# Patient Record
Sex: Male | Born: 1945 | Race: White | Hispanic: No | Marital: Married | Smoking: Never smoker
Health system: Southern US, Community
[De-identification: ages and names within clinical notes are randomized; demographics above are authoritative.]

## PROBLEM LIST (undated history)

## (undated) DIAGNOSIS — I10 Essential (primary) hypertension: Secondary | ICD-10-CM

## (undated) DIAGNOSIS — I5042 Chronic combined systolic (congestive) and diastolic (congestive) heart failure: Secondary | ICD-10-CM

## (undated) DIAGNOSIS — I255 Ischemic cardiomyopathy: Secondary | ICD-10-CM

## (undated) DIAGNOSIS — N289 Disorder of kidney and ureter, unspecified: Secondary | ICD-10-CM

## (undated) DIAGNOSIS — I219 Acute myocardial infarction, unspecified: Secondary | ICD-10-CM

## (undated) HISTORY — PX: TONSILLECTOMY: SUR1361

## (undated) HISTORY — DX: Ischemic cardiomyopathy: I25.5

## (undated) HISTORY — DX: Chronic combined systolic (congestive) and diastolic (congestive) heart failure: I50.42

---

## 2004-01-14 ENCOUNTER — Ambulatory Visit (HOSPITAL_COMMUNITY): Admission: RE | Admit: 2004-01-14 | Discharge: 2004-01-14 | Payer: Self-pay | Admitting: Gastroenterology

## 2009-09-14 ENCOUNTER — Encounter: Admission: RE | Admit: 2009-09-14 | Discharge: 2009-09-14 | Payer: Self-pay | Admitting: Endocrinology

## 2009-10-23 ENCOUNTER — Emergency Department (HOSPITAL_COMMUNITY): Admission: EM | Admit: 2009-10-23 | Discharge: 2009-10-23 | Payer: Self-pay | Admitting: Emergency Medicine

## 2010-09-19 ENCOUNTER — Ambulatory Visit
Admission: RE | Admit: 2010-09-19 | Discharge: 2010-09-19 | Disposition: A | Discharge status: Home / Self Care | Payer: 59 | Source: Ambulatory Visit | Attending: Endocrinology | Admitting: Endocrinology

## 2010-09-19 ENCOUNTER — Other Ambulatory Visit: Payer: Self-pay | Admitting: Endocrinology

## 2010-09-19 DIAGNOSIS — I10 Essential (primary) hypertension: Secondary | ICD-10-CM

## 2010-10-12 LAB — DIFFERENTIAL
Eosinophils Absolute: 0 10*3/uL (ref 0.0–0.7)
Eosinophils Relative: 1 % (ref 0–5)
Lymphocytes Relative: 17 % (ref 12–46)
Monocytes Absolute: 0.5 10*3/uL (ref 0.1–1.0)
Neutro Abs: 5.7 10*3/uL (ref 1.7–7.7)

## 2010-10-12 LAB — URINE MICROSCOPIC-ADD ON

## 2010-10-12 LAB — POCT I-STAT, CHEM 8
Calcium, Ion: 1.12 mmol/L (ref 1.12–1.32)
Glucose, Bld: 117 mg/dL — ABNORMAL HIGH (ref 70–99)
HCT: 39 % (ref 39.0–52.0)
Hemoglobin: 13.3 g/dL (ref 13.0–17.0)
TCO2: 23 mmol/L (ref 0–100)

## 2010-10-12 LAB — URINALYSIS, ROUTINE W REFLEX MICROSCOPIC
Glucose, UA: NEGATIVE mg/dL
Ketones, ur: NEGATIVE mg/dL
Leukocytes, UA: NEGATIVE
Nitrite: NEGATIVE
Urobilinogen, UA: 0.2 mg/dL (ref 0.0–1.0)
pH: 5.5 (ref 5.0–8.0)

## 2010-10-12 LAB — CBC
Platelets: 211 10*3/uL (ref 150–400)
WBC: 7.6 10*3/uL (ref 4.0–10.5)

## 2010-12-09 NOTE — Op Note (Signed)
NAME:  Anthony Vasquez, WORDELL NO.:  [id number]   MEDICAL RECORD NO.:  [id number]                   PATIENT TYPE:  AMB   LOCATION:  ENDO                                 FACILITY:  [facility]   PHYSICIAN:  Graylin Shiver, M.D.                DATE OF BIRTH:  May 11, 1946   DATE OF PROCEDURE:  01/14/2004  DATE OF DISCHARGE:                                 OPERATIVE REPORT   PROCEDURE PERFORMED:  Colonoscopy.   INDICATIONS FOR PROCEDURE:  Screening.   Informed consent was obtained after explanation of the risks of bleeding,  infection, and perforation.   PREMEDICATIONS:  Fentanyl 70 mcg  IV, Versed 7 mg IV.   DESCRIPTION OF PROCEDURE:  With the patient in the left lateral decubitus  position, a rectal exam was performed and no masses were felt.  The Olympus  colonoscope was inserted into the rectum and advanced around the colon to  the cecum.  Cecal landmarks were identified.  The cecum and ascending colon  were normal.  The transverse colon normal.  The descending colon, sigmoid  and rectum were normal.  The patient tolerated the procedure well without  complications.   IMPRESSION:  Normal colonoscopy to the cecum.   RECOMMENDATIONS:  I would recommend a follow-up screening colonoscopy again  in 10 years.                                               Graylin Shiver, M.D.    Germain Osgood  D:  01/14/2004  T:  01/15/2004  Job:  [id number]   cc:   Veverly Fells. Altheimer, M.D.  [address]  [place]  [place] [address]  Fax: [phone]

## 2012-05-03 ENCOUNTER — Other Ambulatory Visit (HOSPITAL_COMMUNITY): Payer: Self-pay | Admitting: Urology

## 2012-05-03 DIAGNOSIS — N281 Cyst of kidney, acquired: Secondary | ICD-10-CM

## 2012-07-02 ENCOUNTER — Ambulatory Visit (HOSPITAL_COMMUNITY)
Admission: RE | Admit: 2012-07-02 | Discharge: 2012-07-02 | Disposition: A | Discharge status: Home / Self Care | Payer: Medicare Other | Source: Ambulatory Visit | Attending: Urology | Admitting: Urology

## 2012-07-02 DIAGNOSIS — Z87442 Personal history of urinary calculi: Secondary | ICD-10-CM | POA: Insufficient documentation

## 2012-07-02 DIAGNOSIS — N281 Cyst of kidney, acquired: Secondary | ICD-10-CM | POA: Insufficient documentation

## 2012-07-02 DIAGNOSIS — N289 Disorder of kidney and ureter, unspecified: Secondary | ICD-10-CM | POA: Insufficient documentation

## 2012-07-02 DIAGNOSIS — K7689 Other specified diseases of liver: Secondary | ICD-10-CM | POA: Insufficient documentation

## 2012-07-02 LAB — CREATININE, SERUM
Creatinine, Ser: 1.2 mg/dL (ref 0.50–1.35)
GFR calc non Af Amer: 61 mL/min — ABNORMAL LOW (ref >90)

## 2012-07-02 MED ORDER — GADOBENATE DIMEGLUMINE 529 MG/ML IV SOLN
14.0000 mL | Freq: Once | INTRAVENOUS | Status: AC | PRN
  Administered 2012-07-02: 14 mL via INTRAVENOUS

## 2013-07-24 HISTORY — PX: COLONOSCOPY: SHX174

## 2014-09-22 DIAGNOSIS — I219 Acute myocardial infarction, unspecified: Secondary | ICD-10-CM

## 2014-09-22 HISTORY — DX: Acute myocardial infarction, unspecified: I21.9

## 2014-10-07 ENCOUNTER — Inpatient Hospital Stay (HOSPITAL_COMMUNITY)
Admission: EM | Admit: 2014-10-07 | Discharge: 2014-10-16 | DRG: 216 | Disposition: A | Discharge status: Home / Self Care | Payer: Medicare Other | Attending: Cardiothoracic Surgery | Admitting: Cardiothoracic Surgery

## 2014-10-07 ENCOUNTER — Encounter (HOSPITAL_COMMUNITY): Payer: Self-pay | Admitting: Emergency Medicine

## 2014-10-07 ENCOUNTER — Emergency Department (HOSPITAL_COMMUNITY): Payer: Medicare Other

## 2014-10-07 DIAGNOSIS — Z8249 Family history of ischemic heart disease and other diseases of the circulatory system: Secondary | ICD-10-CM

## 2014-10-07 DIAGNOSIS — Z09 Encounter for follow-up examination after completed treatment for conditions other than malignant neoplasm: Secondary | ICD-10-CM

## 2014-10-07 DIAGNOSIS — Z951 Presence of aortocoronary bypass graft: Secondary | ICD-10-CM

## 2014-10-07 DIAGNOSIS — I5042 Chronic combined systolic (congestive) and diastolic (congestive) heart failure: Secondary | ICD-10-CM | POA: Insufficient documentation

## 2014-10-07 DIAGNOSIS — E119 Type 2 diabetes mellitus without complications: Secondary | ICD-10-CM | POA: Diagnosis present

## 2014-10-07 DIAGNOSIS — I25110 Atherosclerotic heart disease of native coronary artery with unstable angina pectoris: Secondary | ICD-10-CM | POA: Diagnosis not present

## 2014-10-07 DIAGNOSIS — N183 Chronic kidney disease, stage 3 (moderate): Secondary | ICD-10-CM | POA: Diagnosis present

## 2014-10-07 DIAGNOSIS — D6959 Other secondary thrombocytopenia: Secondary | ICD-10-CM | POA: Diagnosis not present

## 2014-10-07 DIAGNOSIS — I2510 Atherosclerotic heart disease of native coronary artery without angina pectoris: Secondary | ICD-10-CM | POA: Diagnosis present

## 2014-10-07 DIAGNOSIS — Z79899 Other long term (current) drug therapy: Secondary | ICD-10-CM | POA: Diagnosis not present

## 2014-10-07 DIAGNOSIS — D689 Coagulation defect, unspecified: Secondary | ICD-10-CM | POA: Diagnosis present

## 2014-10-07 DIAGNOSIS — E785 Hyperlipidemia, unspecified: Secondary | ICD-10-CM | POA: Diagnosis present

## 2014-10-07 DIAGNOSIS — I129 Hypertensive chronic kidney disease with stage 1 through stage 4 chronic kidney disease, or unspecified chronic kidney disease: Secondary | ICD-10-CM | POA: Diagnosis present

## 2014-10-07 DIAGNOSIS — D62 Acute posthemorrhagic anemia: Secondary | ICD-10-CM | POA: Diagnosis not present

## 2014-10-07 DIAGNOSIS — I10 Essential (primary) hypertension: Secondary | ICD-10-CM | POA: Diagnosis present

## 2014-10-07 DIAGNOSIS — I214 Non-ST elevation (NSTEMI) myocardial infarction: Secondary | ICD-10-CM | POA: Diagnosis present

## 2014-10-07 DIAGNOSIS — R570 Cardiogenic shock: Secondary | ICD-10-CM | POA: Diagnosis present

## 2014-10-07 DIAGNOSIS — I5021 Acute systolic (congestive) heart failure: Secondary | ICD-10-CM | POA: Diagnosis present

## 2014-10-07 DIAGNOSIS — R079 Chest pain, unspecified: Secondary | ICD-10-CM

## 2014-10-07 HISTORY — DX: Disorder of kidney and ureter, unspecified: N28.9

## 2014-10-07 HISTORY — DX: Acute myocardial infarction, unspecified: I21.9

## 2014-10-07 HISTORY — DX: Essential (primary) hypertension: I10

## 2014-10-07 LAB — BASIC METABOLIC PANEL
Anion gap: 5 (ref 5–15)
BUN: 17 mg/dL (ref 6–23)
CHLORIDE: 110 mmol/L (ref 96–112)
CO2: 27 mmol/L (ref 19–32)
CREATININE: 1.41 mg/dL — AB (ref 0.50–1.35)
Calcium: 9.8 mg/dL (ref 8.4–10.5)
GFR calc Af Amer: 58 mL/min — ABNORMAL LOW (ref >90)
GFR calc non Af Amer: 50 mL/min — ABNORMAL LOW (ref >90)
GLUCOSE: 82 mg/dL (ref 70–99)
Potassium: 3.7 mmol/L (ref 3.5–5.1)
SODIUM: 142 mmol/L (ref 135–145)

## 2014-10-07 LAB — I-STAT TROPONIN, ED: Troponin i, poc: 1.97 ng/mL (ref 0.00–0.08)

## 2014-10-07 LAB — CBC
HEMATOCRIT: 41.7 % (ref 39.0–52.0)
Hemoglobin: 13.6 g/dL (ref 13.0–17.0)
MCH: 28.3 pg (ref 26.0–34.0)
MCHC: 32.6 g/dL (ref 30.0–36.0)
MCV: 86.7 fL (ref 78.0–100.0)
PLATELETS: 265 10*3/uL (ref 150–400)
RBC: 4.81 MIL/uL (ref 4.22–5.81)
RDW: 13.1 % (ref 11.5–15.5)
WBC: 7.7 10*3/uL (ref 4.0–10.5)

## 2014-10-07 LAB — PROTIME-INR
INR: 1.07 (ref 0.00–1.49)
Prothrombin Time: 14.1 seconds (ref 11.6–15.2)

## 2014-10-07 LAB — APTT: aPTT: 29 seconds (ref 24–37)

## 2014-10-07 MED ORDER — ASPIRIN 81 MG PO CHEW: 324.0000 mg | CHEWABLE_TABLET | ORAL | Status: DC

## 2014-10-07 MED ORDER — NITROGLYCERIN 0.4 MG SL SUBL: 0.4000 mg | SUBLINGUAL_TABLET | SUBLINGUAL | Status: DC | PRN

## 2014-10-07 MED ORDER — ONDANSETRON HCL 4 MG/2ML IJ SOLN
4.0000 mg | Freq: Four times a day (QID) | INTRAMUSCULAR | Status: DC | PRN
  Administered 2014-10-08: 4 mg via INTRAVENOUS
  Filled 2014-10-07: qty 2

## 2014-10-07 MED ORDER — HEPARIN BOLUS VIA INFUSION
4000.0000 [IU] | Freq: Once | INTRAVENOUS | Status: AC
  Administered 2014-10-07: 4000 [IU] via INTRAVENOUS
  Filled 2014-10-07: qty 4000

## 2014-10-07 MED ORDER — ASPIRIN 81 MG PO CHEW
324.0000 mg | CHEWABLE_TABLET | Freq: Once | ORAL | Status: AC
  Administered 2014-10-07: 324 mg via ORAL
  Filled 2014-10-07: qty 4

## 2014-10-07 MED ORDER — HEPARIN (PORCINE) IN NACL 100-0.45 UNIT/ML-% IJ SOLN
1050.0000 [IU]/h | INTRAMUSCULAR | Status: DC
  Administered 2014-10-07: 850 [IU]/h via INTRAVENOUS
  Filled 2014-10-07 (×2): qty 250

## 2014-10-07 MED ORDER — ASPIRIN 300 MG RE SUPP: 300.0000 mg | RECTAL | Status: DC

## 2014-10-07 MED ORDER — ASPIRIN EC 81 MG PO TBEC
81.0000 mg | DELAYED_RELEASE_TABLET | Freq: Every day | ORAL | Status: DC
  Filled 2014-10-07: qty 1

## 2014-10-07 MED ORDER — METOPROLOL TARTRATE 12.5 MG HALF TABLET
12.5000 mg | ORAL_TABLET | Freq: Two times a day (BID) | ORAL | Status: DC
  Filled 2014-10-07 (×2): qty 1

## 2014-10-07 MED ORDER — ATORVASTATIN CALCIUM 80 MG PO TABS
80.0000 mg | ORAL_TABLET | Freq: Every day | ORAL | Status: DC
  Administered 2014-10-07 – 2014-10-12 (×4): 80 mg via ORAL
  Filled 2014-10-07 (×7): qty 1

## 2014-10-07 MED ORDER — SODIUM CHLORIDE 0.9 % IV SOLN
INTRAVENOUS | Status: AC
  Administered 2014-10-07 – 2014-10-08 (×2): via INTRAVENOUS

## 2014-10-07 MED ORDER — ACETAMINOPHEN 325 MG PO TABS: 650.0000 mg | ORAL_TABLET | ORAL | Status: DC | PRN

## 2014-10-07 NOTE — ED Notes (Signed)
Patient rates chest pain 1/10 currently and states, "It's like a weight laying there."

## 2014-10-07 NOTE — H&P (Signed)
Anthony Vasquez is an 69 y.o. male.    Chief Complaint: chest pain Primary Cardiologist: new HPI: Anthony Vasquez is a 69 yo man with PMH of hypertension and dyslipidemia who presents with ~ 1 week of increased exercise induced discomfort found to have elevated troponin in the ER consistent with NSTEMI. He actually dates his symptoms to mid February 2016. Anthony Vasquez has noted chest pressure with radiation to his left arm while working in the yard a few times this past week leading to presentation. He currently is chest pain free but presented with some mild light-headedness. In the ER he received a large aspirin and was initiated on a heparin gtt. He denies recent illness. No upcoming surgeries. No bleeding issues.   Past Medical History  Diagnosis Date  . Hypertension   . Renal disorder     History reviewed. No pertinent past surgical history. Hypertension, Dyslipidemia No family history on file. Social History:  reports that he has never smoked. He does not have any smokeless tobacco history on file. He reports that he does not drink alcohol. His drug history is not on file. Mother with CHF, died in her 72s, father with known CAD, alive in 63s Allergies: No Known Allergies   (Not in a hospital admission)  Results for orders placed or performed during the hospital encounter of 10/07/14 (from the past 48 hour(s))  CBC     Status: None   Collection Time: 10/07/14  5:18 PM  Result Value Ref Range   WBC 7.7 4.0 - 10.5 K/uL   RBC 4.81 4.22 - 5.81 MIL/uL   Hemoglobin 13.6 13.0 - 17.0 g/dL   HCT 41.7 39.0 - 52.0 %   MCV 86.7 78.0 - 100.0 fL   MCH 28.3 26.0 - 34.0 pg   MCHC 32.6 30.0 - 36.0 g/dL   RDW 13.1 11.5 - 15.5 %   Platelets 265 150 - 400 K/uL  Basic metabolic panel     Status: Abnormal   Collection Time: 10/07/14  5:18 PM  Result Value Ref Range   Sodium 142 135 - 145 mmol/L   Potassium 3.7 3.5 - 5.1 mmol/L   Chloride 110 96 - 112 mmol/L   CO2 27 19 - 32 mmol/L   Glucose,  Bld 82 70 - 99 mg/dL   BUN 17 6 - 23 mg/dL   Creatinine, Ser 1.41 (H) 0.50 - 1.35 mg/dL   Calcium 9.8 8.4 - 10.5 mg/dL   GFR calc non Af Amer 50 (L) >90 mL/min   GFR calc Af Amer 58 (L) >90 mL/min    Comment: (NOTE) The eGFR has been calculated using the CKD EPI equation. This calculation has not been validated in all clinical situations. eGFR's persistently <90 mL/min signify possible Chronic Kidney Disease.    Anion gap 5 5 - 15  I-stat troponin, ED (not at [facility])     Status: Abnormal   Collection Time: 10/07/14  5:41 PM  Result Value Ref Range   Troponin i, poc 1.97 (HH) 0.00 - 0.08 ng/mL   Comment NOTIFIED PHYSICIAN    Comment 3            Comment: Due to the release kinetics of cTnI, a negative result within the first hours of the onset of symptoms does not rule out myocardial infarction with certainty. If myocardial infarction is still suspected, repeat the test at appropriate intervals.    Dg Chest 2 View  10/07/2014   CLINICAL DATA:  Four day  history of dizziness and chest pain  EXAM: CHEST  2 VIEW  COMPARISON:  September 19, 2010  FINDINGS: There is minimal scarring left base. There is no edema or consolidation. Heart size and pulmonary vascularity are normal. No adenopathy. No pneumothorax. No bone lesions.  IMPRESSION: Minimal scarring left base.  No edema or consolidation.   Electronically Signed   By: Lowella Grip III M.D.   On: 10/07/2014 18:44    Review of Systems  Constitutional: Negative for fever, chills and weight loss.  HENT: Negative for ear discharge.   Eyes: Negative for double vision and pain.  Respiratory: Negative for cough and hemoptysis.   Cardiovascular: Positive for chest pain. Negative for palpitations and orthopnea.  Gastrointestinal: Negative for nausea and vomiting.  Genitourinary: Negative for dysuria and urgency.  Musculoskeletal: Negative for myalgias, back pain and neck pain.  Skin: Negative for rash.  Neurological: Positive for  dizziness. Negative for sensory change, speech change and headaches.  Endo/Heme/Allergies: Negative for polydipsia. Does not bruise/bleed easily.  Psychiatric/Behavioral: Negative for depression, suicidal ideas and substance abuse.    Blood pressure 109/76, pulse 65, temperature 98.6 F (37 C), temperature source Oral, resp. rate 16, height $RemoveBe'5\' 6"'yiUZaCtPL$  (1.676 m), weight 70.308 kg (155 lb), SpO2 93 %. Physical Exam  Nursing note and vitals reviewed. Constitutional: He is oriented to person, place, and time. He appears well-developed and well-nourished. No distress.  HENT:  Head: Normocephalic and atraumatic.  Nose: Nose normal.  Mouth/Throat: Oropharynx is clear and moist. No oropharyngeal exudate.  Eyes: Conjunctivae and EOM are normal. Pupils are equal, round, and reactive to light. No scleral icterus.  Neck: Normal range of motion. Neck supple. No JVD present. No tracheal deviation present.  Cardiovascular: Normal rate, regular rhythm, normal heart sounds and intact distal pulses.  Exam reveals no gallop.   No murmur heard. Respiratory: Effort normal and breath sounds normal. No respiratory distress. He has no wheezes. He has no rales.  GI: Soft. Bowel sounds are normal. He exhibits no distension. There is no tenderness. There is no rebound.  Musculoskeletal: Normal range of motion. He exhibits no edema or tenderness.  Neurological: He is alert and oriented to person, place, and time. No cranial nerve deficit. Coordination normal.  Skin: Skin is warm and dry. No rash noted. He is not diaphoretic. No erythema.  Psychiatric: He has a normal mood and affect. His behavior is normal. Thought content normal.    EKG reviewed; sinus rhythm, anterior infarct - age indeterminate, inferolateral ST depression consistent with ischemia Labs reviewed; Cr 1.4, Trop 1.97 Chest x-ray: no acute process Assessment/Plan Problem List Chest Pain/NSTEMI Hypertension Dyslipidemia Mildly elevated creatinine Mr.  Vasquez is a 69 yo man with HTN, HLD here with chest discomfort and found to have likely NSTEMI. Differential diagnosis is musculoskeletal pain, esophageal spasm, GERD, pericarditis, ACS/NSTEMI among other etiologies. I favor a diagnosis of type I NSTEMI and plan for LHC in AM with likely need for PCI.  - NPO after MN - trend cardiac markers - observation on telemetry - asa 81 mg, metoprolol 12.5 mg bid, atorvastatin first dose now 80 mg - heparin gtt - hba1c, tsh, lipid panel, BNP - 75 ml NS/hr x 1L   Santiana Glidden 10/07/2014, 9:04 PM

## 2014-10-07 NOTE — ED Notes (Signed)
Dr Ethelda Chick notified of patient lab result.

## 2014-10-07 NOTE — ED Provider Notes (Signed)
CSN: [id number]     Arrival date & time 10/07/14  1704 History   First MD Initiated Contact with Patient 10/07/14 1758     Chief Complaint  Patient presents with  . Chest Pain  . Dizziness     (Consider location/radiation/quality/duration/timing/severity/associated sxs/prior Treatment) HPI Complains of anterior chest pain rating to left arm onset while doing yard work 4 days ago and again 2 days ago. This morning he complains of mild lightheadedness. He is presently asymptomatic except for mild lightheadedness. No other associated symptoms. No treatment prior to coming here. Symptoms worse with exertion and improved with rest Past Medical History  Diagnosis Date  . Renal disorder   . Hypertension    hypercholesterolemia History reviewed. No pertinent past surgical history. No family history on file. History  Substance Use Topics  . Smoking status: Never Smoker   . Smokeless tobacco: Not on file  . Alcohol Use: No    Review of Systems  Constitutional: Negative.   HENT: Negative.   Respiratory: Negative.   Cardiovascular: Positive for chest pain.  Gastrointestinal: Negative.   Musculoskeletal: Negative.   Skin: Negative.   Neurological: Positive for light-headedness.  Psychiatric/Behavioral: Negative.   All other systems reviewed and are negative.     Allergies  Review of patient's allergies indicates not on file.  Home Medications   Prior to Admission medications   Not on File   BP 120/81 mmHg  Pulse 74  Temp(Src) 98.6 F (37 C) (Oral)  Resp 18  SpO2 98% Physical Exam  Constitutional: He appears well-developed and well-nourished.  HENT:  Head: Normocephalic and atraumatic.  Eyes: Conjunctivae are normal. Pupils are equal, round, and reactive to light.  Neck: Neck supple. No tracheal deviation present. No thyromegaly present.  Cardiovascular: Normal rate and regular rhythm.   No murmur heard. Pulmonary/Chest: Effort normal and breath sounds normal.   Abdominal: Soft. Bowel sounds are normal. He exhibits no distension. There is no tenderness.  Musculoskeletal: Normal range of motion. He exhibits no edema or tenderness.  Neurological: He is alert. Coordination normal.  Skin: Skin is warm and dry. No rash noted.  Psychiatric: He has a normal mood and affect.  Nursing note and vitals reviewed.   ED Course  Procedures (including critical care time) Labs Review Labs Reviewed  I-STAT TROPOININ, ED - Abnormal; Notable for the following:    Troponin i, poc 1.97 (*)    All other components within normal limits  CBC  BASIC METABOLIC PANEL    Imaging Review No results found.   EKG Interpretation   Date/Time:  Wednesday October 07 2014 17:13:29 EDT Ventricular Rate:  74 PR Interval:  173 QRS Duration: 94 QT Interval:  408 QTC Calculation: 453 R Axis:   -2 Text Interpretation:  Sinus rhythm LVH with secondary repolarization  abnormality Anterior infarct, old No old tracing to compare Confirmed by  Idalie Canto  MD, Baylon Santelli [phone]) on 10/07/2014 5:57:41 PM     8:50 PM patient remains asymptomatic, pain-free. Chest xray viewed by me Results for orders placed or performed during the hospital encounter of 10/07/14  CBC  Result Value Ref Range   WBC 7.7 4.0 - 10.5 K/uL   RBC 4.81 4.22 - 5.81 MIL/uL   Hemoglobin 13.6 13.0 - 17.0 g/dL   HCT 19.1 47.8 - 29.5 %   MCV 86.7 78.0 - 100.0 fL   MCH 28.3 26.0 - 34.0 pg   MCHC 32.6 30.0 - 36.0 g/dL   RDW 62.1 30.8 - 65.7 %  Platelets 265 150 - 400 K/uL  Basic metabolic panel  Result Value Ref Range   Sodium 142 135 - 145 mmol/L   Potassium 3.7 3.5 - 5.1 mmol/L   Chloride 110 96 - 112 mmol/L   CO2 27 19 - 32 mmol/L   Glucose, Bld 82 70 - 99 mg/dL   BUN 17 6 - 23 mg/dL   Creatinine, Ser 9.79 (H) 0.50 - 1.35 mg/dL   Calcium 9.8 8.4 - 48.0 mg/dL   GFR calc non Af Amer 50 (L) >90 mL/min   GFR calc Af Amer 58 (L) >90 mL/min   Anion gap 5 5 - 15  I-stat troponin, ED (not at [facility])  Result  Value Ref Range   Troponin i, poc 1.97 (HH) 0.00 - 0.08 ng/mL   Comment NOTIFIED PHYSICIAN    Comment 3           Dg Chest 2 View  10/07/2014   CLINICAL DATA:  Four day history of dizziness and chest pain  EXAM: CHEST  2 VIEW  COMPARISON:  September 19, 2010  FINDINGS: There is minimal scarring left base. There is no edema or consolidation. Heart size and pulmonary vascularity are normal. No adenopathy. No pneumothorax. No bone lesions.  IMPRESSION: Minimal scarring left base.  No edema or consolidation.   Electronically Signed   By: Bretta Bang III M.D.   On: 10/07/2014 18:44    MDM  Spoke with Dr.Kelly. Plan transfer [facility] telemetry unit. Intravenous heparin patient minister aspirin Final diagnoses:  Chest pain   diagnosis # NSTEMI #2 renal insufficiency      Doug Sou, MD 10/07/14 1655

## 2014-10-07 NOTE — ED Notes (Signed)
Bed: WA23 Expected date:  Expected time:  Means of arrival:  Comments: tr5 

## 2014-10-07 NOTE — ED Notes (Signed)
Patient presents to the ED from home with complaints of chest pain intermittent since February, worsening for 4 days.  Patient states exertion makes the pain worse and rest makes pain better.  Patient states he has also been intermittently dizzy since Monday.  Patient states any form of exertion increases substernal chest pain.  Patient denies SOB.  Patient endorses some mild nausea today, but denies vomiting.  Patient rates pain 1.5/10 currently.  Patient states he "just doesn't feel right."  On exam, patients lung sounds are clear in all lobes, no wheezing, rhonchi or rales.  Heart sounds WNL, S1/S2, RR, no gallop, murmur or rub.  +3 radial and pedal pulses.  No pre-tibial and pedal edema.  Patients abdomen is soft and non-tender to palpation.  Bowel sounds present.  CNIII and EOM intact.  Skin warm and dry.

## 2014-10-07 NOTE — ED Notes (Signed)
Dr Shela Commons at bedside, pt nonsymptomatic, pt denies chest pain today. Reports last time he had chest pain was Monday.

## 2014-10-07 NOTE — Progress Notes (Signed)
ANTICOAGULATION CONSULT NOTE - Initial Consult  Pharmacy Consult for Heparin Indication: chest pain/ACS  No Known Allergies  Patient Measurements: Height: 5\' 6"  (167.6 cm) Weight: 155 lb (70.308 kg) IBW/kg (Calculated) : 63.8 Heparin Dosing Weight: actual weight  Vital Signs: Temp: 98.6 F (37 C) (03/16 1710) Temp Source: Oral (03/16 1710) BP: 127/76 mmHg (03/16 2000) Pulse Rate: 73 (03/16 2000)  Labs:  Recent Labs  10/07/14 1718  HGB 13.6  HCT 41.7  PLT 265  CREATININE 1.41*    CrCl cannot be calculated (Unknown ideal weight.).   Medical History: Past Medical History  Diagnosis Date  . Hypertension   . Renal disorder     Medications:  Scheduled:  Infusions:   Assessment: 31 yoM [facility] ED for dizziness and exertional chest pain with concern for ACS.  PMH is significant for renal d/o, HTN, HLD.  Pharmacy is consulted to dose IV heparin for NSTEMI.   Baseline coags: pending collection  Troponin: 1.97  CBC: Hgb 13.6, Plt 265   Goal of Therapy:  Heparin level 0.3-0.7 units/ml Monitor platelets by anticoagulation protocol: Yes   Plan:   Baseline PTT, PT/INR  Give heparin 4000 units bolus IV x 1  Start heparin IV infusion at 850 units/hr  Heparin level 6 hours after starting  Daily heparin level and CBC  Lynann Beaver PharmD, BCPS Pager [phone] 10/07/2014 8:50 PM

## 2014-10-07 NOTE — ED Notes (Signed)
Pt c/o intermittent chest pain and dizziness since Saturday. Pt sts the pain is worse when doing any physical activity. When he has noticed it the most, he has been outside working in the yard. Pt sts this chest pain has been going on for "a few months."  Pt denies pain radiation at this time but sts that over the past few months it has radiated to the jaw and left arm. 1.5/10 pain at this time.

## 2014-10-08 ENCOUNTER — Inpatient Hospital Stay (HOSPITAL_COMMUNITY): Payer: Medicare Other | Admitting: Certified Registered Nurse Anesthetist

## 2014-10-08 ENCOUNTER — Encounter (HOSPITAL_COMMUNITY)
Admission: EM | Disposition: A | Discharge status: Home / Self Care | Payer: Self-pay | Source: Home / Self Care | Attending: Cardiothoracic Surgery

## 2014-10-08 ENCOUNTER — Encounter (HOSPITAL_COMMUNITY): Payer: Self-pay | Admitting: General Practice

## 2014-10-08 ENCOUNTER — Encounter (HOSPITAL_COMMUNITY)
Admission: EM | Disposition: A | Discharge status: Home / Self Care | Source: Home / Self Care | Attending: Cardiothoracic Surgery

## 2014-10-08 DIAGNOSIS — R570 Cardiogenic shock: Secondary | ICD-10-CM | POA: Insufficient documentation

## 2014-10-08 DIAGNOSIS — I2510 Atherosclerotic heart disease of native coronary artery without angina pectoris: Secondary | ICD-10-CM

## 2014-10-08 DIAGNOSIS — I214 Non-ST elevation (NSTEMI) myocardial infarction: Principal | ICD-10-CM

## 2014-10-08 DIAGNOSIS — I5042 Chronic combined systolic (congestive) and diastolic (congestive) heart failure: Secondary | ICD-10-CM | POA: Insufficient documentation

## 2014-10-08 DIAGNOSIS — I25110 Atherosclerotic heart disease of native coronary artery with unstable angina pectoris: Secondary | ICD-10-CM

## 2014-10-08 DIAGNOSIS — I5021 Acute systolic (congestive) heart failure: Secondary | ICD-10-CM | POA: Insufficient documentation

## 2014-10-08 HISTORY — PX: LEFT HEART CATHETERIZATION WITH CORONARY ANGIOGRAM: SHX5451

## 2014-10-08 HISTORY — PX: TEE WITHOUT CARDIOVERSION: SHX5443

## 2014-10-08 HISTORY — PX: CORONARY ARTERY BYPASS GRAFT: SHX141

## 2014-10-08 LAB — POCT I-STAT, CHEM 8
BUN: 16 mg/dL (ref 6–23)
BUN: 11 mg/dL (ref 6–23)
BUN: 13 mg/dL (ref 6–23)
BUN: 13 mg/dL (ref 6–23)
BUN: 12 mg/dL (ref 6–23)
BUN: 13 mg/dL (ref 6–23)
BUN: 13 mg/dL (ref 6–23)
BUN: 12 mg/dL (ref 6–23)
CALCIUM ION: 1.13 mmol/L (ref 1.13–1.30)
CALCIUM ION: 0.95 mmol/L — AB (ref 1.13–1.30)
CALCIUM ION: 0.96 mmol/L — AB (ref 1.13–1.30)
CALCIUM ION: 1.03 mmol/L — AB (ref 1.13–1.30)
CHLORIDE: 107 mmol/L (ref 96–112)
CHLORIDE: 99 mmol/L (ref 96–112)
CHLORIDE: 101 mmol/L (ref 96–112)
CREATININE: 1.3 mg/dL (ref 0.50–1.35)
CREATININE: 1.2 mg/dL (ref 0.50–1.35)
CREATININE: 1.2 mg/dL (ref 0.50–1.35)
CREATININE: 1.2 mg/dL (ref 0.50–1.35)
Calcium, Ion: 1.23 mmol/L (ref 1.13–1.30)
Calcium, Ion: 1.36 mmol/L — ABNORMAL HIGH (ref 1.13–1.30)
Calcium, Ion: 0.89 mmol/L — ABNORMAL LOW (ref 1.13–1.30)
Calcium, Ion: 1.08 mmol/L — ABNORMAL LOW (ref 1.13–1.30)
Chloride: 106 mmol/L (ref 96–112)
Chloride: 108 mmol/L (ref 96–112)
Chloride: 98 mmol/L (ref 96–112)
Chloride: 99 mmol/L (ref 96–112)
Chloride: 101 mmol/L (ref 96–112)
Creatinine, Ser: 1 mg/dL (ref 0.50–1.35)
Creatinine, Ser: 1.2 mg/dL (ref 0.50–1.35)
Creatinine, Ser: 1.2 mg/dL (ref 0.50–1.35)
Creatinine, Ser: 1.2 mg/dL (ref 0.50–1.35)
GLUCOSE: 287 mg/dL — AB (ref 70–99)
Glucose, Bld: 146 mg/dL — ABNORMAL HIGH (ref 70–99)
Glucose, Bld: 139 mg/dL — ABNORMAL HIGH (ref 70–99)
Glucose, Bld: 146 mg/dL — ABNORMAL HIGH (ref 70–99)
Glucose, Bld: 150 mg/dL — ABNORMAL HIGH (ref 70–99)
Glucose, Bld: 313 mg/dL — ABNORMAL HIGH (ref 70–99)
Glucose, Bld: 337 mg/dL — ABNORMAL HIGH (ref 70–99)
Glucose, Bld: 348 mg/dL — ABNORMAL HIGH (ref 70–99)
HCT: 17 % — ABNORMAL LOW (ref 39.0–52.0)
HCT: 28 % — ABNORMAL LOW (ref 39.0–52.0)
HCT: 31 % — ABNORMAL LOW (ref 39.0–52.0)
HCT: 22 % — ABNORMAL LOW (ref 39.0–52.0)
HCT: 25 % — ABNORMAL LOW (ref 39.0–52.0)
HCT: 27 % — ABNORMAL LOW (ref 39.0–52.0)
HEMATOCRIT: 41 % (ref 39.0–52.0)
HEMATOCRIT: 22 % — AB (ref 39.0–52.0)
Hemoglobin: 13.9 g/dL (ref 13.0–17.0)
Hemoglobin: 10.5 g/dL — ABNORMAL LOW (ref 13.0–17.0)
Hemoglobin: 5.8 g/dL — CL (ref 13.0–17.0)
Hemoglobin: 9.5 g/dL — ABNORMAL LOW (ref 13.0–17.0)
Hemoglobin: 7.5 g/dL — ABNORMAL LOW (ref 13.0–17.0)
Hemoglobin: 7.5 g/dL — ABNORMAL LOW (ref 13.0–17.0)
Hemoglobin: 8.5 g/dL — ABNORMAL LOW (ref 13.0–17.0)
Hemoglobin: 9.2 g/dL — ABNORMAL LOW (ref 13.0–17.0)
POTASSIUM: 3.7 mmol/L (ref 3.5–5.1)
POTASSIUM: 3.3 mmol/L — AB (ref 3.5–5.1)
POTASSIUM: 3.6 mmol/L (ref 3.5–5.1)
POTASSIUM: 4.9 mmol/L (ref 3.5–5.1)
POTASSIUM: 3.5 mmol/L (ref 3.5–5.1)
Potassium: 3.8 mmol/L (ref 3.5–5.1)
Potassium: 3.9 mmol/L (ref 3.5–5.1)
Potassium: 4.7 mmol/L (ref 3.5–5.1)
SODIUM: 137 mmol/L (ref 135–145)
SODIUM: 143 mmol/L (ref 135–145)
SODIUM: 136 mmol/L (ref 135–145)
Sodium: 143 mmol/L (ref 135–145)
Sodium: 142 mmol/L (ref 135–145)
Sodium: 133 mmol/L — ABNORMAL LOW (ref 135–145)
Sodium: 134 mmol/L — ABNORMAL LOW (ref 135–145)
Sodium: 140 mmol/L (ref 135–145)
TCO2: 20 mmol/L (ref 0–100)
TCO2: 19 mmol/L (ref 0–100)
TCO2: 19 mmol/L (ref 0–100)
TCO2: 20 mmol/L (ref 0–100)
TCO2: 17 mmol/L (ref 0–100)
TCO2: 17 mmol/L (ref 0–100)
TCO2: 20 mmol/L (ref 0–100)
TCO2: 19 mmol/L (ref 0–100)

## 2014-10-08 LAB — ABO/RH: ABO/RH(D): A POS

## 2014-10-08 LAB — BASIC METABOLIC PANEL
Anion gap: 11 (ref 5–15)
BUN: 13 mg/dL (ref 6–23)
CO2: 21 mmol/L (ref 19–32)
Calcium: 8.8 mg/dL (ref 8.4–10.5)
Chloride: 110 mmol/L (ref 96–112)
Creatinine, Ser: 1.3 mg/dL (ref 0.50–1.35)
GFR, EST AFRICAN AMERICAN: 64 mL/min — AB (ref >90)
GFR, EST NON AFRICAN AMERICAN: 55 mL/min — AB (ref >90)
Glucose, Bld: 92 mg/dL (ref 70–99)
Potassium: 3.5 mmol/L (ref 3.5–5.1)
SODIUM: 142 mmol/L (ref 135–145)

## 2014-10-08 LAB — COMPREHENSIVE METABOLIC PANEL
ALK PHOS: 51 U/L (ref 39–117)
ALT: 24 U/L (ref 0–53)
AST: 33 U/L (ref 0–37)
Albumin: 3.5 g/dL (ref 3.5–5.2)
Anion gap: 8 (ref 5–15)
BUN: 12 mg/dL (ref 6–23)
CO2: 24 mmol/L (ref 19–32)
CREATININE: 1.38 mg/dL — AB (ref 0.50–1.35)
Calcium: 9.1 mg/dL (ref 8.4–10.5)
Chloride: 111 mmol/L (ref 96–112)
GFR, EST AFRICAN AMERICAN: 59 mL/min — AB (ref >90)
GFR, EST NON AFRICAN AMERICAN: 51 mL/min — AB (ref >90)
GLUCOSE: 91 mg/dL (ref 70–99)
POTASSIUM: 3.5 mmol/L (ref 3.5–5.1)
SODIUM: 143 mmol/L (ref 135–145)
TOTAL PROTEIN: 6.1 g/dL (ref 6.0–8.3)
Total Bilirubin: 0.8 mg/dL (ref 0.3–1.2)

## 2014-10-08 LAB — CBC
HCT: 39.3 % (ref 39.0–52.0)
Hemoglobin: 13 g/dL (ref 13.0–17.0)
MCH: 28.2 pg (ref 26.0–34.0)
MCHC: 33.1 g/dL (ref 30.0–36.0)
MCV: 85.2 fL (ref 78.0–100.0)
PLATELETS: 229 10*3/uL (ref 150–400)
RBC: 4.61 MIL/uL (ref 4.22–5.81)
RDW: 13.2 % (ref 11.5–15.5)
WBC: 8.5 10*3/uL (ref 4.0–10.5)

## 2014-10-08 LAB — POCT I-STAT 3, ART BLOOD GAS (G3+)
ACID-BASE DEFICIT: 3 mmol/L — AB (ref 0.0–2.0)
ACID-BASE DEFICIT: 8 mmol/L — AB (ref 0.0–2.0)
Acid-base deficit: 4 mmol/L — ABNORMAL HIGH (ref 0.0–2.0)
Bicarbonate: 22.6 mEq/L (ref 20.0–24.0)
Bicarbonate: 18.6 mEq/L — ABNORMAL LOW (ref 20.0–24.0)
Bicarbonate: 21.9 mEq/L (ref 20.0–24.0)
O2 SAT: 100 %
O2 SAT: 96 %
O2 Saturation: 95 %
PH ART: 7.35 (ref 7.350–7.450)
PH ART: 7.269 — AB (ref 7.350–7.450)
TCO2: 24 mmol/L (ref 0–100)
TCO2: 20 mmol/L (ref 0–100)
TCO2: 23 mmol/L (ref 0–100)
pCO2 arterial: 40.9 mmHg (ref 35.0–45.0)
pCO2 arterial: 40.6 mmHg (ref 35.0–45.0)
pCO2 arterial: 41.3 mmHg (ref 35.0–45.0)
pH, Arterial: 7.332 — ABNORMAL LOW (ref 7.350–7.450)
pO2, Arterial: 271 mmHg — ABNORMAL HIGH (ref 80.0–100.0)
pO2, Arterial: 84 mmHg (ref 80.0–100.0)
pO2, Arterial: 90 mmHg (ref 80.0–100.0)

## 2014-10-08 LAB — TROPONIN I
Troponin I: 2.68 ng/mL (ref <0.031)
Troponin I: 3.53 ng/mL (ref <0.031)
Troponin I: 2.79 ng/mL (ref <0.031)

## 2014-10-08 LAB — LIPID PANEL
Cholesterol: 143 mg/dL (ref 0–200)
HDL: 36 mg/dL — ABNORMAL LOW (ref >39)
LDL CALC: 89 mg/dL (ref 0–99)
Total CHOL/HDL Ratio: 4 RATIO
Triglycerides: 88 mg/dL (ref <150)
VLDL: 18 mg/dL (ref 0–40)

## 2014-10-08 LAB — HEPARIN LEVEL (UNFRACTIONATED): Heparin Unfractionated: 0.15 IU/mL — ABNORMAL LOW (ref 0.30–0.70)

## 2014-10-08 LAB — POCT ACTIVATED CLOTTING TIME
Activated Clotting Time: 202 seconds
Activated Clotting Time: 263 seconds

## 2014-10-08 LAB — PROTIME-INR
INR: 1.15 (ref 0.00–1.49)
PROTHROMBIN TIME: 14.9 s (ref 11.6–15.2)

## 2014-10-08 LAB — HEMOGLOBIN AND HEMATOCRIT, BLOOD
HCT: 24.7 % — ABNORMAL LOW (ref 39.0–52.0)
Hemoglobin: 8.6 g/dL — ABNORMAL LOW (ref 13.0–17.0)

## 2014-10-08 LAB — POCT I-STAT GLUCOSE
GLUCOSE: 310 mg/dL — AB (ref 70–99)
Glucose, Bld: 319 mg/dL — ABNORMAL HIGH (ref 70–99)
OPERATOR ID: 3406
Operator id: 3406

## 2014-10-08 LAB — PLATELET COUNT: Platelets: 118 10*3/uL — ABNORMAL LOW (ref 150–400)

## 2014-10-08 LAB — PREPARE RBC (CROSSMATCH)

## 2014-10-08 LAB — BRAIN NATRIURETIC PEPTIDE: B Natriuretic Peptide: 426.5 pg/mL — ABNORMAL HIGH (ref 0.0–100.0)

## 2014-10-08 LAB — TSH: TSH: 3.043 u[IU]/mL (ref 0.350–4.500)

## 2014-10-08 SURGERY — LEFT HEART CATHETERIZATION WITH CORONARY ANGIOGRAM
Anesthesia: LOCAL

## 2014-10-08 SURGERY — CORONARY ARTERY BYPASS GRAFTING (CABG)
Anesthesia: General | Site: Chest

## 2014-10-08 MED ORDER — PHENYLEPHRINE HCL 10 MG/ML IJ SOLN
10.0000 mg | INTRAVENOUS | Status: DC | PRN
  Administered 2014-10-08: 50 ug/min via INTRAVENOUS

## 2014-10-08 MED ORDER — FENTANYL CITRATE 0.05 MG/ML IJ SOLN
INTRAMUSCULAR | Status: DC | PRN
  Administered 2014-10-08 (×2): 150 ug via INTRAVENOUS
  Administered 2014-10-08: 50 ug via INTRAVENOUS
  Administered 2014-10-08: 100 ug via INTRAVENOUS
  Administered 2014-10-08: 500 ug via INTRAVENOUS
  Administered 2014-10-08: 100 ug via INTRAVENOUS
  Administered 2014-10-08: 300 ug via INTRAVENOUS
  Administered 2014-10-09: 150 ug via INTRAVENOUS

## 2014-10-08 MED ORDER — LIDOCAINE HCL (CARDIAC) 20 MG/ML IV SOLN
INTRAVENOUS | Status: AC
  Filled 2014-10-08: qty 5

## 2014-10-08 MED ORDER — SODIUM CHLORIDE 0.9 % IV SOLN
250.0000 mL | INTRAVENOUS | Status: DC | PRN
Start: 2014-10-08 — End: 2014-10-08
  Administered 2014-10-08 (×3): via INTRAVENOUS

## 2014-10-08 MED ORDER — AMINOCAPROIC ACID 250 MG/ML IV SOLN
INTRAVENOUS | Status: DC
  Filled 2014-10-08: qty 40

## 2014-10-08 MED ORDER — DEXTROSE 5 % IV SOLN
1.5000 g | INTRAVENOUS | Status: AC
  Administered 2014-10-08: .75 g via INTRAVENOUS
  Administered 2014-10-08: 1.5 g via INTRAVENOUS
  Filled 2014-10-08: qty 1.5

## 2014-10-08 MED ORDER — SODIUM BICARBONATE 8.4 % IV SOLN
INTRAVENOUS | Status: DC | PRN
  Administered 2014-10-08: 50 meq via INTRAVENOUS

## 2014-10-08 MED ORDER — 0.9 % SODIUM CHLORIDE (POUR BTL) OPTIME
TOPICAL | Status: DC | PRN
  Administered 2014-10-08: 1000 mL

## 2014-10-08 MED ORDER — NOREPINEPHRINE BITARTRATE 1 MG/ML IV SOLN
INTRAVENOUS | Status: AC
  Filled 2014-10-08: qty 4

## 2014-10-08 MED ORDER — PLASMA-LYTE 148 IV SOLN
INTRAVENOUS | Status: DC | PRN
  Administered 2014-10-08: 500 mL via INTRAVASCULAR

## 2014-10-08 MED ORDER — SODIUM CHLORIDE 0.9 % IJ SOLN
INTRAMUSCULAR | Status: DC | PRN
  Administered 2014-10-08 (×4): 1 mL via TOPICAL

## 2014-10-08 MED ORDER — PHENYLEPHRINE 40 MCG/ML (10ML) SYRINGE FOR IV PUSH (FOR BLOOD PRESSURE SUPPORT)
PREFILLED_SYRINGE | INTRAVENOUS | Status: AC
  Filled 2014-10-08: qty 10

## 2014-10-08 MED ORDER — FENTANYL CITRATE 0.05 MG/ML IJ SOLN
INTRAMUSCULAR | Status: AC
  Filled 2014-10-08: qty 5

## 2014-10-08 MED ORDER — MIDAZOLAM HCL 5 MG/5ML IJ SOLN
INTRAMUSCULAR | Status: DC | PRN
  Administered 2014-10-08 (×2): 3 mg via INTRAVENOUS
  Administered 2014-10-08: 4 mg via INTRAVENOUS

## 2014-10-08 MED ORDER — ALBUMIN HUMAN 5 % IV SOLN
INTRAVENOUS | Status: DC | PRN
  Administered 2014-10-08 (×3): via INTRAVENOUS

## 2014-10-08 MED ORDER — ETOMIDATE 2 MG/ML IV SOLN
INTRAVENOUS | Status: DC | PRN
  Administered 2014-10-08: 6 mg via INTRAVENOUS

## 2014-10-08 MED ORDER — DEXTROSE 5 % IV SOLN
0.0000 ug/min | INTRAVENOUS | Status: DC
  Filled 2014-10-08: qty 4

## 2014-10-08 MED ORDER — POTASSIUM CHLORIDE 2 MEQ/ML IV SOLN
80.0000 meq | INTRAVENOUS | Status: DC
  Filled 2014-10-08: qty 40

## 2014-10-08 MED ORDER — HEMOSTATIC AGENTS (NO CHARGE) OPTIME
TOPICAL | Status: DC | PRN
  Administered 2014-10-08: 1 via TOPICAL

## 2014-10-08 MED ORDER — HEPARIN SODIUM (PORCINE) 1000 UNIT/ML IJ SOLN
INTRAMUSCULAR | Status: AC
  Filled 2014-10-08: qty 1

## 2014-10-08 MED ORDER — LACTATED RINGERS IV SOLN
INTRAVENOUS | Status: DC | PRN
  Administered 2014-10-08 (×2): via INTRAVENOUS

## 2014-10-08 MED ORDER — MILRINONE IN DEXTROSE 20 MG/100ML IV SOLN
INTRAVENOUS | Status: DC | PRN
  Administered 2014-10-08: .2 ug/kg/min via INTRAVENOUS

## 2014-10-08 MED ORDER — SODIUM CHLORIDE 0.9 % IV SOLN
20.0000 ug | INTRAVENOUS | Status: DC | PRN
  Administered 2014-10-08: 22.2 ug via INTRAVENOUS

## 2014-10-08 MED ORDER — PROTAMINE SULFATE 10 MG/ML IV SOLN
INTRAVENOUS | Status: DC | PRN
  Administered 2014-10-08: 50 mg via INTRAVENOUS
  Administered 2014-10-08: 40 mg via INTRAVENOUS
  Administered 2014-10-08: 20 mg via INTRAVENOUS
  Administered 2014-10-08: 10 mg via INTRAVENOUS
  Administered 2014-10-08: 30 mg via INTRAVENOUS
  Administered 2014-10-08: 20 mg via INTRAVENOUS
  Administered 2014-10-08: 30 mg via INTRAVENOUS

## 2014-10-08 MED ORDER — SODIUM CHLORIDE 0.9 % IJ SOLN: 3.0000 mL | INTRAMUSCULAR | Status: DC | PRN

## 2014-10-08 MED ORDER — MIDAZOLAM HCL 2 MG/2ML IJ SOLN
INTRAMUSCULAR | Status: AC
  Filled 2014-10-08: qty 2

## 2014-10-08 MED ORDER — DEXTROSE 5 % IV SOLN
0.0000 ug/min | INTRAVENOUS | Status: DC
  Administered 2014-10-09: 35 ug/min via INTRAVENOUS
  Filled 2014-10-08 (×2): qty 4

## 2014-10-08 MED ORDER — SUCCINYLCHOLINE CHLORIDE 20 MG/ML IJ SOLN
INTRAMUSCULAR | Status: DC | PRN
  Administered 2014-10-08: 120 mg via INTRAVENOUS

## 2014-10-08 MED ORDER — CALCIUM CHLORIDE 10 % IV SOLN
INTRAVENOUS | Status: DC | PRN
Start: 2014-10-08 — End: 2014-10-09
  Administered 2014-10-08: 200 mg via INTRAVENOUS
  Administered 2014-10-08: 100 mg via INTRAVENOUS
  Administered 2014-10-08: 200 mg via INTRAVENOUS
  Administered 2014-10-08: 100 mg via INTRAVENOUS
  Administered 2014-10-08: 300 mg via INTRAVENOUS
  Administered 2014-10-08: 700 mg via INTRAVENOUS
  Administered 2014-10-08 (×2): 200 mg via INTRAVENOUS

## 2014-10-08 MED ORDER — DOPAMINE-DEXTROSE 3.2-5 MG/ML-% IV SOLN
INTRAVENOUS | Status: DC | PRN
  Administered 2014-10-08: 3 ug/kg/min via INTRAVENOUS

## 2014-10-08 MED ORDER — NITROGLYCERIN IN D5W 200-5 MCG/ML-% IV SOLN
2.0000 ug/min | INTRAVENOUS | Status: DC
  Filled 2014-10-08: qty 250

## 2014-10-08 MED ORDER — PNEUMOCOCCAL VAC POLYVALENT 25 MCG/0.5ML IJ INJ
0.5000 mL | INJECTION | INTRAMUSCULAR | Status: DC
  Filled 2014-10-08: qty 0.5

## 2014-10-08 MED ORDER — PHENYLEPHRINE HCL 10 MG/ML IJ SOLN
INTRAMUSCULAR | Status: DC | PRN
  Administered 2014-10-08: 40 ug via INTRAVENOUS
  Administered 2014-10-08: 80 ug via INTRAVENOUS

## 2014-10-08 MED ORDER — FUROSEMIDE 10 MG/ML IJ SOLN
INTRAMUSCULAR | Status: AC
  Filled 2014-10-08: qty 4

## 2014-10-08 MED ORDER — SUCCINYLCHOLINE CHLORIDE 20 MG/ML IJ SOLN
INTRAMUSCULAR | Status: AC
  Filled 2014-10-08: qty 1

## 2014-10-08 MED ORDER — NOREPINEPHRINE BITARTRATE 1 MG/ML IV SOLN
4000.0000 ug | INTRAVENOUS | Status: DC | PRN
  Administered 2014-10-08: 5 ug/min via INTRAVENOUS

## 2014-10-08 MED ORDER — ASPIRIN 81 MG PO CHEW: 81.0000 mg | CHEWABLE_TABLET | ORAL | Status: DC

## 2014-10-08 MED ORDER — ROCURONIUM BROMIDE 50 MG/5ML IV SOLN
INTRAVENOUS | Status: AC
  Filled 2014-10-08: qty 1

## 2014-10-08 MED ORDER — NITROGLYCERIN 0.2 MG/ML ON CALL CATH LAB
INTRAVENOUS | Status: AC
  Filled 2014-10-08: qty 1

## 2014-10-08 MED ORDER — SODIUM CHLORIDE 0.9 % IV SOLN
INTRAVENOUS | Status: DC
  Filled 2014-10-08: qty 30

## 2014-10-08 MED ORDER — FENTANYL CITRATE 0.05 MG/ML IJ SOLN
INTRAMUSCULAR | Status: AC
Start: 2014-10-08 — End: 2014-10-08
  Filled 2014-10-08: qty 5

## 2014-10-08 MED ORDER — DEXMEDETOMIDINE HCL IN NACL 400 MCG/100ML IV SOLN
0.1000 ug/kg/h | INTRAVENOUS | Status: AC
  Administered 2014-10-08: .3 ug/kg/h via INTRAVENOUS
  Filled 2014-10-08: qty 100

## 2014-10-08 MED ORDER — MAGNESIUM SULFATE 50 % IJ SOLN
40.0000 meq | INTRAMUSCULAR | Status: DC
  Filled 2014-10-08: qty 10

## 2014-10-08 MED ORDER — MILRINONE IN DEXTROSE 20 MG/100ML IV SOLN
0.1250 ug/kg/min | INTRAVENOUS | Status: DC
  Filled 2014-10-08: qty 100

## 2014-10-08 MED ORDER — PLASMA-LYTE 148 IV SOLN
INTRAVENOUS | Status: DC
  Filled 2014-10-08: qty 2.5

## 2014-10-08 MED ORDER — MIDAZOLAM HCL 10 MG/2ML IJ SOLN
INTRAMUSCULAR | Status: AC
  Filled 2014-10-08: qty 2

## 2014-10-08 MED ORDER — ROCURONIUM BROMIDE 100 MG/10ML IV SOLN
INTRAVENOUS | Status: DC | PRN
  Administered 2014-10-08: 20 mg via INTRAVENOUS
  Administered 2014-10-08: 50 mg via INTRAVENOUS
  Administered 2014-10-08: 30 mg via INTRAVENOUS
  Administered 2014-10-08 (×2): 50 mg via INTRAVENOUS

## 2014-10-08 MED ORDER — ROCURONIUM BROMIDE 50 MG/5ML IV SOLN
INTRAVENOUS | Status: AC
  Filled 2014-10-08: qty 2

## 2014-10-08 MED ORDER — VANCOMYCIN HCL 10 G IV SOLR
1250.0000 mg | INTRAVENOUS | Status: AC
  Administered 2014-10-08: 1250 mg via INTRAVENOUS
  Filled 2014-10-08: qty 1250

## 2014-10-08 MED ORDER — SODIUM CHLORIDE 0.9 % IJ SOLN: 3.0000 mL | Freq: Two times a day (BID) | INTRAMUSCULAR | Status: DC

## 2014-10-08 MED ORDER — FENTANYL CITRATE 0.05 MG/ML IJ SOLN
INTRAMUSCULAR | Status: AC
  Filled 2014-10-08: qty 2

## 2014-10-08 MED ORDER — COAGULATION FACTOR VIIA RECOMB 1 MG IV SOLR
45.0000 ug/kg | Freq: Once | INTRAVENOUS | Status: AC
  Administered 2014-10-08: 3 mg via INTRAVENOUS
  Filled 2014-10-08: qty 3

## 2014-10-08 MED ORDER — AMIODARONE HCL IN DEXTROSE 360-4.14 MG/200ML-% IV SOLN
INTRAVENOUS | Status: DC | PRN
  Administered 2014-10-08: 643 mg/h via INTRAVENOUS

## 2014-10-08 MED ORDER — DOPAMINE-DEXTROSE 3.2-5 MG/ML-% IV SOLN
0.0000 ug/kg/min | INTRAVENOUS | Status: DC
  Filled 2014-10-08: qty 250

## 2014-10-08 MED ORDER — HEPARIN SODIUM (PORCINE) 1000 UNIT/ML IJ SOLN
INTRAMUSCULAR | Status: DC | PRN
  Administered 2014-10-08: 20000 [IU] via INTRAVENOUS

## 2014-10-08 MED ORDER — DEXTROSE 5 % IV SOLN
30.0000 ug/min | INTRAVENOUS | Status: DC
  Filled 2014-10-08: qty 2

## 2014-10-08 MED ORDER — HEPARIN BOLUS VIA INFUSION
2000.0000 [IU] | Freq: Once | INTRAVENOUS | Status: DC
  Filled 2014-10-08: qty 2000

## 2014-10-08 MED ORDER — SODIUM CHLORIDE 0.9 % IV SOLN
INTRAVENOUS | Status: AC
  Administered 2014-10-08: 1 [IU]/h via INTRAVENOUS
  Filled 2014-10-08: qty 2.5

## 2014-10-08 MED ORDER — SODIUM CHLORIDE 0.9 % IV SOLN
20.0000 ug | Freq: Once | INTRAVENOUS | Status: DC
  Filled 2014-10-08: qty 5

## 2014-10-08 MED ORDER — DEXTROSE 5 % IV SOLN
750.0000 mg | INTRAVENOUS | Status: DC
  Filled 2014-10-08 (×2): qty 750

## 2014-10-08 MED ORDER — SODIUM CHLORIDE 0.9 % IV SOLN
INTRAVENOUS | Status: AC
  Administered 2014-10-08: 69.8 mL/h via INTRAVENOUS
  Administered 2014-10-08: 23:00:00 via INTRAVENOUS
  Filled 2014-10-08: qty 40

## 2014-10-08 MED ORDER — LACTATED RINGERS IV SOLN
INTRAVENOUS | Status: DC | PRN
  Administered 2014-10-08 (×2): via INTRAVENOUS

## 2014-10-08 MED ORDER — VERAPAMIL HCL 2.5 MG/ML IV SOLN
INTRAVENOUS | Status: AC
  Filled 2014-10-08: qty 2

## 2014-10-08 SURGICAL SUPPLY — 106 items
ADAPTER CARDIO PERF ANTE/RETRO (ADAPTER) ×4 IMPLANT
BAG DECANTER FOR FLEXI CONT (MISCELLANEOUS) ×4 IMPLANT
BANDAGE ELASTIC 4 VELCRO ST LF (GAUZE/BANDAGES/DRESSINGS) ×4 IMPLANT
BANDAGE ELASTIC 6 VELCRO ST LF (GAUZE/BANDAGES/DRESSINGS) ×4 IMPLANT
BASKET HEART  (ORDER IN 25'S) (MISCELLANEOUS) ×1
BASKET HEART (ORDER IN 25'S) (MISCELLANEOUS) ×1
BASKET HEART (ORDER IN 25S) (MISCELLANEOUS) ×2 IMPLANT
BLADE STERNUM SYSTEM 6 (BLADE) ×8 IMPLANT
BLADE SURG 11 STRL SS (BLADE) ×8 IMPLANT
BLADE SURG 12 STRL SS (BLADE) ×4 IMPLANT
BLADE SURG ROTATE 9660 (MISCELLANEOUS) ×4 IMPLANT
BNDG GAUZE ELAST 4 BULKY (GAUZE/BANDAGES/DRESSINGS) ×4 IMPLANT
CANISTER SUCTION 2500CC (MISCELLANEOUS) ×4 IMPLANT
CANNULA GUNDRY RCSP 15FR (MISCELLANEOUS) ×4 IMPLANT
CATH CPB KIT VANTRIGT (MISCELLANEOUS) ×4 IMPLANT
CATH HEART VENT LEFT (CATHETERS) ×2 IMPLANT
CATH ROBINSON RED A/P 18FR (CATHETERS) ×12 IMPLANT
CATH THORACIC 36FR RT ANG (CATHETERS) ×4 IMPLANT
COVER SURGICAL LIGHT HANDLE (MISCELLANEOUS) ×4 IMPLANT
CRADLE DONUT ADULT HEAD (MISCELLANEOUS) ×4 IMPLANT
DERMABOND ADHESIVE PROPEN (GAUZE/BANDAGES/DRESSINGS) ×2
DERMABOND ADVANCED .7 DNX6 (GAUZE/BANDAGES/DRESSINGS) ×2 IMPLANT
DRAIN CHANNEL 32F RND 10.7 FF (WOUND CARE) ×4 IMPLANT
DRAPE CARDIOVASCULAR INCISE (DRAPES) ×2
DRAPE SLUSH/WARMER DISC (DRAPES) ×4 IMPLANT
DRAPE SRG 135X102X78XABS (DRAPES) ×2 IMPLANT
DRSG AQUACEL AG ADV 3.5X14 (GAUZE/BANDAGES/DRESSINGS) ×4 IMPLANT
ELECT BLADE 4.0 EZ CLEAN MEGAD (MISCELLANEOUS) ×4
ELECT BLADE 6.5 EXT (BLADE) ×4 IMPLANT
ELECT CAUTERY BLADE 6.4 (BLADE) ×4 IMPLANT
ELECT REM PT RETURN 9FT ADLT (ELECTROSURGICAL) ×8
ELECTRODE BLDE 4.0 EZ CLN MEGD (MISCELLANEOUS) ×2 IMPLANT
ELECTRODE REM PT RTRN 9FT ADLT (ELECTROSURGICAL) ×4 IMPLANT
GAUZE SPONGE 4X4 12PLY STRL (GAUZE/BANDAGES/DRESSINGS) ×8 IMPLANT
GLOVE BIO SURGEON STRL SZ7.5 (GLOVE) ×12 IMPLANT
GLOVE BIOGEL M 6.5 STRL (GLOVE) ×8 IMPLANT
GLOVE BIOGEL M STER SZ 6 (GLOVE) ×8 IMPLANT
GLOVE BIOGEL PI IND STRL 6.5 (GLOVE) ×6 IMPLANT
GLOVE BIOGEL PI IND STRL 7.0 (GLOVE) ×6 IMPLANT
GLOVE BIOGEL PI INDICATOR 6.5 (GLOVE) ×6
GLOVE BIOGEL PI INDICATOR 7.0 (GLOVE) ×6
GOWN STRL REUS W/ TWL LRG LVL3 (GOWN DISPOSABLE) ×14 IMPLANT
GOWN STRL REUS W/TWL LRG LVL3 (GOWN DISPOSABLE) ×14
HEMOSTAT POWDER SURGIFOAM 1G (HEMOSTASIS) ×12 IMPLANT
HEMOSTAT SURGICEL 2X14 (HEMOSTASIS) ×4 IMPLANT
INSERT FOGARTY XLG (MISCELLANEOUS) IMPLANT
KIT BASIN OR (CUSTOM PROCEDURE TRAY) ×4 IMPLANT
KIT ROOM TURNOVER OR (KITS) ×4 IMPLANT
KIT SUCTION CATH 14FR (SUCTIONS) ×4 IMPLANT
KIT VASOVIEW W/TROCAR VH 2000 (KITS) ×4 IMPLANT
LEAD PACING MYOCARDI (MISCELLANEOUS) ×4 IMPLANT
LINE VENT (MISCELLANEOUS) ×4 IMPLANT
MARKER GRAFT CORONARY BYPASS (MISCELLANEOUS) ×12 IMPLANT
NS IRRIG 1000ML POUR BTL (IV SOLUTION) ×20 IMPLANT
PACK OPEN HEART (CUSTOM PROCEDURE TRAY) ×4 IMPLANT
PAD ARMBOARD 7.5X6 YLW CONV (MISCELLANEOUS) ×8 IMPLANT
PAD ELECT DEFIB RADIOL ZOLL (MISCELLANEOUS) ×4 IMPLANT
PENCIL BUTTON HOLSTER BLD 10FT (ELECTRODE) ×4 IMPLANT
PUNCH AORTIC ROT 4.0MM RCL 40 (MISCELLANEOUS) ×4 IMPLANT
PUNCH AORTIC ROTATE 4.0MM (MISCELLANEOUS) IMPLANT
PUNCH AORTIC ROTATE 4.5MM 8IN (MISCELLANEOUS) IMPLANT
PUNCH AORTIC ROTATE 5MM 8IN (MISCELLANEOUS) IMPLANT
SET CARDIOPLEGIA MPS 5001102 (MISCELLANEOUS) ×4 IMPLANT
SOLUTION ANTI FOG 6CC (MISCELLANEOUS) ×4 IMPLANT
SPONGE GAUZE 4X4 12PLY STER LF (GAUZE/BANDAGES/DRESSINGS) ×8 IMPLANT
SPONGE LAP 18X18 X RAY DECT (DISPOSABLE) ×16 IMPLANT
SPONGE LAP 4X18 X RAY DECT (DISPOSABLE) ×4 IMPLANT
SURGIFLO W/THROMBIN 8M KIT (HEMOSTASIS) ×4 IMPLANT
SUT BONE WAX W31G (SUTURE) ×4 IMPLANT
SUT MNCRL AB 4-0 PS2 18 (SUTURE) ×4 IMPLANT
SUT PROLENE 3 0 RB 1 (SUTURE) ×4 IMPLANT
SUT PROLENE 3 0 SH DA (SUTURE) IMPLANT
SUT PROLENE 3 0 SH1 36 (SUTURE) IMPLANT
SUT PROLENE 4 0 RB 1 (SUTURE) ×2
SUT PROLENE 4 0 SH DA (SUTURE) ×4 IMPLANT
SUT PROLENE 4-0 RB1 .5 CRCL 36 (SUTURE) ×2 IMPLANT
SUT PROLENE 5 0 C 1 36 (SUTURE) ×4 IMPLANT
SUT PROLENE 6 0 C 1 24 (SUTURE) ×8 IMPLANT
SUT PROLENE 6 0 C 1 30 (SUTURE) IMPLANT
SUT PROLENE 6 0 CC (SUTURE) ×16 IMPLANT
SUT PROLENE 8 0 BV175 6 (SUTURE) IMPLANT
SUT PROLENE BLUE 7 0 (SUTURE) ×8 IMPLANT
SUT PROLENE POLY MONO (SUTURE) ×4 IMPLANT
SUT SILK  1 MH (SUTURE) ×6
SUT SILK 1 MH (SUTURE) ×6 IMPLANT
SUT SILK 2 0 SH CR/8 (SUTURE) ×4 IMPLANT
SUT SILK 3 0 SH CR/8 (SUTURE) IMPLANT
SUT STEEL 6MS V (SUTURE) ×4 IMPLANT
SUT STEEL SZ 6 DBL 3X14 BALL (SUTURE) ×4 IMPLANT
SUT VIC AB 1 CTX 36 (SUTURE) ×8
SUT VIC AB 1 CTX36XBRD ANBCTR (SUTURE) ×8 IMPLANT
SUT VIC AB 2-0 CT1 27 (SUTURE) ×2
SUT VIC AB 2-0 CT1 TAPERPNT 27 (SUTURE) ×2 IMPLANT
SUT VIC AB 2-0 CTX 27 (SUTURE) IMPLANT
SUT VIC AB 3-0 X1 27 (SUTURE) IMPLANT
SUTURE E-PAK OPEN HEART (SUTURE) ×4 IMPLANT
SYSTEM SAHARA CHEST DRAIN ATS (WOUND CARE) ×4 IMPLANT
TAPE CLOTH SURG 4X10 WHT LF (GAUZE/BANDAGES/DRESSINGS) ×8 IMPLANT
TOWEL OR 17X24 6PK STRL BLUE (TOWEL DISPOSABLE) ×8 IMPLANT
TOWEL OR 17X26 10 PK STRL BLUE (TOWEL DISPOSABLE) ×8 IMPLANT
TRAY FOLEY IC TEMP SENS 16FR (CATHETERS) ×4 IMPLANT
TUBING INSUFFLATION (TUBING) ×4 IMPLANT
UNDERPAD 30X30 INCONTINENT (UNDERPADS AND DIAPERS) ×4 IMPLANT
VENT LEFT HEART 12002 (CATHETERS) ×4
WATER STERILE IRR 1000ML POUR (IV SOLUTION) ×8 IMPLANT
YANKAUER SUCT BULB TIP NO VENT (SUCTIONS) ×4 IMPLANT

## 2014-10-08 NOTE — Consult Note (Signed)
[address]       [place] [address]             [phone]        JAMESPAUL SECRIST [facility] Medical Record #[id number] Date of Birth: 09-10-1945  Referring: Isabel Caprice Primary Care: Junious Silk, MD  Chief Complaint:    Chief Complaint  Patient presents with  . Chest Pain  . Dizziness   History of Present Illness:      Mr. Canavan is a 69 yo male with known history of Hypertension, CKD and Dyslipidemia who presented to [facility] with complaints of increased exercise induced chest discomfort.  The patient stated he has actually been having symptoms since February.  He experiences chest pressure with radiation down to his left arm usually when working in his yard.  EKG in the ED was unremarkable but his Troponin was elevated and he was ruled in for a NSTEMI.  He was treated with ASA and placed on a Heparin gtt.  The patient was transferred to [facility] for further care.  Upon arrival to [facility] the patient was chest pain free and has remained chest pain free prior to cardiac catheterization.  He was admitted by Cardiology and taken for cardiac catheterization today.  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  There was also significant left main involvement.  The patient developed chest pain during the procedure and due to the severity of his CAD an Impella device was placed.  Currently, patient is chest pain free.  He has some shortness of breath post procedure.    Current Activity/ Functional Status: Patient was independent with mobility/ambulation, transfers, ADL's, IADL's.   Zubrod Score: At the time of surgery this patient's most appropriate activity status/level should be described as:     0    Normal activity, no symptoms     1    Restricted in physical strenuous activity but ambulatory, able to do out light work     2    Ambulatory and capable of  self care, unable to do work activities, up and about                 more than 50%  Of the time                                3    Only limited self care, in bed greater than 50% of waking hours     4    Completely disabled, no self care, confined to bed or chair     5    Moribund  Past Medical History  Diagnosis Date  . Hypertension   . Renal disorder   . Myocardial infarction 09/2014    nstemi    Past Surgical History  Procedure Laterality Date  . Tonsillectomy    . Colonoscopy  2015    History  Smoking status  . Never Smoker   Smokeless tobacco  . Never Used    History  Alcohol Use No    History   Social History  . Marital Status: Married    Spouse Name: N/A  . Number of Children: N/A  . Years of Education: N/A   Occupational History  . Not on file.   Social History Main Topics  . Smoking status: Never  Smoker   . Smokeless tobacco: Never Used  . Alcohol Use: No  . Drug Use: No  . Sexual Activity: Not on file   Other Topics Concern  . Not on file   Social History Narrative    No Known Allergies  Current Facility-Administered Medications  Medication Dose Route Frequency Provider Last Rate Last Dose  . 0.9 %  sodium chloride infusion  250 mL Intravenous PRN Azalee Course, PA      . acetaminophen (TYLENOL) tablet 650 mg  650 mg Oral Q4H PRN Leeann Must, MD      . aminocaproic acid (AMICAR) 10 g in sodium chloride 0.9 % 100 mL infusion   Intravenous To OR Earnie Larsson, RPH      . aspirin chewable tablet 81 mg  81 mg Oral [address], [place]      . aspirin EC tablet 81 mg  81 mg Oral Daily Leeann Must, MD      . atorvastatin (LIPITOR) tablet 80 mg  80 mg Oral q1800 Leeann Must, MD   80 mg at 10/07/14 2357  . cefUROXime (ZINACEF) 1.5 g in dextrose 5 % 50 mL IVPB  1.5 g Intravenous To OR Earnie Larsson, RPH      . cefUROXime (ZINACEF) 750 mg in dextrose 5 % 50 mL IVPB  750 mg Intravenous To OR Earnie Larsson, RPH      . dexmedetomidine (PRECEDEX) 400  MCG/100ML (4 mcg/mL) infusion  0.1-0.7 mcg/kg/hr Intravenous To OR Earnie Larsson, RPH      . DOPamine (INTROPIN) 800 mg in dextrose 5 % 250 mL (3.2 mg/mL) infusion  0-10 mcg/kg/min Intravenous To OR Earnie Larsson, RPH      . EPINEPHrine (ADRENALIN) 4 mg in dextrose 5 % 250 mL (0.016 mg/mL) infusion  0-10 mcg/min Intravenous To OR Earnie Larsson, RPH      . heparin 2,500 Units, papaverine 30 mg in electrolyte-148 (PLASMALYTE-148) 500 mL irrigation   Irrigation To OR Earnie Larsson, RPH      . heparin 30,000 units/NS 1000 mL solution for CELLSAVER   Other To OR Earnie Larsson, RPH      . heparin ADULT infusion 100 units/mL (25000 units/250 mL)  1,050 Units/hr Intravenous Continuous Candis Schatz Mancheril, RPH 10.5 mL/hr at 10/08/14 0945 1,050 Units/hr at 10/08/14 0945  . heparin bolus via infusion 2,000 Units  2,000 Units Intravenous Once Sampson Si, RPH      . insulin regular (NOVOLIN R,HUMULIN R) 250 Units in sodium chloride 0.9 % 250 mL (1 Units/mL) infusion   Intravenous To OR Earnie Larsson, RPH      . magnesium sulfate (IV Push/IM) injection 40 mEq  40 mEq Other To OR Earnie Larsson, RPH      . metoprolol tartrate (LOPRESSOR) tablet 12.5 mg  12.5 mg Oral BID Leeann Must, MD      . nitroGLYCERIN (NITROSTAT) SL tablet 0.4 mg  0.4 mg Sublingual Q5 Min x 3 PRN Leeann Must, MD      . nitroGLYCERIN 50 mg in dextrose 5 % 250 mL (0.2 mg/mL) infusion  2-200 mcg/min Intravenous To OR Earnie Larsson, RPH      . ondansetron [facility]) injection 4 mg  4 mg Intravenous Q6H PRN Leeann Must, MD   4 mg at 10/08/14 0801  . phenylephrine (NEO-SYNEPHRINE) 20 mg in dextrose 5 % 250 mL (0.08 mg/mL) infusion  30-200 mcg/min Intravenous To OR Earnie Larsson, RPH      . [  START ON 10/09/2014] pneumococcal 23 valent vaccine (PNU-IMMUNE) injection 0.5 mL  0.5 mL Intramuscular Tomorrow-1000 Leeann Must, MD      . potassium chloride injection 80 mEq  80 mEq Other To OR Earnie Larsson, RPH      . sodium chloride 0.9 %  injection 3 mL  3 mL Intravenous Q12H Azalee Course, PA      . sodium chloride 0.9 % injection 3 mL  3 mL Intravenous PRN Azalee Course, PA      . vancomycin (VANCOCIN) 1,250 mg in sodium chloride 0.9 % 250 mL IVPB  1,250 mg Intravenous To OR Earnie Larsson, [facility]        Prescriptions prior to admission  Medication Sig Dispense Refill Last Dose  . olmesartan (BENICAR) 40 MG tablet Take 20 mg by mouth daily.   10/06/2014 at Unknown time  . pravastatin (PRAVACHOL) 80 MG tablet Take 80 mg by mouth daily.   10/06/2014 at Unknown time  . Vitamin D, Ergocalciferol, (DRISDOL) 50000 UNITS CAPS capsule Take 50,000 Units by mouth every 14 (fourteen) days.   10/03/2014    History reviewed. No pertinent family history.   Review of Systems:  Pertinent items are noted in HPI.     Cardiac Review of Systems: Y or N  Chest Pain [  y  ]  Resting SOB [   ] Exertional SOB  Cove.Etienne  ]  Orthopnea [  ]   Pedal Edema [   ]    Palpitations [  ] Syncope  [  ]   Presyncope [   ]  General Review of Systems: [Y] = yes [  ]=no Constitional: recent weight change [  ]; anorexia [  ]; fatigue [  ]; nausea [ n ]; night sweats [  ]; fever [  ]; or chills [  ]                                                               Dental: poor dentition[  ]; Last Dentist visit:   Eye : blurred vision [  ]; diplopia [   ]; vision changes [  ];  Amaurosis fugax[  ]; Resp: cough [n  ];  wheezing[  ];  hemoptysis[  ]; shortness of breath[  ]; paroxysmal nocturnal dyspnea[  ]; dyspnea on exertion[ y ]; or orthopnea[  ];  GI:  gallstones[  ], vomiting[  ];  dysphagia[  ]; melena[  ];  hematochezia [  ]; heartburn[  ];   Hx of  Colonoscopy[  ]; GU: kidney stones [  ]; hematuria[  ];   dysuria [  ];  nocturia[  ];  history of     obstruction [  ]; urinary frequency [  ]             Skin: rash, swelling[n  ];, hair loss[  ];  peripheral edema[  ];  or itching[  ]; Musculosketetal: myalgias[  ];  joint swelling[  ];  joint erythema[  ];  joint pain[  ];  back  pain[  ];  Heme/Lymph: bruising[  ];  bleeding[  ];  anemia[  ];  Neuro: TIA[  ];  headaches[  ];  stroke[  ];  vertigo[  ];  seizures[  ];  paresthesias[  ];  difficulty walking[  ];  Psych:depression[  ]; anxiety[  ];  Endocrine: diabetes[n  ];  thyroid dysfunction[  ];  Immunizations: Flu [  ]; Pneumococcal[  ];  Other:  Physical Exam: BP 100/69 mmHg  Pulse 64  Temp(Src) 98.4 F (36.9 C) (Oral)  Resp 17  Ht 5\' 6"  (1.676 m)  Wt 163 lb 1.6 oz (73.982 kg)  BMI 26.34 kg/m2  SpO2 95%  General appearance: alert, cooperative and mild distress Head: Normocephalic, without obvious abnormality, atraumatic Neck: no adenopathy, no carotid bruit, no JVD, supple, symmetrical, trachea midline and thyroid not enlarged, symmetric, no tenderness/mass/nodules Resp: coarse bilaterall Cardio: regular rate and rhythm GI: soft, non-tender; bowel sounds normal; no masses,  no organomegaly Extremities: extremities normal, atraumatic, no cyanosis or edema Neurologic: Grossly normal  Diagnostic Studies & Laboratory data:     Recent Radiology Findings:   Dg Chest 2 View  10/07/2014   CLINICAL DATA:  Four day history of dizziness and chest pain  EXAM: CHEST  2 VIEW  COMPARISON:  September 19, 2010  FINDINGS: There is minimal scarring left base. There is no edema or consolidation. Heart size and pulmonary vascularity are normal. No adenopathy. No pneumothorax. No bone lesions.  IMPRESSION: Minimal scarring left base.  No edema or consolidation.   Electronically Signed   By: Bretta Bang III M.D.   On: 10/07/2014 18:44     I have independently reviewed the above radiologic studies.  Recent Lab Findings: Lab Results  Component Value Date   WBC 8.5 10/08/2014   HGB 13.0 10/08/2014   HCT 39.3 10/08/2014   PLT 229 10/08/2014   GLUCOSE 92 10/08/2014   CHOL 143 10/08/2014   TRIG 88 10/08/2014   HDL 36* 10/08/2014   LDLCALC 89 10/08/2014   ALT 24 10/07/2014   AST 33 10/07/2014   NA 142  10/08/2014   K 3.5 10/08/2014   CL 110 10/08/2014   CREATININE 1.30 10/08/2014   BUN 13 10/08/2014   CO2 21 10/08/2014   TSH 3.043 10/07/2014   INR 1.15 10/07/2014    Assessment / Plan:      1. CAD/NSTEMI- severe multivessel CAD with LM involvement, Impella placed- will plan for Emergent CABG procedure with Dr. Donata Clay Agree with above findings Coronary angiograms reviewed Patient discussed with D Bartle Plan emergent high risk CABG in this patient with acute MI, severe LV dysfunction on LVAD  For post- MI cardiogenic shock      @ME1 @ 10/08/2014 3:43 PM

## 2014-10-08 NOTE — OR Nursing (Signed)
SICU first call @ 2229

## 2014-10-08 NOTE — Brief Op Note (Signed)
10/07/2014 - 10/08/2014  9:41 PM  PATIENT:  Anthony Vasquez  69 y.o. male  PRE-OPERATIVE DIAGNOSIS:  CAD ICM  POST-OPERATIVE DIAGNOSIS:  CAD ICM  PROCEDURE:  Procedure(s) with comments:  CORONARY ARTERY BYPASS GRAFTING x 3 -LIMA to LAD -SVG to OM1 -SVG to DISTAL CIRCUMFLEX  ENDOSCOPIC SAPHENOUS VEIN HARVEST LEFT LEG  TRANSESOPHAGEAL ECHOCARDIOGRAM (TEE) (N/A)  SURGEON:  Surgeon(s) and Role:    * Kerin Perna, MD - Primary  PHYSICIAN ASSISTANT: Lowella Dandy PA-C  ANESTHESIA:   general  EBL:  Total I/O In: 750 [I.V.:500; IV Piggyback:250] Out: 500 [Urine:500]  BLOOD ADMINISTERED:2 Units PRBC,  CELLSAVER, 1 FFP and 1 PLTS  DRAINS: Left Pleural Chest Tube, Mediastinal Chest Drains   LOCAL MEDICATIONS USED:  NONE  SPECIMEN:  No Specimen  DISPOSITION OF SPECIMEN:  N/A  COUNTS:  YES  TOURNIQUET:  * No tourniquets in log *  DICTATION: .Dragon Dictation  PLAN OF CARE: Admit to inpatient   PATIENT DISPOSITION:  ICU - intubated and hemodynamically stable.   Delay start of Pharmacological VTE agent (>24hrs) due to surgical blood loss or risk of bleeding: yes

## 2014-10-08 NOTE — Research (Signed)
Bioflow Informed Consent   Subject Name: Bronte L Rust  Subject met inclusion and exclusion criteria.  The informed consent form, study requirements and expectations were reviewed with the subject and questions and concerns were addressed prior to the signing of the consent form.  The subject verbalized understanding of the trail requirements.  The subject agreed to participate in the Bioflow trial and signed the informed consent.  The informed consent was obtained prior to performance of any protocol-specific procedures for the subject.  A copy of the signed informed consent was given to the subject and a copy was placed in the subject's medical record.  Vivian Garman 10/08/2014, 11:20  

## 2014-10-08 NOTE — Progress Notes (Signed)
Patient states starting to feel better at this time. Impella pressure reads 113/76. AO pressure 108/78. Patient states no chest pain at this time. Patient HR 74. Patient remains on Non Re Breather and sats 100%. Rt DP/PT pulse 1+. No changes to Rt Femoral Artery Site from last assessment. Sheath in Rt Radial Artery remains sutured in and Level 0. Anesthesia remains at bedside working with patient to prepare for the OR. Family also at bedside. Continuing to monitor patient.

## 2014-10-08 NOTE — Progress Notes (Signed)
Patient Name: Anthony Vasquez Date of Encounter: 10/08/2014  Primary Cardiologist: New   Principal Problem:   NSTEMI (non-ST elevated myocardial infarction) Active Problems:   Hypertension   Dyslipidemia    SUBJECTIVE  States he has been having CP when mowing the lawn and exerting himself. Last episode of CP was yesterday. No further CP this morning. No SOB.   CURRENT MEDS . aspirin EC  81 mg Oral Daily  . atorvastatin  80 mg Oral q1800  . metoprolol tartrate  12.5 mg Oral BID    OBJECTIVE  Filed Vitals:   10/07/14 2130 10/07/14 2200 10/07/14 2247 10/08/14 0429  BP: 107/66 111/62 116/70 100/69  Pulse: 68 64 65 64  Temp:   98.4 F (36.9 C) 98.4 F (36.9 C)  TempSrc:   Oral Oral  Resp: 16 15 18 17   Height:      Weight:   163 lb (73.936 kg) 163 lb 1.6 oz (73.982 kg)  SpO2: 96% 96% 97% 95%    Intake/Output Summary (Last 24 hours) at 10/08/14 0758 Last data filed at 10/07/14 2255  Gross per 24 hour  Intake      0 ml  Output    300 ml  Net   -300 ml   Filed Weights   10/07/14 2103 10/07/14 2247 10/08/14 0429  Weight: 155 lb (70.308 kg) 163 lb (73.936 kg) 163 lb 1.6 oz (73.982 kg)    PHYSICAL EXAM  General: Pleasant, NAD. Neuro: Alert and oriented X 3. Moves all extremities spontaneously. Psych: Normal affect. HEENT:  Normal  Neck: Supple without bruits or JVD. Lungs:  Resp regular and unlabored, CTA. Heart: RRR no s3, s4, or murmurs. Abdomen: Soft, non-tender, non-distended, BS + x 4.  Extremities: No clubbing, cyanosis or edema. DP/PT/Radials 2+ and equal bilaterally.  Accessory Clinical Findings  CBC  Recent Labs  10/07/14 1718 10/08/14 0528  WBC 7.7 8.5  HGB 13.6 13.0  HCT 41.7 39.3  MCV 86.7 85.2  PLT 265 229   Basic Metabolic Panel  Recent Labs  10/07/14 1718 10/07/14 2354  NA 142 143  K 3.7 3.5  CL 110 111  CO2 27 24  GLUCOSE 82 91  BUN 17 12  CREATININE 1.41* 1.38*  CALCIUM 9.8 9.1   Liver Function Tests  Recent Labs  10/07/14 2354  AST 33  ALT 24  ALKPHOS 51  BILITOT 0.8  PROT 6.1  ALBUMIN 3.5   Cardiac Enzymes  Recent Labs  10/07/14 2354  TROPONINI 2.68*   Thyroid Function Tests  Recent Labs  10/07/14 2354  TSH 3.043    TELE NSR with HR 60s    ECG  NSR with possible Q wave in V1-V3 unchanged from yesterday, mild ST depression in inferolateral leads which has improved on repeat EKG   Radiology/Studies  Dg Chest 2 View  10/07/2014   CLINICAL DATA:  Four day history of dizziness and chest pain  EXAM: CHEST  2 VIEW  COMPARISON:  September 19, 2010  FINDINGS: There is minimal scarring left base. There is no edema or consolidation. Heart size and pulmonary vascularity are normal. No adenopathy. No pneumothorax. No bone lesions.  IMPRESSION: Minimal scarring left base.  No edema or consolidation.   Electronically Signed   By: Bretta Bang III M.D.   On: 10/07/2014 18:44    ASSESSMENT AND PLAN  1. NSTEMI  - preceeded by 1 mo exercise induced discomfort  - pending cath today, discussed benefit and risk of the  procedure include bleeding, renal/vascular, arrhythmia, MI, stroke, loss of life or limb, he displayed clear understanding and agree to proceed  - denies any upcoming surgery, h/o stroke or h/o major bleed, no contraindication to DES   2. HTN 3. HLD 4. CKD stage III  - appear to be close to baseline compare to lab from 2 yrs ago  Signed, Azalee Course PA-C Pager: [phone] Agree with above. EKGs personally reviewed. The more recent shows slightly improved ST depression compared to earlier one. Lungs clear. Heart no gallop. Plan: Left heart cath/PCI today.

## 2014-10-08 NOTE — OR Nursing (Signed)
SICU called @ 2227 to inform the family patient off pump

## 2014-10-08 NOTE — Progress Notes (Signed)
  Echocardiogram Echocardiogram Transesophageal has been performed.  Janalyn Harder 10/08/2014, 6:35 PM

## 2014-10-08 NOTE — Anesthesia Procedure Notes (Addendum)
Procedure Name: Intubation Date/Time: 10/08/2014 5:35 PM Performed by: Daiva Eves Pre-anesthesia Checklist: Patient identified, Timeout performed, Suction available, Emergency Drugs available and Patient being monitored Patient Re-evaluated:Patient Re-evaluated prior to inductionOxygen Delivery Method: Circle system utilized Preoxygenation: Pre-oxygenation with 100% oxygen Intubation Type: IV induction Ventilation: Mask ventilation without difficulty Laryngoscope Size: Mac and 4 Grade View: Grade II Tube type: Subglottic suction tube Tube size: 8.0 mm Number of attempts: 1 Airway Equipment and Method: Stylet Placement Confirmation: ETT inserted through vocal cords under direct vision,  breath sounds checked- equal and bilateral and positive ETCO2 Secured at: 22 cm Tube secured with: Tape Dental Injury: Teeth and Oropharynx as per pre-operative assessment

## 2014-10-08 NOTE — Progress Notes (Signed)
ANTICOAGULATION CONSULT NOTE - Initial Consult  Pharmacy Consult for Heparin Indication: chest pain/ACS  No Known Allergies  Patient Measurements: Height: 5\' 6"  (167.6 cm) Weight: 163 lb 1.6 oz (73.982 kg) IBW/kg (Calculated) : 63.8 Heparin Dosing Weight: actual weight  Vital Signs: Temp: 98.4 F (36.9 C) (03/17 0429) Temp Source: Oral (03/17 0429) BP: 100/69 mmHg (03/17 0429) Pulse Rate: 64 (03/17 0429)  Labs:  Recent Labs  10/07/14 1718 10/07/14 2121 10/07/14 2354 10/08/14 0528  HGB 13.6  --   --  13.0  HCT 41.7  --   --  39.3  PLT 265  --   --  229  APTT  --  29  --   --   LABPROT  --  14.1 14.9  --   INR  --  1.07 1.15  --   HEPARINUNFRC  --   --   --  0.15*  CREATININE 1.41*  --  1.38* 1.30  TROPONINI  --   --  2.68* 3.53*    Estimated Creatinine Clearance: 49.1 mL/min (by C-G formula based on Cr of 1.3).   Medical History: Past Medical History  Diagnosis Date  . Hypertension   . Renal disorder   . Myocardial infarction 09/2014    nstemi    Medications:  Scheduled:  Infusions:   Assessment: 75 yoM [facility] ED for dizziness and exertional chest pain with concern for ACS.  PMH is significant for renal d/o, HTN, HLD. Currently on IV at 850 units/hr and AM HL was sub-therapeutic at 0.15. H/H and Plt remain wnl. RN reports no s/s of bleeding. Pending cath today.   Goal of Therapy:  Heparin level 0.3-0.7 units/ml Monitor platelets by anticoagulation protocol: Yes   Plan:   Re-bolus heparin 2000 units IV  Increase heparin IV infusion at 1050 units/hr  Heparin level 6 hours after starting  Daily heparin level and CBC  F/u after cath   Vinnie Level, PharmD., BCPS Clinical Pharmacist Phone [phone]

## 2014-10-08 NOTE — Progress Notes (Signed)
Patient states starting to feel dizzy and starting to sweat. Pressure reading on Impella dropped to 66/40. AO reads 60/40.  Rt groin remains soft. Dr Eldridge Dace paged and by bed side at this time. Patient remains on Non-Re breather at this time sat 100%. Anesthesia by patient side working to get patient ready to transfer to the OR at this time.

## 2014-10-08 NOTE — Progress Notes (Addendum)
Patient arrived to cath lab holding area to wait to go to Surgery. Patient states no chest pain at this time. 53F sheath in Rt femoral Artery sutured in place. Site soft with slight oozing noted with no hematoma. Impella Reads pressure of 133/66. AO pressure reads 130's/60's. 2+ Rt DP/PT pulse. Patient on Non Re breather sats at 100%.  Sheath  to Rt Radial Artery sutured in place. Wife by patients side.

## 2014-10-08 NOTE — Progress Notes (Signed)
Patient states starting to feel better. Impella reading 126/81(97). AO pressure 80/50. Rt groin remains stable. No changes to Rt Radial site. Patient being transferred to OR at this time.

## 2014-10-08 NOTE — Anesthesia Preprocedure Evaluation (Addendum)
Anesthesia Evaluation  Patient identified by MRN, date of birth, ID band Patient awake    Reviewed: Allergy & Precautions, NPO status , Patient's Chart, lab work & pertinent test results  Airway Mallampati: II  TM Distance: >3 FB Neck ROM: Full    Dental  (+) Teeth Intact, Dental Advisory Given   Pulmonary neg pulmonary ROS,          Cardiovascular hypertension, + CAD, + Past MI and +CHF     Neuro/Psych negative neurological ROS     GI/Hepatic negative GI ROS, Neg liver ROS,   Endo/Other  negative endocrine ROS  Renal/GU ARFRenal disease     Musculoskeletal negative musculoskeletal ROS (+)   Abdominal   Peds  Hematology negative hematology ROS (+)   Anesthesia Other Findings   Reproductive/Obstetrics                            Anesthesia Physical Anesthesia Plan  ASA: IV and emergent  Anesthesia Plan: General   Post-op Pain Management:    Induction: Intravenous  Airway Management Planned: Oral ETT  Additional Equipment: Arterial line, TEE, CVP, PA Cath and Ultrasound Guidance Line Placement  Intra-op Plan:   Post-operative Plan: Post-operative intubation/ventilation  Informed Consent: I have reviewed the patients History and Physical, chart, labs and discussed the procedure including the risks, benefits and alternatives for the proposed anesthesia with the patient or authorized representative who has indicated his/her understanding and acceptance.   Dental advisory given  Plan Discussed with: CRNA  Anesthesia Plan Comments:         Anesthesia Quick Evaluation

## 2014-10-08 NOTE — Progress Notes (Signed)
Utilization review completed.  

## 2014-10-08 NOTE — CV Procedure (Signed)
       PROCEDURE:  Left heart catheterization with selective coronary angiography, left ventriculogram.  Impella placement  INDICATIONS:  Non-STEMI, cardiogenic shock, acute systolic heart failure, persistent chest pain, pulmonary edema  The risks, benefits, and details of the procedure were explained to the patient.  The patient verbalized understanding and wanted to proceed.  Informed written consent was obtained.  PROCEDURE TECHNIQUE:  After Xylocaine anesthesia a 66F slender sheath was placed in the right radial artery with a single anterior needle wall stick.   IV Heparin was given.  Right coronary angiography was done using a Judkins R4 guide catheter.  Left coronary angiography was done using a Judkins L3.5 guide catheter.  Left ventriculography was done using a pigtail catheter.  At this point, the patient began having chest discomfort. His blood pressure was low. Due to his severe coronary artery disease and low ejection fraction, cardiac surgery was called emergently. He decided to place an Impella catheter. Right femoral access was obtained with a 5 French sheath. Serial dilation of the right femoral artery was performed. The palate sheath was placed. Pigtail catheter was advanced to the left ventricle. The Impella wire was then switched out for the pigtail.  The catheter was adjusted based on fluoroscopic placement. He was getting an output of 3.1-3.2 L/m. The him patella was secured. The right radial sheath was also left in place.   CONTRAST:  Total of 85 cc.  COMPLICATIONS:  None.    HEMODYNAMICS:  Aortic pressure was 85/53; LV pressure was 87/4; LVEDP 35.  There was no gradient between the left ventricle and aorta.    ANGIOGRAPHIC DATA:   The left main coronary artery is a very short vessel. There was plaque in the distal left main.  The left anterior descending artery is occluded proximally. There is heavy calcification noted. There is some collateral flow from the RCA to the  distal LAD.  The left circumflex artery is a large, dominant vessel. There is a proximal stenosis. This is at least 90% in severity with heavy calcification. Just after the medium size first obtuse marginal, there is another 80% stenosis. The remainder of the circumflex appears patent. There is a large second obtuse marginal which has mild disease. In the left PDA, there is competitive flow with some apparent collateral flow from the RCA territory.  The right coronary artery is a diffusely diseased vessel. It is likely nondominant with some collaterals to the distal circumflex system.  LEFT VENTRICULOGRAM:  Left ventricular angiogram was done in the 30 RAO projection revealed regional  wall motion abnormalities including anterior and apical hypokinesis of severe degree.  Overall left ventricular systolic function with an estimated ejection fraction of 15-20%.  LVEDP was 35 mmHg.    IMPRESSIONS:  1. Short left main coronary artery.   2. Severe three-vessel coronary artery disease as noted above. 3. Severely decreased left ventricular systolic function as noted above.  LVEDP 35 mmHg.  Ejection fraction 15-20%. 4 Successful placement of an Impella CP device from the right groin, with subsequent resolution of hypotension.    RECOMMENDATION:  Patient will have emergent bypass surgery tonight. Will likely need Impella in place for at least a day.  Further plans for Dr. Donata Clay.

## 2014-10-08 NOTE — Progress Notes (Signed)
Lab called, troponin was 2.68. MD notified. No new orders at this time. Will continue to monitor.

## 2014-10-08 NOTE — Clinical Documentation Improvement (Signed)
  Chart states "renal insufficiency" on admission with Cr 1.41, GFR 50/58 treated with NS 75 ml/hr IV. In the Coding world "renal insufficiency" equates to "disorder of kidney and ureter" which is nonspecific. If possible please clarify this term to better reflect severity of illness and risk of mortality.  . Document underlying condition(s) contributing/causing acute renal failure if known or suspected . Document if acute kidney injury (AKI) is due to traumatic injury or if due to a non-traumatic event . Document if acute renal failure is due to: --Acute tubular necrosis (ATN) --Acute cortical necrosis --Acute medullary necrosis --Other (specify) . Be specific with documentation --Acute renal insufficiency and acute kidney disease are not reported as acute renal failure . Document any associated diagnoses/conditions  Thank Harle Battiest RN CDI [phone] HIM department

## 2014-10-08 NOTE — Progress Notes (Addendum)
Patient states beginning to feel sweaty and dizzy again. Impella pressure reads 84/56 (64). AO pressure reads 70/52 (56). Patient states no chest pain. Levophed started at 75mcg/min per Dr Eldridge Dace orders. Continuing to monitor patient.

## 2014-10-09 ENCOUNTER — Inpatient Hospital Stay (HOSPITAL_COMMUNITY): Payer: Medicare Other

## 2014-10-09 DIAGNOSIS — Z951 Presence of aortocoronary bypass graft: Secondary | ICD-10-CM

## 2014-10-09 LAB — PREPARE CRYOPRECIPITATE
UNIT DIVISION: 0
UNIT DIVISION: 0

## 2014-10-09 LAB — PREPARE PLATELET PHERESIS: Unit division: 0

## 2014-10-09 LAB — GLUCOSE, CAPILLARY
GLUCOSE-CAPILLARY: 102 mg/dL — AB (ref 70–99)
GLUCOSE-CAPILLARY: 140 mg/dL — AB (ref 70–99)
GLUCOSE-CAPILLARY: 128 mg/dL — AB (ref 70–99)
GLUCOSE-CAPILLARY: 120 mg/dL — AB (ref 70–99)
GLUCOSE-CAPILLARY: 131 mg/dL — AB (ref 70–99)
Glucose-Capillary: 147 mg/dL — ABNORMAL HIGH (ref 70–99)
Glucose-Capillary: 79 mg/dL (ref 70–99)
Glucose-Capillary: 80 mg/dL (ref 70–99)
Glucose-Capillary: 98 mg/dL (ref 70–99)
Glucose-Capillary: 129 mg/dL — ABNORMAL HIGH (ref 70–99)
Glucose-Capillary: 120 mg/dL — ABNORMAL HIGH (ref 70–99)
Glucose-Capillary: 122 mg/dL — ABNORMAL HIGH (ref 70–99)
Glucose-Capillary: 116 mg/dL — ABNORMAL HIGH (ref 70–99)
Glucose-Capillary: 115 mg/dL — ABNORMAL HIGH (ref 70–99)
Glucose-Capillary: 134 mg/dL — ABNORMAL HIGH (ref 70–99)
Glucose-Capillary: 126 mg/dL — ABNORMAL HIGH (ref 70–99)
Glucose-Capillary: 116 mg/dL — ABNORMAL HIGH (ref 70–99)
Glucose-Capillary: 165 mg/dL — ABNORMAL HIGH (ref 70–99)

## 2014-10-09 LAB — POCT I-STAT 3, ART BLOOD GAS (G3+)
ACID-BASE DEFICIT: 5 mmol/L — AB (ref 0.0–2.0)
Acid-base deficit: 3 mmol/L — ABNORMAL HIGH (ref 0.0–2.0)
Acid-base deficit: 5 mmol/L — ABNORMAL HIGH (ref 0.0–2.0)
BICARBONATE: 20.7 meq/L (ref 20.0–24.0)
Bicarbonate: 20.5 mEq/L (ref 20.0–24.0)
Bicarbonate: 22.8 mEq/L (ref 20.0–24.0)
Bicarbonate: 24.9 mEq/L — ABNORMAL HIGH (ref 20.0–24.0)
O2 SAT: 93 %
O2 Saturation: 100 %
O2 Saturation: 98 %
O2 Saturation: 98 %
PH ART: 7.344 — AB (ref 7.350–7.450)
PO2 ART: 71 mmHg — AB (ref 80.0–100.0)
Patient temperature: 37.1
Patient temperature: 37.7
TCO2: 22 mmol/L (ref 0–100)
TCO2: 22 mmol/L (ref 0–100)
TCO2: 24 mmol/L (ref 0–100)
TCO2: 26 mmol/L (ref 0–100)
pCO2 arterial: 38 mmHg (ref 35.0–45.0)
pCO2 arterial: 38.7 mmHg (ref 35.0–45.0)
pCO2 arterial: 41.4 mmHg (ref 35.0–45.0)
pCO2 arterial: 41.9 mmHg (ref 35.0–45.0)
pH, Arterial: 7.342 — ABNORMAL LOW (ref 7.350–7.450)
pH, Arterial: 7.339 — ABNORMAL LOW (ref 7.350–7.450)
pH, Arterial: 7.392 (ref 7.350–7.450)
pO2, Arterial: 103 mmHg — ABNORMAL HIGH (ref 80.0–100.0)
pO2, Arterial: 118 mmHg — ABNORMAL HIGH (ref 80.0–100.0)
pO2, Arterial: 175 mmHg — ABNORMAL HIGH (ref 80.0–100.0)

## 2014-10-09 LAB — APTT
aPTT: 41 seconds — ABNORMAL HIGH (ref 24–37)
aPTT: 39 seconds — ABNORMAL HIGH (ref 24–37)
aPTT: 46 seconds — ABNORMAL HIGH (ref 24–37)

## 2014-10-09 LAB — POCT I-STAT 7, (LYTES, BLD GAS, ICA,H+H)
Acid-base deficit: 4 mmol/L — ABNORMAL HIGH (ref 0.0–2.0)
Bicarbonate: 22 mEq/L (ref 20.0–24.0)
CALCIUM ION: 1.24 mmol/L (ref 1.13–1.30)
HCT: 27 % — ABNORMAL LOW (ref 39.0–52.0)
Hemoglobin: 9.2 g/dL — ABNORMAL LOW (ref 13.0–17.0)
O2 Saturation: 97 %
PCO2 ART: 42.9 mmHg (ref 35.0–45.0)
PH ART: 7.318 — AB (ref 7.350–7.450)
POTASSIUM: 3.1 mmol/L — AB (ref 3.5–5.1)
SODIUM: 141 mmol/L (ref 135–145)
TCO2: 23 mmol/L (ref 0–100)
pO2, Arterial: 101 mmHg — ABNORMAL HIGH (ref 80.0–100.0)

## 2014-10-09 LAB — BASIC METABOLIC PANEL
Anion gap: 6 (ref 5–15)
BUN: 11 mg/dL (ref 6–23)
CALCIUM: 8.1 mg/dL — AB (ref 8.4–10.5)
CO2: 25 mmol/L (ref 19–32)
Chloride: 110 mmol/L (ref 96–112)
Creatinine, Ser: 1.55 mg/dL — ABNORMAL HIGH (ref 0.50–1.35)
GFR, EST AFRICAN AMERICAN: 51 mL/min — AB (ref >90)
GFR, EST NON AFRICAN AMERICAN: 44 mL/min — AB (ref >90)
GLUCOSE: 75 mg/dL (ref 70–99)
Potassium: 2.6 mmol/L — CL (ref 3.5–5.1)
SODIUM: 141 mmol/L (ref 135–145)

## 2014-10-09 LAB — CBC
HCT: 29.3 % — ABNORMAL LOW (ref 39.0–52.0)
HCT: 28.6 % — ABNORMAL LOW (ref 39.0–52.0)
HEMATOCRIT: 29.5 % — AB (ref 39.0–52.0)
HEMOGLOBIN: 9.7 g/dL — AB (ref 13.0–17.0)
Hemoglobin: 10 g/dL — ABNORMAL LOW (ref 13.0–17.0)
Hemoglobin: 10.2 g/dL — ABNORMAL LOW (ref 13.0–17.0)
MCH: 28.3 pg (ref 26.0–34.0)
MCH: 28.7 pg (ref 26.0–34.0)
MCH: 28.8 pg (ref 26.0–34.0)
MCHC: 33.9 g/dL (ref 30.0–36.0)
MCHC: 34.1 g/dL (ref 30.0–36.0)
MCHC: 34.6 g/dL (ref 30.0–36.0)
MCV: 84.2 fL (ref 78.0–100.0)
MCV: 84.9 fL (ref 78.0–100.0)
MCV: 81.7 fL (ref 78.0–100.0)
Platelets: 109 10*3/uL — ABNORMAL LOW (ref 150–400)
Platelets: 117 10*3/uL — ABNORMAL LOW (ref 150–400)
Platelets: 105 10*3/uL — ABNORMAL LOW (ref 150–400)
RBC: 3.37 MIL/uL — ABNORMAL LOW (ref 4.22–5.81)
RBC: 3.48 MIL/uL — ABNORMAL LOW (ref 4.22–5.81)
RBC: 3.61 MIL/uL — AB (ref 4.22–5.81)
RDW: 13.4 % (ref 11.5–15.5)
RDW: 13.5 % (ref 11.5–15.5)
RDW: 13.7 % (ref 11.5–15.5)
WBC: 13.3 10*3/uL — ABNORMAL HIGH (ref 4.0–10.5)
WBC: 21.2 10*3/uL — ABNORMAL HIGH (ref 4.0–10.5)
WBC: 16.2 10*3/uL — ABNORMAL HIGH (ref 4.0–10.5)

## 2014-10-09 LAB — POCT I-STAT 4, (NA,K, GLUC, HGB,HCT)
GLUCOSE: 119 mg/dL — AB (ref 70–99)
GLUCOSE: 128 mg/dL — AB (ref 70–99)
HCT: 28 % — ABNORMAL LOW (ref 39.0–52.0)
HEMATOCRIT: 25 % — AB (ref 39.0–52.0)
HEMOGLOBIN: 8.5 g/dL — AB (ref 13.0–17.0)
HEMOGLOBIN: 9.5 g/dL — AB (ref 13.0–17.0)
POTASSIUM: 3.4 mmol/L — AB (ref 3.5–5.1)
Potassium: 3.4 mmol/L — ABNORMAL LOW (ref 3.5–5.1)
SODIUM: 140 mmol/L (ref 135–145)
Sodium: 142 mmol/L (ref 135–145)

## 2014-10-09 LAB — CREATININE, SERUM
Creatinine, Ser: 1.56 mg/dL — ABNORMAL HIGH (ref 0.50–1.35)
GFR calc Af Amer: 51 mL/min — ABNORMAL LOW (ref >90)
GFR, EST NON AFRICAN AMERICAN: 44 mL/min — AB (ref >90)

## 2014-10-09 LAB — POCT I-STAT, CHEM 8
BUN: 11 mg/dL (ref 6–23)
BUN: 10 mg/dL (ref 6–23)
CALCIUM ION: 1.04 mmol/L — AB (ref 1.13–1.30)
Calcium, Ion: 1.14 mmol/L (ref 1.13–1.30)
Chloride: 103 mmol/L (ref 96–112)
Chloride: 100 mmol/L (ref 96–112)
Creatinine, Ser: 1.3 mg/dL (ref 0.50–1.35)
Creatinine, Ser: 1.4 mg/dL — ABNORMAL HIGH (ref 0.50–1.35)
Glucose, Bld: 123 mg/dL — ABNORMAL HIGH (ref 70–99)
Glucose, Bld: 129 mg/dL — ABNORMAL HIGH (ref 70–99)
HCT: 27 % — ABNORMAL LOW (ref 39.0–52.0)
HCT: 30 % — ABNORMAL LOW (ref 39.0–52.0)
Hemoglobin: 9.2 g/dL — ABNORMAL LOW (ref 13.0–17.0)
Hemoglobin: 10.2 g/dL — ABNORMAL LOW (ref 13.0–17.0)
Potassium: 3.6 mmol/L (ref 3.5–5.1)
Potassium: 3.3 mmol/L — ABNORMAL LOW (ref 3.5–5.1)
SODIUM: 139 mmol/L (ref 135–145)
Sodium: 141 mmol/L (ref 135–145)
TCO2: 23 mmol/L (ref 0–100)
TCO2: 21 mmol/L (ref 0–100)

## 2014-10-09 LAB — PREPARE FRESH FROZEN PLASMA: Unit division: 0

## 2014-10-09 LAB — MAGNESIUM
Magnesium: 2.7 mg/dL — ABNORMAL HIGH (ref 1.5–2.5)
Magnesium: 2 mg/dL (ref 1.5–2.5)

## 2014-10-09 LAB — PROTIME-INR
INR: 0.96 (ref 0.00–1.49)
Prothrombin Time: 12.9 seconds (ref 11.6–15.2)

## 2014-10-09 LAB — POCT ACTIVATED CLOTTING TIME
ACTIVATED CLOTTING TIME: 128 s
Activated Clotting Time: 128 seconds
Activated Clotting Time: 140 seconds

## 2014-10-09 LAB — HEMOGLOBIN A1C
Hgb A1c MFr Bld: 5.7 % — ABNORMAL HIGH (ref 4.8–5.6)
Mean Plasma Glucose: 117 mg/dL

## 2014-10-09 MED ORDER — LACTATED RINGERS IV SOLN: 500.0000 mL | Freq: Once | INTRAVENOUS | Status: AC | PRN

## 2014-10-09 MED ORDER — MILRINONE IN DEXTROSE 20 MG/100ML IV SOLN
0.3000 ug/kg/min | INTRAVENOUS | Status: DC
  Administered 2014-10-09 – 2014-10-10 (×2): 0.125 ug/kg/min via INTRAVENOUS
  Filled 2014-10-09 (×2): qty 100

## 2014-10-09 MED ORDER — ACETAMINOPHEN 160 MG/5ML PO SOLN
1000.0000 mg | Freq: Four times a day (QID) | ORAL | Status: AC
  Administered 2014-10-09 – 2014-10-10 (×4): 1000 mg
  Filled 2014-10-09 (×3): qty 40.6

## 2014-10-09 MED ORDER — LACTATED RINGERS IV SOLN
INTRAVENOUS | Status: DC
  Administered 2014-10-10: 09:00:00 via INTRAVENOUS

## 2014-10-09 MED ORDER — BISACODYL 5 MG PO TBEC
10.0000 mg | DELAYED_RELEASE_TABLET | Freq: Every day | ORAL | Status: DC
  Administered 2014-10-09 – 2014-10-15 (×6): 10 mg via ORAL
  Filled 2014-10-09 (×8): qty 2

## 2014-10-09 MED ORDER — ASPIRIN 81 MG PO CHEW
324.0000 mg | CHEWABLE_TABLET | Freq: Every day | ORAL | Status: DC
Start: 2014-10-09 — End: 2014-10-16
  Administered 2014-10-09: 324 mg
  Filled 2014-10-09 (×2): qty 4

## 2014-10-09 MED ORDER — ACETAMINOPHEN 500 MG PO TABS
1000.0000 mg | ORAL_TABLET | Freq: Four times a day (QID) | ORAL | Status: AC
  Administered 2014-10-10 – 2014-10-13 (×11): 1000 mg via ORAL
  Filled 2014-10-09 (×20): qty 2

## 2014-10-09 MED ORDER — SODIUM CHLORIDE 0.9 % IV SOLN
INTRAVENOUS | Status: DC
  Filled 2014-10-09 (×2): qty 2.5

## 2014-10-09 MED ORDER — POTASSIUM CHLORIDE 10 MEQ/50ML IV SOLN
10.0000 meq | INTRAVENOUS | Status: AC
  Administered 2014-10-09 (×3): 10 meq via INTRAVENOUS

## 2014-10-09 MED ORDER — POTASSIUM CHLORIDE 10 MEQ/50ML IV SOLN
10.0000 meq | INTRAVENOUS | Status: DC | PRN
  Administered 2014-10-09 (×2): 10 meq via INTRAVENOUS

## 2014-10-09 MED ORDER — SODIUM CHLORIDE 0.9 % IJ SOLN
3.0000 mL | Freq: Two times a day (BID) | INTRAMUSCULAR | Status: DC
  Administered 2014-10-09 – 2014-10-12 (×6): 3 mL via INTRAVENOUS

## 2014-10-09 MED ORDER — MIDAZOLAM HCL 2 MG/2ML IJ SOLN
2.0000 mg | INTRAMUSCULAR | Status: DC | PRN
  Administered 2014-10-10: 2 mg via INTRAVENOUS
  Filled 2014-10-09: qty 2

## 2014-10-09 MED ORDER — VANCOMYCIN HCL IN DEXTROSE 1-5 GM/200ML-% IV SOLN
1000.0000 mg | Freq: Two times a day (BID) | INTRAVENOUS | Status: AC
  Administered 2014-10-09 – 2014-10-10 (×3): 1000 mg via INTRAVENOUS
  Filled 2014-10-09 (×3): qty 200

## 2014-10-09 MED ORDER — DEXMEDETOMIDINE HCL IN NACL 200 MCG/50ML IV SOLN
0.0000 ug/kg/h | INTRAVENOUS | Status: DC
  Administered 2014-10-09 – 2014-10-10 (×6): 0.7 ug/kg/h via INTRAVENOUS
  Filled 2014-10-09 (×6): qty 50

## 2014-10-09 MED ORDER — AMIODARONE HCL IN DEXTROSE 360-4.14 MG/200ML-% IV SOLN
30.0000 mg/h | INTRAVENOUS | Status: DC
  Administered 2014-10-09: 60 mg/h via INTRAVENOUS
  Administered 2014-10-09 – 2014-10-12 (×5): 30 mg/h via INTRAVENOUS
  Filled 2014-10-09 (×15): qty 200

## 2014-10-09 MED ORDER — SODIUM CHLORIDE 0.45 % IV SOLN
INTRAVENOUS | Status: DC | PRN
  Administered 2014-10-09: 20 mL/h via INTRAVENOUS
  Administered 2014-10-10: 17:00:00 via INTRAVENOUS

## 2014-10-09 MED ORDER — OXYCODONE HCL 5 MG PO TABS
5.0000 mg | ORAL_TABLET | ORAL | Status: DC | PRN
  Administered 2014-10-11 – 2014-10-13 (×6): 5 mg via ORAL
  Filled 2014-10-09 (×6): qty 1

## 2014-10-09 MED ORDER — MORPHINE SULFATE 2 MG/ML IJ SOLN: 1.0000 mg | INTRAMUSCULAR | Status: AC | PRN

## 2014-10-09 MED ORDER — PHENYLEPHRINE HCL 10 MG/ML IJ SOLN: 0.0000 ug/min | INTRAVENOUS | Status: DC

## 2014-10-09 MED ORDER — NITROGLYCERIN IN D5W 200-5 MCG/ML-% IV SOLN: 0.0000 ug/min | INTRAVENOUS | Status: DC

## 2014-10-09 MED ORDER — ONDANSETRON HCL 4 MG/2ML IJ SOLN
4.0000 mg | Freq: Four times a day (QID) | INTRAMUSCULAR | Status: DC | PRN
  Administered 2014-10-10 – 2014-10-12 (×3): 4 mg via INTRAVENOUS
  Filled 2014-10-09 (×3): qty 2

## 2014-10-09 MED ORDER — SODIUM CHLORIDE 0.9 % IV SOLN: INTRAVENOUS | Status: DC

## 2014-10-09 MED ORDER — FUROSEMIDE 10 MG/ML IJ SOLN
5.0000 mg/h | INTRAVENOUS | Status: DC
  Administered 2014-10-09: 8 mg/h via INTRAVENOUS
  Administered 2014-10-10: 3 mg/h via INTRAVENOUS
  Administered 2014-10-11: 5 mg/h via INTRAVENOUS
  Filled 2014-10-09 (×4): qty 25

## 2014-10-09 MED ORDER — SODIUM CHLORIDE 0.9 % IJ SOLN: 3.0000 mL | INTRAMUSCULAR | Status: DC | PRN

## 2014-10-09 MED ORDER — HEPARIN (PORCINE) IN NACL 100-0.45 UNIT/ML-% IJ SOLN
800.0000 [IU]/h | INTRAMUSCULAR | Status: DC
  Filled 2014-10-09: qty 250

## 2014-10-09 MED ORDER — SODIUM BICARBONATE 8.4 % IV SOLN
50.0000 meq | Freq: Once | INTRAVENOUS | Status: AC
  Administered 2014-10-09: 50 meq via INTRAVENOUS
  Filled 2014-10-09: qty 50

## 2014-10-09 MED ORDER — CHLORHEXIDINE GLUCONATE 0.12 % MT SOLN
15.0000 mL | Freq: Two times a day (BID) | OROMUCOSAL | Status: DC
  Administered 2014-10-09 – 2014-10-10 (×3): 15 mL via OROMUCOSAL
  Filled 2014-10-09 (×3): qty 15

## 2014-10-09 MED ORDER — CETYLPYRIDINIUM CHLORIDE 0.05 % MT LIQD
7.0000 mL | Freq: Four times a day (QID) | OROMUCOSAL | Status: DC
  Administered 2014-10-09 – 2014-10-10 (×5): 7 mL via OROMUCOSAL

## 2014-10-09 MED ORDER — LACTATED RINGERS IV SOLN: INTRAVENOUS | Status: DC

## 2014-10-09 MED ORDER — ALBUMIN HUMAN 5 % IV SOLN
12.5000 g | Freq: Once | INTRAVENOUS | Status: AC
  Administered 2014-10-09: 12.5 g via INTRAVENOUS

## 2014-10-09 MED ORDER — DOPAMINE-DEXTROSE 3.2-5 MG/ML-% IV SOLN
3.0000 ug/kg/min | INTRAVENOUS | Status: AC
  Administered 2014-10-10 (×2): 5 ug/kg/min via INTRAVENOUS
  Administered 2014-10-12: 4 ug/kg/min via INTRAVENOUS
  Filled 2014-10-09 (×2): qty 250

## 2014-10-09 MED ORDER — ACETAMINOPHEN 650 MG RE SUPP
650.0000 mg | Freq: Once | RECTAL | Status: AC
  Administered 2014-10-09: 650 mg via RECTAL

## 2014-10-09 MED ORDER — POTASSIUM CHLORIDE 10 MEQ/50ML IV SOLN
10.0000 meq | INTRAVENOUS | Status: AC
  Administered 2014-10-09 (×2): 10 meq via INTRAVENOUS
  Filled 2014-10-09: qty 50

## 2014-10-09 MED ORDER — ACETAMINOPHEN 160 MG/5ML PO SOLN: 650.0000 mg | Freq: Once | ORAL | Status: AC

## 2014-10-09 MED ORDER — DEXTROSE 5 % IV SOLN
1.5000 g | Freq: Two times a day (BID) | INTRAVENOUS | Status: AC
  Administered 2014-10-09 – 2014-10-10 (×4): 1.5 g via INTRAVENOUS
  Filled 2014-10-09 (×4): qty 1.5

## 2014-10-09 MED ORDER — ALBUMIN HUMAN 5 % IV SOLN
250.0000 mL | INTRAVENOUS | Status: AC | PRN
  Administered 2014-10-09 (×2): 250 mL via INTRAVENOUS
  Filled 2014-10-09: qty 250

## 2014-10-09 MED ORDER — METOCLOPRAMIDE HCL 5 MG/ML IJ SOLN
10.0000 mg | Freq: Four times a day (QID) | INTRAMUSCULAR | Status: AC
  Administered 2014-10-09 (×4): 10 mg via INTRAVENOUS
  Filled 2014-10-09 (×3): qty 2

## 2014-10-09 MED ORDER — METOPROLOL TARTRATE 25 MG/10 ML ORAL SUSPENSION
12.5000 mg | Freq: Two times a day (BID) | ORAL | Status: DC
  Filled 2014-10-09 (×4): qty 5

## 2014-10-09 MED ORDER — PANTOPRAZOLE SODIUM 40 MG PO TBEC
40.0000 mg | DELAYED_RELEASE_TABLET | Freq: Every day | ORAL | Status: DC
  Administered 2014-10-10 – 2014-10-16 (×7): 40 mg via ORAL
  Filled 2014-10-09 (×6): qty 1

## 2014-10-09 MED ORDER — ALBUMIN HUMAN 5 % IV SOLN
INTRAVENOUS | Status: AC
  Filled 2014-10-09: qty 250

## 2014-10-09 MED ORDER — VANCOMYCIN HCL IN DEXTROSE 1-5 GM/200ML-% IV SOLN
1000.0000 mg | Freq: Once | INTRAVENOUS | Status: DC
  Filled 2014-10-09: qty 200

## 2014-10-09 MED ORDER — DOCUSATE SODIUM 100 MG PO CAPS
200.0000 mg | ORAL_CAPSULE | Freq: Every day | ORAL | Status: DC
  Administered 2014-10-10 – 2014-10-16 (×7): 200 mg via ORAL
  Filled 2014-10-09 (×7): qty 2

## 2014-10-09 MED ORDER — NOREPINEPHRINE BITARTRATE 1 MG/ML IV SOLN
0.0000 ug/min | INTRAVENOUS | Status: DC
  Administered 2014-10-09: 40 ug/min via INTRAVENOUS
  Administered 2014-10-10: 42 ug/min via INTRAVENOUS
  Administered 2014-10-10: 28 ug/min via INTRAVENOUS
  Administered 2014-10-10: 22 ug/min via INTRAVENOUS
  Administered 2014-10-11: 12 ug/min via INTRAVENOUS
  Filled 2014-10-09 (×5): qty 16

## 2014-10-09 MED ORDER — POTASSIUM CHLORIDE 10 MEQ/50ML IV SOLN
10.0000 meq | INTRAVENOUS | Status: AC | PRN
  Administered 2014-10-10 (×3): 10 meq via INTRAVENOUS

## 2014-10-09 MED ORDER — ASPIRIN EC 325 MG PO TBEC
325.0000 mg | DELAYED_RELEASE_TABLET | Freq: Every day | ORAL | Status: DC
  Administered 2014-10-10 – 2014-10-16 (×7): 325 mg via ORAL
  Filled 2014-10-09 (×8): qty 1

## 2014-10-09 MED ORDER — HEPARIN SODIUM (PORCINE) 5000 UNIT/ML IJ SOLN
25000.0000 [IU] | INTRAVENOUS | Status: DC
  Administered 2014-10-09: 25000 [IU]
  Filled 2014-10-09 (×3): qty 5

## 2014-10-09 MED ORDER — INSULIN REGULAR BOLUS VIA INFUSION
0.0000 [IU] | Freq: Three times a day (TID) | INTRAVENOUS | Status: DC
  Filled 2014-10-09: qty 10

## 2014-10-09 MED ORDER — POTASSIUM CHLORIDE 10 MEQ/50ML IV SOLN
10.0000 meq | INTRAVENOUS | Status: AC
  Administered 2014-10-09 (×5): 10 meq via INTRAVENOUS
  Filled 2014-10-09 (×5): qty 50

## 2014-10-09 MED ORDER — MORPHINE SULFATE 2 MG/ML IJ SOLN
2.0000 mg | INTRAMUSCULAR | Status: DC | PRN
  Administered 2014-10-09 – 2014-10-11 (×6): 2 mg via INTRAVENOUS
  Filled 2014-10-09 (×7): qty 1

## 2014-10-09 MED ORDER — TRAMADOL HCL 50 MG PO TABS
50.0000 mg | ORAL_TABLET | ORAL | Status: DC | PRN
  Administered 2014-10-14: 50 mg via ORAL
  Filled 2014-10-09: qty 1

## 2014-10-09 MED ORDER — SODIUM CHLORIDE 0.9 % IV SOLN
250.0000 mL | INTRAVENOUS | Status: DC
  Administered 2014-10-11: 250 mL via INTRAVENOUS

## 2014-10-09 MED ORDER — METOPROLOL TARTRATE 12.5 MG HALF TABLET
12.5000 mg | ORAL_TABLET | Freq: Two times a day (BID) | ORAL | Status: DC
  Filled 2014-10-09 (×4): qty 1

## 2014-10-09 MED ORDER — HEPARIN (PORCINE) IN NACL 100-0.45 UNIT/ML-% IJ SOLN
200.0000 [IU]/h | INTRAMUSCULAR | Status: DC
  Filled 2014-10-09 (×3): qty 250

## 2014-10-09 MED ORDER — SODIUM CHLORIDE 0.9 % IJ SOLN
10.0000 mL | Freq: Two times a day (BID) | INTRAMUSCULAR | Status: DC
  Administered 2014-10-09 – 2014-10-12 (×4): 10 mL

## 2014-10-09 MED ORDER — NOREPINEPHRINE BITARTRATE 1 MG/ML IV SOLN
0.0000 ug/min | INTRAVENOUS | Status: DC
  Administered 2014-10-09 (×2): 40 ug/min via INTRAVENOUS
  Filled 2014-10-09 (×2): qty 16

## 2014-10-09 MED ORDER — BISACODYL 10 MG RE SUPP: 10.0000 mg | Freq: Every day | RECTAL | Status: DC

## 2014-10-09 MED ORDER — HEPARIN SODIUM (PORCINE) 5000 UNIT/ML IJ SOLN: 25000.0000 [IU] | INTRAVENOUS | Status: DC

## 2014-10-09 MED ORDER — SODIUM CHLORIDE 0.9 % IJ SOLN: 10.0000 mL | INTRAMUSCULAR | Status: DC | PRN

## 2014-10-09 MED ORDER — FAMOTIDINE IN NACL 20-0.9 MG/50ML-% IV SOLN
20.0000 mg | Freq: Two times a day (BID) | INTRAVENOUS | Status: AC
  Administered 2014-10-09 (×2): 20 mg via INTRAVENOUS
  Filled 2014-10-09: qty 50

## 2014-10-09 MED ORDER — METOPROLOL TARTRATE 1 MG/ML IV SOLN: 2.5000 mg | INTRAVENOUS | Status: DC | PRN

## 2014-10-09 MED ORDER — MAGNESIUM SULFATE 4 GM/100ML IV SOLN
4.0000 g | Freq: Once | INTRAVENOUS | Status: AC
Start: 2014-10-09 — End: 2014-10-09
  Administered 2014-10-09: 4 g via INTRAVENOUS
  Filled 2014-10-09: qty 100

## 2014-10-09 MED FILL — Sodium Chloride IV Soln 0.9%: INTRAVENOUS | Qty: 3000 | Status: AC

## 2014-10-09 MED FILL — Mannitol IV Soln 20%: INTRAVENOUS | Qty: 500 | Status: AC

## 2014-10-09 MED FILL — Lidocaine HCl IV Inj 20 MG/ML: INTRAVENOUS | Qty: 5 | Status: AC

## 2014-10-09 MED FILL — Sodium Bicarbonate IV Soln 8.4%: INTRAVENOUS | Qty: 100 | Status: AC

## 2014-10-09 MED FILL — Potassium Chloride Inj 2 mEq/ML: INTRAVENOUS | Qty: 40 | Status: AC

## 2014-10-09 MED FILL — Electrolyte-R (PH 7.4) Solution: INTRAVENOUS | Qty: 4000 | Status: AC

## 2014-10-09 MED FILL — Heparin Sodium (Porcine) Inj 1000 Unit/ML: INTRAMUSCULAR | Qty: 10 | Status: AC

## 2014-10-09 MED FILL — Magnesium Sulfate Inj 50%: INTRAMUSCULAR | Qty: 10 | Status: AC

## 2014-10-09 MED FILL — Heparin Sodium (Porcine) Inj 1000 Unit/ML: INTRAMUSCULAR | Qty: 30 | Status: AC

## 2014-10-09 NOTE — Progress Notes (Signed)
Patient ID: Anthony Vasquez, male   DOB: 03-02-1946, 69 y.o.   MRN: [id number]  SICU Evening Rounds:  Hemodynamics relatively stable with CI 3.4 but he is on Milrinone 0.125, dop 5 and levophed 45 mcg. PA 25/14, CVP 8. Impella functioning well.  Excellent diuresis today on lasix drip. Will decrease to 3 mg since he is putting out 300 cc per hr on 5 mg and is still on high dose levophed.  He is awake and looks comfortable on precedex.   Will wean precedex and vent early am for possible extubation in the am. CBC    Component Value Date/Time   WBC 16.2* 10/09/2014 1645   RBC 3.61* 10/09/2014 1645   HGB 10.2* 10/09/2014 1645   HCT 29.5* 10/09/2014 1645   PLT 105* 10/09/2014 1645   MCV 81.7 10/09/2014 1645   MCH 28.3 10/09/2014 1645   MCHC 34.6 10/09/2014 1645   RDW 13.7 10/09/2014 1645   LYMPHSABS 1.3 10/23/2009 1143   MONOABS 0.5 10/23/2009 1143   EOSABS 0.0 10/23/2009 1143   BASOSABS 0.0 10/23/2009 1143    BMET    Component Value Date/Time   NA 141 10/09/2014 1214   K 3.6 10/09/2014 1214   CL 103 10/09/2014 1214   CO2 25 10/09/2014 0500   GLUCOSE 123* 10/09/2014 1214   BUN 11 10/09/2014 1214   CREATININE 1.56* 10/09/2014 1645   CALCIUM 8.1* 10/09/2014 0500   GFRNONAA 44* 10/09/2014 1645   GFRAA 51* 10/09/2014 1645

## 2014-10-09 NOTE — Progress Notes (Signed)
CRITICAL VALUE ALERT  Critical value received:  K 2.6  Date of notification:  10/09/14  Time of notification:  0603  Critical value read back:Yes.    Nurse who received alert:  Devona Konig   MD notified (1st page):  KCL protocol started per order

## 2014-10-09 NOTE — Progress Notes (Signed)
Patient back from OR.  Called to take out right radial sheath.  6 french sheath removed and TR band placed with 12cc of air.  Nurse given instruction on band removal.  Site looks good.

## 2014-10-09 NOTE — Progress Notes (Signed)
Received in report from [facility] that Dr Donata Clay only wants heparin via purge fluid of Impella, not to give systemically tonight. Verified this with Dr Laneta Simmers who is covering for Dr Donata Clay this pm.

## 2014-10-09 NOTE — Anesthesia Postprocedure Evaluation (Signed)
Anesthesia Post Note  Patient: Anthony Vasquez  Procedure(s) Performed: Procedure(s) (LRB): CORONARY ARTERY BYPASS GRAFTING (CABG) (N/A) TRANSESOPHAGEAL ECHOCARDIOGRAM (TEE) (N/A)  Anesthesia type: General  Patient location: SICU  Post pain: Intubated/sedated  Post assessment: Post-op Vital signs reviewed  Last Vitals: BP 89/56 mmHg  Pulse 64  Temp(Src) 36.9 C (Oral)  Resp 17  Ht 5\' 6"  (1.676 m)  Wt 163 lb 1.6 oz (73.982 kg)  BMI 26.34 kg/m2  SpO2 99%  Post vital signs: Reviewed  Level of consciousness: Intubated/sedated  Complications: No apparent anesthesia complications

## 2014-10-09 NOTE — Progress Notes (Signed)
INITIAL NUTRITION ASSESSMENT  DOCUMENTATION CODES Per approved criteria  -Not Applicable   INTERVENTION: If pt remains intubated recommend RD consult:  Initiate Vital AF 1.2 @ 20 ml/hr via OG tube and increase by 10 ml every 4 hours to goal rate of 60 ml/hr.   Tube feeding regimen provides 1728 kcal (95% of needs), 108 grams of protein, and 1167 ml of H2O.   NUTRITION DIAGNOSIS: Inadequate oral intake related to inability to eat as evidenced by NPO status  Goal: Pt to meet >/= 90% of their estimated nutrition needs   Monitor:  Vent status, TF initiation, weight trends, labs  Reason for Assessment: Ventilator   69 y.o. male  Admitting Dx: NSTEMI (non-ST elevated myocardial infarction)  ASSESSMENT: Pt admitted with chest pain and had cath, pt placed on pump, which has now been removed and had CABG 3/17. Pt currently remains intubated and per RN no plans for extubation today.  Per RN pt started on lasix drip and hopefully help him with extubation.  Current weight likely skewed due to fluid.  Patient is currently intubated on ventilator support MV: 8 L/min Temp (24hrs), Avg:99.4 F (37.4 C), Min:98.1 F (36.7 C), Max:100.8 F (38.2 C)  Potassium low. No signs of fat or muscle depletion noted on exam.   Height: Ht Readings from Last 1 Encounters:  10/07/14  (1.676 m)    Weight: Wt Readings from Last 1 Encounters:  10/09/14 180 lb 1.9 oz (81.7 kg)  Admission weight: 163 lb (73.9 kg)  Ideal Body Weight: 64.5 kg   % Ideal Body Weight: 127%  Wt Readings from Last 10 Encounters:  10/09/14 180 lb 1.9 oz (81.7 kg)    Usual Body Weight: unknown  % Usual Body Weight: -  BMI:  Body mass index is 29.09 kg/(m^2).  Estimated Nutritional Needs: Kcal: 1813 Protein: 100-115 grams Fluid: > 1.8 L/day  Skin: incisions  Diet Order: Diet NPO time specified  EDUCATION NEEDS: -No education needs identified at this time   Intake/Output Summary (Last 24 hours) at  10/09/14 1415 Last data filed at 10/09/14 1400  Gross per 24 hour  Intake 10489.66 ml  Output   3670 ml  Net 6819.66 ml    Last BM: 3/17   Labs:   Recent Labs Lab 10/07/14 2354 10/08/14 0528  10/08/14 2310  10/09/14 0500 10/09/14 1100 10/09/14 1214  NA 143 142  < > 140  < > 141 142 141  K 3.5 3.5  < > 3.5  < > 2.6* 3.4* 3.6  CL 111 110  < > 101  --  110  --  103  CO2 24 21  --   --   --  25  --   --   BUN 12 13  < > 12  --  11  --  11  CREATININE 1.38* 1.30  < > 1.20  --  1.55*  --  1.30  CALCIUM 9.1 8.8  --   --   --  8.1*  --   --   MG  --   --   --   --   --  2.7*  --   --   GLUCOSE 91 92  < > 287*  --  75 119* 123*  < > = values in this interval not displayed.  CBG (last 3)  No results for input(s): GLUCAP in the last 72 hours.  Scheduled Meds: . acetaminophen  1,000 mg Oral 4 times per day  Or  . acetaminophen (TYLENOL) oral liquid 160 mg/5 mL  1,000 mg Per Tube 4 times per day  . antiseptic oral rinse  7 mL Mouth Rinse QID  . aspirin EC  325 mg Oral Daily   Or  . aspirin  324 mg Per Tube Daily  . atorvastatin  80 mg Oral q1800  . bisacodyl  10 mg Oral Daily   Or  . bisacodyl  10 mg Rectal Daily  . cefUROXime (ZINACEF)  IV  1.5 g Intravenous Q12H  . chlorhexidine  15 mL Mouth Rinse BID  . desmopressin (DDAVP) IV  20 mcg Intravenous Once  . docusate sodium  200 mg Oral Daily  . insulin regular  0-10 Units Intravenous TID WC  . metoCLOPramide (REGLAN) injection  10 mg Intravenous 4 times per day  . metoprolol tartrate  12.5 mg Oral BID   Or  . metoprolol tartrate  12.5 mg Per Tube BID  . [START ON 10/10/2014] pantoprazole  40 mg Oral Daily  . pneumococcal 23 valent vaccine  0.5 mL Intramuscular Tomorrow-1000  . sodium chloride  10-40 mL Intracatheter Q12H  . sodium chloride  3 mL Intravenous Q12H  . vancomycin  1,000 mg Intravenous Q12H    Continuous Infusions: . sodium chloride 20 mL/hr at 10/09/14 0700  . sodium chloride    . sodium chloride     . amiodarone 30 mg/hr (10/09/14 0900)  . dexmedetomidine 0.7 mcg/kg/hr (10/09/14 1300)  . DOPamine 3 mcg/kg/min (10/09/14 0100)  . furosemide (LASIX) infusion 8 mg/hr (10/09/14 1100)  . insulin (NOVOLIN-R) infusion 2.8 Units/hr (10/09/14 1400)  . lactated ringers 20 mL/hr at 10/09/14 0100  . lactated ringers 20 mL/hr at 10/09/14 0100  . milrinone 0.125 mcg/kg/min (10/09/14 1000)  . nitroGLYCERIN Stopped (10/09/14 0115)  . norepinephrine (LEVOPHED) Adult infusion 40 mcg/min (10/09/14 1400)  . phenylephrine (NEO-SYNEPHRINE) Adult infusion 20 mcg/min (10/09/14 1345)    Past Medical History  Diagnosis Date  . Hypertension   . Renal disorder   . Myocardial infarction 09/2014    nstemi    Past Surgical History  Procedure Laterality Date  . Tonsillectomy    . Colonoscopy  2015  . Left heart catheterization with coronary angiogram N/A 10/08/2014    Procedure: LEFT HEART CATHETERIZATION WITH CORONARY ANGIOGRAM;  Surgeon: Corky Crafts, MD;  Location: [facility] CATH LAB;  Service: Cardiovascular;  Laterality: N/A;    Kendell Bane RD, LDN, CNSC [phone] Pager [phone] After Hours Pager

## 2014-10-09 NOTE — Transfer of Care (Signed)
Immediate Anesthesia Transfer of Care Note  Patient: Anthony Vasquez  Procedure(s) Performed: Procedure(s) with comments: CORONARY ARTERY BYPASS GRAFTING (CABG) (N/A) - PATIENT IS GETTING AN IMPELLA IN THE CATH LAB CABG times 3 using left internal mammary artery and endoscopically harvested left saphenous vein. TRANSESOPHAGEAL ECHOCARDIOGRAM (TEE) (N/A)  Patient Location: SICU  Anesthesia Type:General  Level of Consciousness: Patient remains intubated per anesthesia plan  Airway & Oxygen Therapy: Patient remains intubated per anesthesia plan and Patient placed on Ventilator (see vital sign flow sheet for setting)  Post-op Assessment: Report given to RN and Post -op Vital signs reviewed and stable  Post vital signs: Reviewed and stable  Last Vitals:  Filed Vitals:   10/08/14 1700  BP: 89/56  Pulse: 64  Temp:   Resp: 17    Complications: No apparent anesthesia complications

## 2014-10-09 NOTE — Progress Notes (Signed)
1 Day Post-Op Procedure(s) (LRB): CORONARY ARTERY BYPASS GRAFTING (CABG) (N/A) TRANSESOPHAGEAL ECHOCARDIOGRAM (TEE) (N/A) Subjective: Acute MI status post emergency CABG with percutaneous LVAD Old anterior MI, chronic LV dysfunction Hemodynamically stable overnight. Pulmonary pressures significantly lowered after surgery and with percutaneous LVAD Objective: Vital signs in last 24 hours: Temp:  [98.1 F (36.7 C)-100.8 F (38.2 C)] 100.8 F (38.2 C) (03/18 1815) Pulse Rate:  [72-110] 91 (03/18 1815) Cardiac Rhythm:  [-] Atrial paced (03/18 1600) Resp:  [9-24] 22 (03/18 1815) BP: (89-123)/(54-74) 123/74 mmHg (03/18 1800) SpO2:  [87 %-100 %] 97 % (03/18 1815) Arterial Line BP: (85-130)/(50-72) 123/64 mmHg (03/18 1815) FiO2 (%):  [50 %-70 %] 50 % (03/18 1645) Weight:  [180 lb 1.9 oz (81.7 kg)] 180 lb 1.9 oz (81.7 kg) (03/18 0400)  Hemodynamic parameters for last 24 hours: PAP: (26-35)/(13-20) 26/14 mmHg CVP:  [8 mmHg-15 mmHg] 8 mmHg CO:  [4.7 L/min-7.1 L/min] 4.7 L/min CI:  [2.6 L/min/m2-3.9 L/min/m2] 2.6 L/min/m2  Intake/Output from previous day: 03/17 0701 - 03/18 0700 In: 9279.5 [I.V.:5543.8; Blood:1770; IV Piggyback:1850] Out: 2275 [Urine:1925; Chest Tube:350] Intake/Output this shift: Total I/O In: 2207.6 [I.V.:1389.7; Other:177.9; NG/GT:90; IV Piggyback:550] Out: 3015 [RDEYC:1448; Chest Tube:560]  Exam Sedated on ventilator Mild edema Scattered rhonchi Extremities warm  Lab Results:  Recent Labs  10/09/14 0500  10/09/14 1214 10/09/14 1645  WBC 21.2*  --   --  16.2*  HGB 9.7*  < > 9.2* 10.2*  HCT 28.6*  < > 27.0* 29.5*  PLT 117*  --   --  105*  < > = values in this interval not displayed. BMET:  Recent Labs  10/08/14 0528  10/09/14 0500 10/09/14 1100 10/09/14 1214 10/09/14 1645  NA 142  < > 141 142 141  --   K 3.5  < > 2.6* 3.4* 3.6  --   CL 110  < > 110  --  103  --   CO2 21  --  25  --   --   --   GLUCOSE 92  < > 75 119* 123*  --   BUN 13  < > 11   --  11  --   CREATININE 1.30  < > 1.55*  --  1.30 1.56*  CALCIUM 8.8  --  8.1*  --   --   --   < > = values in this interval not displayed.  PT/INR:  Recent Labs  10/09/14 0030  LABPROT 12.9  INR 0.96   ABG    Component Value Date/Time   PHART 7.339* 10/09/2014 0850   HCO3 20.7 10/09/2014 0850   TCO2 23 10/09/2014 1214   ACIDBASEDEF 5.0* 10/09/2014 0850   O2SAT 98.0 10/09/2014 0850   CBG (last 3)  No results for input(s): GLUCAP in the last 72 hours.  Assessment/Plan: S/P Procedure(s) (LRB): CORONARY ARTERY BYPASS GRAFTING (CABG) (N/A) TRANSESOPHAGEAL ECHOCARDIOGRAM (TEE) (N/A) Diuresis then wean ventilator. Patient on FiO2 70% this a.m. Continue percutaneous LVAD and start low-dose IV heparin Keep chest tubes and monitor output while heparin is started Plan on keeping percutaneous LVAD for 72 hours  LOS: 2 days    Kathlee Nations Trigt III 10/09/2014

## 2014-10-10 ENCOUNTER — Inpatient Hospital Stay (HOSPITAL_COMMUNITY): Payer: Medicare Other

## 2014-10-10 LAB — POCT I-STAT 3, ART BLOOD GAS (G3+)
ACID-BASE DEFICIT: 1 mmol/L (ref 0.0–2.0)
Acid-Base Excess: 1 mmol/L (ref 0.0–2.0)
Acid-Base Excess: 2 mmol/L (ref 0.0–2.0)
BICARBONATE: 23.6 meq/L (ref 20.0–24.0)
Bicarbonate: 21.7 mEq/L (ref 20.0–24.0)
Bicarbonate: 22.2 mEq/L (ref 20.0–24.0)
Bicarbonate: 23.3 mEq/L (ref 20.0–24.0)
Bicarbonate: 24.5 mEq/L — ABNORMAL HIGH (ref 20.0–24.0)
O2 SAT: 95 %
O2 SAT: 93 %
O2 Saturation: 94 %
O2 Saturation: 95 %
O2 Saturation: 96 %
PCO2 ART: 28.5 mmHg — AB (ref 35.0–45.0)
PH ART: 7.48 — AB (ref 7.350–7.450)
PO2 ART: 73 mmHg — AB (ref 80.0–100.0)
Patient temperature: 37.8
Patient temperature: 38
Patient temperature: 38.2
Patient temperature: 38.3
TCO2: 23 mmol/L (ref 0–100)
TCO2: 23 mmol/L (ref 0–100)
TCO2: 24 mmol/L (ref 0–100)
TCO2: 25 mmol/L (ref 0–100)
TCO2: 25 mmol/L (ref 0–100)
pCO2 arterial: 30.5 mmHg — ABNORMAL LOW (ref 35.0–45.0)
pCO2 arterial: 31.9 mmHg — ABNORMAL LOW (ref 35.0–45.0)
pCO2 arterial: 33.4 mmHg — ABNORMAL LOW (ref 35.0–45.0)
pCO2 arterial: 33.4 mmHg — ABNORMAL LOW (ref 35.0–45.0)
pH, Arterial: 7.457 — ABNORMAL HIGH (ref 7.350–7.450)
pH, Arterial: 7.463 — ABNORMAL HIGH (ref 7.350–7.450)
pH, Arterial: 7.479 — ABNORMAL HIGH (ref 7.350–7.450)
pH, Arterial: 7.504 — ABNORMAL HIGH (ref 7.350–7.450)
pO2, Arterial: 63 mmHg — ABNORMAL LOW (ref 80.0–100.0)
pO2, Arterial: 69 mmHg — ABNORMAL LOW (ref 80.0–100.0)
pO2, Arterial: 72 mmHg — ABNORMAL LOW (ref 80.0–100.0)
pO2, Arterial: 80 mmHg (ref 80.0–100.0)

## 2014-10-10 LAB — POCT I-STAT 4, (NA,K, GLUC, HGB,HCT)
GLUCOSE: 129 mg/dL — AB (ref 70–99)
GLUCOSE: 131 mg/dL — AB (ref 70–99)
GLUCOSE: 133 mg/dL — AB (ref 70–99)
GLUCOSE: 140 mg/dL — AB (ref 70–99)
HCT: 26 % — ABNORMAL LOW (ref 39.0–52.0)
HCT: 39 % (ref 39.0–52.0)
HEMATOCRIT: 24 % — AB (ref 39.0–52.0)
HEMATOCRIT: 25 % — AB (ref 39.0–52.0)
HEMOGLOBIN: 8.2 g/dL — AB (ref 13.0–17.0)
HEMOGLOBIN: 8.5 g/dL — AB (ref 13.0–17.0)
Hemoglobin: 13.3 g/dL (ref 13.0–17.0)
Hemoglobin: 8.8 g/dL — ABNORMAL LOW (ref 13.0–17.0)
POTASSIUM: 3.3 mmol/L — AB (ref 3.5–5.1)
POTASSIUM: 4 mmol/L (ref 3.5–5.1)
Potassium: 3.2 mmol/L — ABNORMAL LOW (ref 3.5–5.1)
Potassium: 3.4 mmol/L — ABNORMAL LOW (ref 3.5–5.1)
SODIUM: 133 mmol/L — AB (ref 135–145)
SODIUM: 134 mmol/L — AB (ref 135–145)
Sodium: 134 mmol/L — ABNORMAL LOW (ref 135–145)
Sodium: 138 mmol/L (ref 135–145)

## 2014-10-10 LAB — COMPREHENSIVE METABOLIC PANEL
ALT: 21 U/L (ref 0–53)
AST: 70 U/L — ABNORMAL HIGH (ref 0–37)
Albumin: 3.1 g/dL — ABNORMAL LOW (ref 3.5–5.2)
Alkaline Phosphatase: 34 U/L — ABNORMAL LOW (ref 39–117)
Anion gap: 7 (ref 5–15)
BUN: 6 mg/dL (ref 6–23)
CO2: 26 mmol/L (ref 19–32)
Calcium: 7.3 mg/dL — ABNORMAL LOW (ref 8.4–10.5)
Chloride: 100 mmol/L (ref 96–112)
Creatinine, Ser: 1.54 mg/dL — ABNORMAL HIGH (ref 0.50–1.35)
GFR calc Af Amer: 52 mL/min — ABNORMAL LOW (ref >90)
GFR calc non Af Amer: 45 mL/min — ABNORMAL LOW (ref >90)
Glucose, Bld: 132 mg/dL — ABNORMAL HIGH (ref 70–99)
Potassium: 3 mmol/L — ABNORMAL LOW (ref 3.5–5.1)
Sodium: 133 mmol/L — ABNORMAL LOW (ref 135–145)
Total Bilirubin: 2.5 mg/dL — ABNORMAL HIGH (ref 0.3–1.2)
Total Protein: 4.5 g/dL — ABNORMAL LOW (ref 6.0–8.3)

## 2014-10-10 LAB — APTT
aPTT: 59 seconds — ABNORMAL HIGH (ref 24–37)
aPTT: 86 seconds — ABNORMAL HIGH (ref 24–37)

## 2014-10-10 LAB — GLUCOSE, CAPILLARY
GLUCOSE-CAPILLARY: 118 mg/dL — AB (ref 70–99)
GLUCOSE-CAPILLARY: 126 mg/dL — AB (ref 70–99)
GLUCOSE-CAPILLARY: 127 mg/dL — AB (ref 70–99)
GLUCOSE-CAPILLARY: 128 mg/dL — AB (ref 70–99)
GLUCOSE-CAPILLARY: 132 mg/dL — AB (ref 70–99)
GLUCOSE-CAPILLARY: 136 mg/dL — AB (ref 70–99)
GLUCOSE-CAPILLARY: 138 mg/dL — AB (ref 70–99)
GLUCOSE-CAPILLARY: 138 mg/dL — AB (ref 70–99)
GLUCOSE-CAPILLARY: 139 mg/dL — AB (ref 70–99)
GLUCOSE-CAPILLARY: 141 mg/dL — AB (ref 70–99)
GLUCOSE-CAPILLARY: 142 mg/dL — AB (ref 70–99)
GLUCOSE-CAPILLARY: 142 mg/dL — AB (ref 70–99)
Glucose-Capillary: 137 mg/dL — ABNORMAL HIGH (ref 70–99)
Glucose-Capillary: 126 mg/dL — ABNORMAL HIGH (ref 70–99)
Glucose-Capillary: 125 mg/dL — ABNORMAL HIGH (ref 70–99)
Glucose-Capillary: 157 mg/dL — ABNORMAL HIGH (ref 70–99)
Glucose-Capillary: 134 mg/dL — ABNORMAL HIGH (ref 70–99)
Glucose-Capillary: 121 mg/dL — ABNORMAL HIGH (ref 70–99)
Glucose-Capillary: 127 mg/dL — ABNORMAL HIGH (ref 70–99)
Glucose-Capillary: 133 mg/dL — ABNORMAL HIGH (ref 70–99)
Glucose-Capillary: 134 mg/dL — ABNORMAL HIGH (ref 70–99)
Glucose-Capillary: 117 mg/dL — ABNORMAL HIGH (ref 70–99)
Glucose-Capillary: 117 mg/dL — ABNORMAL HIGH (ref 70–99)
Glucose-Capillary: 128 mg/dL — ABNORMAL HIGH (ref 70–99)

## 2014-10-10 LAB — CBC
HCT: 25.7 % — ABNORMAL LOW (ref 39.0–52.0)
Hemoglobin: 9 g/dL — ABNORMAL LOW (ref 13.0–17.0)
MCH: 28.8 pg (ref 26.0–34.0)
MCHC: 35 g/dL (ref 30.0–36.0)
MCV: 82.4 fL (ref 78.0–100.0)
Platelets: 77 10*3/uL — ABNORMAL LOW (ref 150–400)
RBC: 3.12 MIL/uL — ABNORMAL LOW (ref 4.22–5.81)
RDW: 13.8 % (ref 11.5–15.5)
WBC: 13.1 10*3/uL — ABNORMAL HIGH (ref 4.0–10.5)

## 2014-10-10 LAB — POCT ACTIVATED CLOTTING TIME
ACTIVATED CLOTTING TIME: 153 s
Activated Clotting Time: 153 seconds
Activated Clotting Time: 165 seconds

## 2014-10-10 LAB — HEMOGLOBIN A1C
HEMOGLOBIN A1C: 6 % — AB (ref 4.8–5.6)
Mean Plasma Glucose: 126 mg/dL

## 2014-10-10 MED ORDER — DEXTROSE 20 % IV SOLN
50000.0000 [IU] | INTRAVENOUS | Status: DC
  Administered 2014-10-10 – 2014-10-11 (×2): 50000 [IU]
  Filled 2014-10-10 (×2): qty 10

## 2014-10-10 MED ORDER — POTASSIUM CHLORIDE 10 MEQ/50ML IV SOLN
10.0000 meq | INTRAVENOUS | Status: AC | PRN
  Administered 2014-10-10 (×3): 10 meq via INTRAVENOUS
  Filled 2014-10-10 (×2): qty 50

## 2014-10-10 MED ORDER — CETYLPYRIDINIUM CHLORIDE 0.05 % MT LIQD
7.0000 mL | Freq: Two times a day (BID) | OROMUCOSAL | Status: DC
  Administered 2014-10-10 – 2014-10-13 (×7): 7 mL via OROMUCOSAL

## 2014-10-10 MED ORDER — POTASSIUM CHLORIDE 10 MEQ/50ML IV SOLN
10.0000 meq | INTRAVENOUS | Status: AC
  Administered 2014-10-10 (×5): 10 meq via INTRAVENOUS
  Filled 2014-10-10 (×5): qty 50

## 2014-10-10 MED ORDER — POTASSIUM CHLORIDE 10 MEQ/50ML IV SOLN
10.0000 meq | INTRAVENOUS | Status: AC | PRN
  Administered 2014-10-10 (×3): 10 meq via INTRAVENOUS
  Filled 2014-10-10 (×3): qty 50

## 2014-10-10 NOTE — Op Note (Signed)
NAME:  Anthony Vasquez, Anthony Vasquez NO.:  [id number]  MEDICAL RECORD NO.:  [id number]  LOCATION:  2S10C                        FACILITY:  [facility]  PHYSICIAN:  Kerin Perna, M.D.  DATE OF BIRTH:  1946-03-13  DATE OF PROCEDURE:  10/08/2014 DATE OF DISCHARGE:                              OPERATIVE REPORT   OPERATION: 1. Emergency coronary artery bypass grafting x3 (left internal mammary     artery to left anterior descending artery, saphenous vein graft to     ramus intermedius, saphenous vein graft to obtuse marginal). 2. Endoscopic harvest of left leg greater saphenous vein.  SURGEON:  Kerin Perna, M.D.  ASSISTANT:  Lowella Dandy, PA-C.  ANESTHESIA:  General.  INDICATIONS:  The patient is a 69 year old gentleman who presented to the emergency room with chest pain and positive cardiac enzymes and was felt to have a non-ST elevation MI.  He was admitted to the hospital and stabilized.  The next day, he underwent cardiac catheterization.  During the catheterization, he became unstable.  He developed chest pain. Cardiac catheterization demonstrated severe 95-99% left main stenosis. There was chronic LAD occlusion.  There was EF of 20%.  The patient had elevated filling pressures with LVEDP greater than 30.  Thoracic Surgery consultation was requested.  The patient was prepared for emergency multivessel CABG.  In the cath lab, an Impella CP was placed via the right femoral artery into the left ventricle to help stabilize his hemodynamics.  Prior to surgery, I discussed the situation with the patient and his family including the role of the emergency CABG to salvage myocardium, improve the survival, and help to relieve his symptoms.  The patient and family understood this was at the increased risk with a perioperative mortality of at least 10%.  They also understood there were other potential complications including bleeding, stroke, postoperative pulmonary problems  including pleural effusions, infection, and death.  Informed consent was obtained to proceed with surgery.  OPERATIVE PROCEDURE:  The patient was brought directly from the cath lab to the operating room.  He was in extremis.  General anesthesia was induced.  Invasive hemodynamic monitoring was initiated.  His PA pressures were extremely high, 60/30.  The patient was prepped and draped as a sterile field.  A proper time-out was performed.  A transesophageal echoprobe was placed by the anesthesiologist, which confirmed an ejection fraction of 20% with mild-moderate MR.  A sternal incision was made as the saphenous vein was harvested from the left leg.  The Impella catheter was in the right groin.  The left internal mammary artery was harvested as a pedicle graft from its origin at the subclavian vessels.  It was a small vessel, but had adequate flow.  The sternal retractor was placed and the pericardium was opened and suspended.  Pursestrings were placed in the ascending aorta and right atrium and after the vein had been harvested, the patient was systemically heparinized, cannulated and placed on cardiopulmonary bypass.  The coronary arteries were identified for grafting.  The LAD was intramyocardial, but graftable.  The anastomosis was placed distally.  The ramus intermedius and OM branch of the left circumflex were graftable.  The  best target was the distal circumflex OM.  The mammary artery and vein grafts were prepared for the distal anastomoses. The left ventricular vent was placed via the right superior pulmonary vein.  The patient was cooled to 32 degrees.  The aortic crossclamp was applied around the Impella catheter.  Antegrade cardioplegia was not feasible.  Retrograde cardioplegia through the coronary sinus catheter was completed, instilling 800 mL of cold blood cardioplegia with good cardioplegic arrest and septal temperature dropped less than 14 degrees. Cardioplegia was  delivered every 20 minutes.  The distal coronary anastomoses were performed.  The first distal anastomosis was to the distal circumflex marginal.  It was 1.5-mm vessel.  A reverse saphenous vein was sewn end-to-side with running 7-0 Prolene with good flow through the graft.  Cardioplegia was redosed.  The second distal anastomosis was the ramus intermedius branch to the left coronary artery.  It was 1.3-mm vessel with proximal high-grade stenosis.  A reverse saphenous vein was sewn end-to-side with running 7- 0 Prolene with good flow through the graft.  Cardioplegia was redosed.  The third distal anastomosis was the distal LAD.  It was intramyocardial.  It was 1.5-mm vessel.  The left IMA pedicle was brought through an opening in the left lateral pericardium, was brought down onto the LAD and sewn end-to-side with running 8-0 Prolene.  There was excellent flow through the anastomosis after briefly releasing the pedicle bulldog on the mammary artery.  The bulldog was reapplied and the pedicle was secured epicardium with 6-0 Prolene.  Cardioplegia was redosed.  While the crossclamp was still in place, two proximal vein anastomoses were performed on the ascending aorta using a 4.0-mm punch running 6-0 Prolene.  Prior to tying down the final proximal anastomosis, air was vented from the coronaries with a dose of retrograde warm blood cardioplegia.  The crossclamp was removed.  The heart was cardioverted back to a regular rhythm.  The vein grafts were de-aired and opened, each had good flow and hemostasis was documented at the proximal and distal sites.  The patient was rewarmed and reperfused.  Temporary pacing wires were applied.  The lungs were expanded and the ventilator was resumed.  The patient was then weaned off cardiopulmonary bypass after starting low-dose milrinone and dopamine.  The Impella catheter was turned back on to approximately 2.8 L/minute flow and the patient was  separated from cardiopulmonary bypass easily.  His PA pressures were still elevated, but were improved.  The transesophageal echoprobe showed improvement in global LV function. Protamine was administered without adverse reaction.  However, there was still significant coagulopathy.  The patient had severe coagulopathy. Platelets, FFP and cryoprecipitate were ordered.  The cannulation sites and coronary anastomoses were hemostatic; however, there was significant bleeding from the chest wall, the sternal edges of the mediastinal fat. Finally, a small dose of factor 7 was administered with improvement in coagulation function after the multiple blood products had been administered.  The mediastinum was irrigated and the superior pericardial fat was closed over the aorta.  An anterior mediastinal and bilateral pleural tubes were placed.  The sternum was closed with wire.  The pectoralis fascia was closed with running Vicryl.  The subcutaneous and skin layers were closed using running Vicryl and sterile dressings were applied. Total cardiopulmonary bypass time was 125 minutes.     Kerin Perna, M.D.     PV/MEDQ  D:  10/09/2014  T:  10/10/2014  Job:  [id number]  cc:   Corky Crafts,  MD

## 2014-10-10 NOTE — Procedures (Signed)
Extubation Procedure Note  Patient Details:   Name: Anthony Vasquez DOB: June 11, 1946 MRN: [id number]   Airway Documentation:  Airway 8 mm (Active)  Secured at (cm) 22 cm 10/10/2014  7:57 AM  Measured From Lips 10/10/2014  7:57 AM  Secured Location Center 10/10/2014  7:57 AM  Secured By [employer] 10/10/2014  7:57 AM  Tube Holder Repositioned Yes 10/10/2014  7:57 AM  Site Condition Dry 10/10/2014  7:57 AM    Evaluation  O2 sats: stable throughout Complications: No apparent complications Patient did tolerate procedure well. Bilateral Breath Sounds: Clear, Diminished Suctioning: Airway  Yes   Pt placed on 4L [place] initially but sats dropped to 86-88%.  Pt placed on 50% VM with sats of 96%.   Pt's NIF -38, VC 1.1. ABG given to MD and order was given   Tyna Jaksch 10/10/2014, 9:33 AM

## 2014-10-10 NOTE — Progress Notes (Signed)
2 Days Post-Op Procedure(s) (LRB): CORONARY ARTERY BYPASS GRAFTING (CABG) (N/A) TRANSESOPHAGEAL ECHOCARDIOGRAM (TEE) (N/A) Subjective:  No complaints, extubated this am.  Objective: Vital signs in last 24 hours: Temp:  [100.2 F (37.9 C)-101.1 F (38.4 C)] 100.4 F (38 C) (03/19 1400) Pulse Rate:  [75-96] 89 (03/19 1400) Cardiac Rhythm:  [-] Junctional rhythm (03/19 1200) Resp:  [14-29] 23 (03/19 1400) BP: (74-123)/(54-74) 88/60 mmHg (03/19 1400) SpO2:  [92 %-100 %] 97 % (03/19 1400) Arterial Line BP: (80-179)/(49-121) 93/57 mmHg (03/19 1400) FiO2 (%):  [40 %-60 %] 40 % (03/19 1300) Weight:  [81.5 kg (179 lb 10.8 oz)] 81.5 kg (179 lb 10.8 oz) (03/19 0230)  Hemodynamic parameters for last 24 hours: PAP: (22-44)/(11-24) 34/21 mmHg CVP:  [7 mmHg-19 mmHg] 16 mmHg CO:  [4.7 L/min-6.2 L/min] 5 L/min CI:  [2.6 L/min/m2-3.4 L/min/m2] 3 L/min/m2  Milrinone 0.125, dop 5, Levophed 25 mcg, Impella flow 3.8.  Intake/Output from previous day: 03/18 0701 - 03/19 0700 In: 5000.2 [I.V.:2968.7; NG/GT:150; IV Piggyback:1500] Out: 5850 [Urine:4730; Emesis/NG output:200; Chest Tube:920] Intake/Output this shift: Total I/O In: 1635.1 [I.V.:1263.1; Other:122; IV Piggyback:250] Out: 920 [Urine:800; Emesis/NG output:50; Chest Tube:70]  General appearance: alert and cooperative Neurologic: intact Heart: regular rate and rhythm, S1, S2 normal, no murmur, click, rub or gallop Lungs: clear to auscultation bilaterally Extremities: edema moderate edema Wound: Aquacel dressing in place. Both feet warm and pink  Lab Results:  Recent Labs  10/09/14 1645  10/10/14 0400 10/10/14 0803 10/10/14 1200  WBC 16.2*  --  13.1*  --   --   HGB 10.2*  < > 9.0* 8.2* 8.5*  HCT 29.5*  < > 25.7* 24.0* 25.0*  PLT 105*  --  77*  --   --   < > = values in this interval not displayed. BMET:  Recent Labs  10/09/14 0500  10/09/14 1651  10/10/14 0400 10/10/14 0803 10/10/14 1200  NA 141  < > 139  < > 133*  134* 133*  K 2.6*  < > 3.3*  < > 3.0* 3.2* 3.4*  CL 110  < > 100  --  100  --   --   CO2 25  --   --   --  26  --   --   GLUCOSE 75  < > 129*  < > 132* 131* 129*  BUN 11  < > 10  --  6  --   --   CREATININE 1.55*  < > 1.40*  --  1.54*  --   --   CALCIUM 8.1*  --   --   --  7.3*  --   --   < > = values in this interval not displayed.  PT/INR:  Recent Labs  10/09/14 0030  LABPROT 12.9  INR 0.96   ABG    Component Value Date/Time   PHART 7.463* 10/10/2014 1026   HCO3 21.7 10/10/2014 1026   TCO2 23 10/10/2014 1026   ACIDBASEDEF 1.0 10/10/2014 1026   O2SAT 95.0 10/10/2014 1026   CBG (last 3)   Recent Labs  10/10/14 0901 10/10/14 1005 10/10/14 1109  GLUCAP 141* 121* 127*   CLINICAL DATA: Subsequent evaluation after CABG  EXAM: PORTABLE CHEST - 1 VIEW  COMPARISON: 10/09/2014  FINDINGS: Enlarged cardiac silhouette stable. No change in support devices. Mild right lower lobe atelectasis was minimal right pleural effusion. Mild retrocardiac left lower lobe opacities with mildly improved aeration.  IMPRESSION: Anticipated postoperative appearance with improving left base aeration.  Electronically Signed  By: Esperanza Heir M.D.  On: 10/10/2014 07:39 Assessment/Plan: S/P Procedure(s) (LRB): CORONARY ARTERY BYPASS GRAFTING (CABG) (N/A) TRANSESOPHAGEAL ECHOCARDIOGRAM (TEE) (N/A)  Hemodynamically stable but still requiring significant Levophed to maintain BP. Cardiac index is good on low dose milrinone and dopamine. Will keep those going for now.  Impella is functioning well. PTT 59, ACT 153 on heparin 800 units per hr via purge line. Plan to wean tomorrow.  Continue diuresis with lasix drip and replete K+.   Keep chest tubes in today.   LOS: 3 days    [name] 10/10/2014

## 2014-10-11 ENCOUNTER — Inpatient Hospital Stay (HOSPITAL_COMMUNITY): Payer: Medicare Other

## 2014-10-11 LAB — CBC
HCT: 22.8 % — ABNORMAL LOW (ref 39.0–52.0)
Hemoglobin: 7.9 g/dL — ABNORMAL LOW (ref 13.0–17.0)
MCH: 28.4 pg (ref 26.0–34.0)
MCHC: 34.6 g/dL (ref 30.0–36.0)
MCV: 82 fL (ref 78.0–100.0)
Platelets: 61 10*3/uL — ABNORMAL LOW (ref 150–400)
RBC: 2.78 MIL/uL — ABNORMAL LOW (ref 4.22–5.81)
RDW: 13.8 % (ref 11.5–15.5)
WBC: 14.4 10*3/uL — ABNORMAL HIGH (ref 4.0–10.5)

## 2014-10-11 LAB — COMPREHENSIVE METABOLIC PANEL
ALT: 26 U/L (ref 0–53)
AST: 51 U/L — ABNORMAL HIGH (ref 0–37)
Albumin: 2.6 g/dL — ABNORMAL LOW (ref 3.5–5.2)
Alkaline Phosphatase: 42 U/L (ref 39–117)
Anion gap: 5 (ref 5–15)
BUN: 10 mg/dL (ref 6–23)
CO2: 25 mmol/L (ref 19–32)
Calcium: 7.2 mg/dL — ABNORMAL LOW (ref 8.4–10.5)
Chloride: 99 mmol/L (ref 96–112)
Creatinine, Ser: 1.39 mg/dL — ABNORMAL HIGH (ref 0.50–1.35)
GFR calc Af Amer: 59 mL/min — ABNORMAL LOW (ref >90)
GFR calc non Af Amer: 51 mL/min — ABNORMAL LOW (ref >90)
Glucose, Bld: 138 mg/dL — ABNORMAL HIGH (ref 70–99)
Potassium: 3.7 mmol/L (ref 3.5–5.1)
Sodium: 129 mmol/L — ABNORMAL LOW (ref 135–145)
Total Bilirubin: 1.9 mg/dL — ABNORMAL HIGH (ref 0.3–1.2)
Total Protein: 4.9 g/dL — ABNORMAL LOW (ref 6.0–8.3)

## 2014-10-11 LAB — GLUCOSE, CAPILLARY
GLUCOSE-CAPILLARY: 137 mg/dL — AB (ref 70–99)
GLUCOSE-CAPILLARY: 128 mg/dL — AB (ref 70–99)
GLUCOSE-CAPILLARY: 129 mg/dL — AB (ref 70–99)
Glucose-Capillary: 113 mg/dL — ABNORMAL HIGH (ref 70–99)
Glucose-Capillary: 128 mg/dL — ABNORMAL HIGH (ref 70–99)
Glucose-Capillary: 128 mg/dL — ABNORMAL HIGH (ref 70–99)
Glucose-Capillary: 129 mg/dL — ABNORMAL HIGH (ref 70–99)
Glucose-Capillary: 130 mg/dL — ABNORMAL HIGH (ref 70–99)
Glucose-Capillary: 132 mg/dL — ABNORMAL HIGH (ref 70–99)
Glucose-Capillary: 139 mg/dL — ABNORMAL HIGH (ref 70–99)
Glucose-Capillary: 144 mg/dL — ABNORMAL HIGH (ref 70–99)
Glucose-Capillary: 153 mg/dL — ABNORMAL HIGH (ref 70–99)

## 2014-10-11 LAB — POCT I-STAT 4, (NA,K, GLUC, HGB,HCT)
Glucose, Bld: 117 mg/dL — ABNORMAL HIGH (ref 70–99)
Glucose, Bld: 120 mg/dL — ABNORMAL HIGH (ref 70–99)
HCT: 25 % — ABNORMAL LOW (ref 39.0–52.0)
HCT: 26 % — ABNORMAL LOW (ref 39.0–52.0)
HEMOGLOBIN: 8.5 g/dL — AB (ref 13.0–17.0)
HEMOGLOBIN: 8.8 g/dL — AB (ref 13.0–17.0)
Potassium: 4 mmol/L (ref 3.5–5.1)
Potassium: 4.2 mmol/L (ref 3.5–5.1)
SODIUM: 129 mmol/L — AB (ref 135–145)
Sodium: 129 mmol/L — ABNORMAL LOW (ref 135–145)

## 2014-10-11 LAB — POCT I-STAT 3, ART BLOOD GAS (G3+)
Acid-base deficit: 2 mmol/L (ref 0.0–2.0)
BICARBONATE: 21.4 meq/L (ref 20.0–24.0)
O2 Saturation: 93 %
PO2 ART: 62 mmHg — AB (ref 80.0–100.0)
TCO2: 22 mmol/L (ref 0–100)
pCO2 arterial: 31 mmHg — ABNORMAL LOW (ref 35.0–45.0)
pH, Arterial: 7.447 (ref 7.350–7.450)

## 2014-10-11 LAB — APTT
aPTT: 85 seconds — ABNORMAL HIGH (ref 24–37)
aPTT: 57 seconds — ABNORMAL HIGH (ref 24–37)

## 2014-10-11 MED ORDER — POTASSIUM CHLORIDE 10 MEQ/50ML IV SOLN
INTRAVENOUS | Status: AC
  Administered 2014-10-11: 10 meq
  Filled 2014-10-11: qty 50

## 2014-10-11 MED ORDER — MECLIZINE HCL 25 MG PO TABS
25.0000 mg | ORAL_TABLET | Freq: Once | ORAL | Status: AC
  Administered 2014-10-11: 25 mg via ORAL
  Filled 2014-10-11: qty 1

## 2014-10-11 MED ORDER — SODIUM CHLORIDE 0.9 % IV SOLN: Freq: Once | INTRAVENOUS | Status: DC

## 2014-10-11 MED ORDER — POTASSIUM CHLORIDE 10 MEQ/50ML IV SOLN
10.0000 meq | INTRAVENOUS | Status: DC | PRN
  Administered 2014-10-11 (×2): 10 meq via INTRAVENOUS
  Filled 2014-10-11 (×2): qty 50

## 2014-10-11 MED ORDER — INSULIN DETEMIR 100 UNIT/ML ~~LOC~~ SOLN
15.0000 [IU] | Freq: Two times a day (BID) | SUBCUTANEOUS | Status: DC
Start: 2014-10-11 — End: 2014-10-13
  Administered 2014-10-11 – 2014-10-12 (×4): 15 [IU] via SUBCUTANEOUS
  Filled 2014-10-11 (×7): qty 0.15

## 2014-10-11 MED ORDER — METOCLOPRAMIDE HCL 5 MG/ML IJ SOLN
10.0000 mg | Freq: Four times a day (QID) | INTRAMUSCULAR | Status: AC
  Administered 2014-10-11 – 2014-10-12 (×8): 10 mg via INTRAVENOUS
  Filled 2014-10-11 (×8): qty 2

## 2014-10-11 MED ORDER — INSULIN ASPART 100 UNIT/ML ~~LOC~~ SOLN
0.0000 [IU] | SUBCUTANEOUS | Status: DC
  Administered 2014-10-12: 2 [IU] via SUBCUTANEOUS

## 2014-10-11 MED ORDER — POTASSIUM CHLORIDE CRYS ER 20 MEQ PO TBCR
40.0000 meq | EXTENDED_RELEASE_TABLET | Freq: Once | ORAL | Status: AC
  Administered 2014-10-11: 40 meq via ORAL
  Filled 2014-10-11: qty 2

## 2014-10-11 NOTE — Progress Notes (Signed)
3 Days Post-Op Procedure(s) (LRB): CORONARY ARTERY BYPASS GRAFTING (CABG) (N/A) TRANSESOPHAGEAL ECHOCARDIOGRAM (TEE) (N/A) Subjective: No complaints. Wants to get walking.  Objective: Vital signs in last 24 hours: Temp:  [98.2 F (36.8 C)-100.6 F (38.1 C)] 98.6 F (37 C) (03/20 1100) Pulse Rate:  [77-92] 80 (03/20 1100) Cardiac Rhythm:  [-] Normal sinus rhythm (03/20 0800) Resp:  [12-29] 25 (03/20 1100) BP: (74-111)/(54-74) 93/70 mmHg (03/20 1100) SpO2:  [92 %-100 %] 98 % (03/20 1100) Arterial Line BP: (72-124)/(52-70) 109/61 mmHg (03/20 1100) FiO2 (%):  [40 %] 40 % (03/19 1300) Weight:  [81.3 kg (179 lb 3.7 oz)] 81.3 kg (179 lb 3.7 oz) (03/20 0445)  Hemodynamic parameters for last 24 hours: PAP: (22-46)/(11-27) 36/11 mmHg CVP:  [7 mmHg-22 mmHg] 9 mmHg CO:  [4.8 L/min-5.8 L/min] 4.9 L/min CI:  [2.6 L/min/m2-3.2 L/min/m2] 2.7 L/min/m2  On milrinone 0.125, dop 5, levophed 10 and Impella  Intake/Output from previous day: 03/19 0701 - 03/20 0700 In: 3848.4 [P.O.:90; I.V.:2676.8; IV Piggyback:650] Out: 2440 [Urine:1840; Chest Tube:600] Intake/Output this shift: Total I/O In: 395.5 [I.V.:277.9; Other:67.6; IV Piggyback:50] Out: 455 [Urine:385; Chest Tube:70]  General appearance: alert and cooperative Neurologic: intact Heart: regular rate and rhythm, S1, S2 normal, no murmur, click, rub or gallop Lungs: clear to auscultation bilaterally Abdomen: soft, non-tender; bowel sounds normal; no masses,  no organomegaly Extremities: edema mild. Feet are warm and well-perfused Wound: Aquacel in place.  Lab Results:  Recent Labs  10/10/14 0400  10/10/14 2312 10/11/14 0400  WBC 13.1*  --   --  14.4*  HGB 9.0*  < > 13.3 7.9*  HCT 25.7*  < > 39.0 22.8*  PLT 77*  --   --  61*  < > = values in this interval not displayed. BMET:  Recent Labs  10/10/14 0400  10/10/14 2312 10/11/14 0400  NA 133*  < > 134* 129*  K 3.0*  < > 4.0 3.7  CL 100  --   --  99  CO2 26  --   --  25   GLUCOSE 132*  < > 140* 138*  BUN 6  --   --  10  CREATININE 1.54*  --   --  1.39*  CALCIUM 7.3*  --   --  7.2*  < > = values in this interval not displayed.  PT/INR:  Recent Labs  10/09/14 0030  LABPROT 12.9  INR 0.96   ABG    Component Value Date/Time   PHART 7.447 10/11/2014 0400   HCO3 21.4 10/11/2014 0400   TCO2 22 10/11/2014 0400   ACIDBASEDEF 2.0 10/11/2014 0400   O2SAT 93.0 10/11/2014 0400   CBG (last 3)   Recent Labs  10/11/14 0656 10/11/14 0751 10/11/14 0858  GLUCAP 130* 129* 129*   CLINICAL DATA: Status post CABG surgery on 10/08/2014.  EXAM: PORTABLE CHEST - 1 VIEW  COMPARISON: 09/1914.  FINDINGS: Since the previous day's study, the endotracheal tube and nasogastric tube have been removed. The right internal jugular Swan-Ganz catheter is stable with its tip of the right pulmonary artery. The mediastinal tube and left chest tube are unchanged.  Cardiopericardial silhouette is mildly enlarged but stable. There is no mediastinal widening.  Left greater than right lung base opacity is similar consistent with atelectasis, probably with a small amount of right pleural fluid. There is no pulmonary edema.  No pneumothorax.  IMPRESSION: 1. Status post removal of the endotracheal tube and nasogastric tube. Remaining support apparatus is stable in well positioned.  2. Mild persistent lung base atelectasis. No pulmonary edema. No pneumothorax or mediastinal widening. No evidence of an operative complication.   Electronically Signed  By: Amie Portland M.D.  On: 10/11/2014 08:01   Assessment/Plan: S/P Procedure(s) (LRB): CORONARY ARTERY BYPASS GRAFTING (CABG) (N/A) TRANSESOPHAGEAL ECHOCARDIOGRAM (TEE) (N/A)  He is hemodynamically stable with good cardiac index and normal PA pressures. Will DC milrinone and continue dopamine. Wean levophed as BP allows.  Start Impella wean so that it can be removed in the am.  Expected acute blood  loss anemia. Will transfuse 1 unit prbc's today which should help BP.  Volume excess: continue diuresis. Will increase lasix to 5. Replete K+.  Remove MT's today. Keep left pleural tube in today.   LOS: 4 days    [name] 10/11/2014

## 2014-10-12 ENCOUNTER — Inpatient Hospital Stay (HOSPITAL_COMMUNITY): Payer: Medicare Other

## 2014-10-12 ENCOUNTER — Encounter (HOSPITAL_COMMUNITY): Payer: Self-pay | Admitting: Cardiothoracic Surgery

## 2014-10-12 DIAGNOSIS — I2510 Atherosclerotic heart disease of native coronary artery without angina pectoris: Secondary | ICD-10-CM

## 2014-10-12 LAB — COMPREHENSIVE METABOLIC PANEL
ALT: 26 U/L (ref 0–53)
AST: 35 U/L (ref 0–37)
Albumin: 2.6 g/dL — ABNORMAL LOW (ref 3.5–5.2)
Alkaline Phosphatase: 48 U/L (ref 39–117)
Anion gap: 6 (ref 5–15)
BUN: 14 mg/dL (ref 6–23)
CO2: 25 mmol/L (ref 19–32)
Calcium: 7.1 mg/dL — ABNORMAL LOW (ref 8.4–10.5)
Chloride: 96 mmol/L (ref 96–112)
Creatinine, Ser: 1.24 mg/dL (ref 0.50–1.35)
GFR calc Af Amer: 67 mL/min — ABNORMAL LOW (ref >90)
GFR calc non Af Amer: 58 mL/min — ABNORMAL LOW (ref >90)
Glucose, Bld: 117 mg/dL — ABNORMAL HIGH (ref 70–99)
Potassium: 4 mmol/L (ref 3.5–5.1)
Sodium: 127 mmol/L — ABNORMAL LOW (ref 135–145)
Total Bilirubin: 1.2 mg/dL (ref 0.3–1.2)
Total Protein: 4.8 g/dL — ABNORMAL LOW (ref 6.0–8.3)

## 2014-10-12 LAB — CBC
HCT: 24.3 % — ABNORMAL LOW (ref 39.0–52.0)
HCT: 24.4 % — ABNORMAL LOW (ref 39.0–52.0)
Hemoglobin: 8.8 g/dL — ABNORMAL LOW (ref 13.0–17.0)
Hemoglobin: 8.5 g/dL — ABNORMAL LOW (ref 13.0–17.0)
MCH: 29.3 pg (ref 26.0–34.0)
MCH: 28.6 pg (ref 26.0–34.0)
MCHC: 36.2 g/dL — ABNORMAL HIGH (ref 30.0–36.0)
MCHC: 34.8 g/dL (ref 30.0–36.0)
MCV: 81 fL (ref 78.0–100.0)
MCV: 82.2 fL (ref 78.0–100.0)
Platelets: 57 10*3/uL — ABNORMAL LOW (ref 150–400)
Platelets: 60 10*3/uL — ABNORMAL LOW (ref 150–400)
RBC: 3 MIL/uL — ABNORMAL LOW (ref 4.22–5.81)
RBC: 2.97 MIL/uL — ABNORMAL LOW (ref 4.22–5.81)
RDW: 14.4 % (ref 11.5–15.5)
RDW: 14.6 % (ref 11.5–15.5)
WBC: 14 10*3/uL — ABNORMAL HIGH (ref 4.0–10.5)
WBC: 13.9 10*3/uL — ABNORMAL HIGH (ref 4.0–10.5)

## 2014-10-12 LAB — POCT I-STAT 4, (NA,K, GLUC, HGB,HCT)
Glucose, Bld: 114 mg/dL — ABNORMAL HIGH (ref 70–99)
HCT: 25 % — ABNORMAL LOW (ref 39.0–52.0)
Hemoglobin: 8.5 g/dL — ABNORMAL LOW (ref 13.0–17.0)
POTASSIUM: 4 mmol/L (ref 3.5–5.1)
Sodium: 129 mmol/L — ABNORMAL LOW (ref 135–145)

## 2014-10-12 LAB — POCT I-STAT 3, ART BLOOD GAS (G3+)
ACID-BASE DEFICIT: 1 mmol/L (ref 0.0–2.0)
BICARBONATE: 23.1 meq/L (ref 20.0–24.0)
Bicarbonate: 24 mEq/L (ref 20.0–24.0)
O2 Saturation: 93 %
O2 Saturation: 93 %
PH ART: 7.469 — AB (ref 7.350–7.450)
PH ART: 7.45 (ref 7.350–7.450)
PO2 ART: 61 mmHg — AB (ref 80.0–100.0)
Patient temperature: 98
TCO2: 25 mmol/L (ref 0–100)
TCO2: 24 mmol/L (ref 0–100)
pCO2 arterial: 32.9 mmHg — ABNORMAL LOW (ref 35.0–45.0)
pCO2 arterial: 33.2 mmHg — ABNORMAL LOW (ref 35.0–45.0)
pO2, Arterial: 61 mmHg — ABNORMAL LOW (ref 80.0–100.0)

## 2014-10-12 LAB — GLUCOSE, CAPILLARY
GLUCOSE-CAPILLARY: 120 mg/dL — AB (ref 70–99)
GLUCOSE-CAPILLARY: 122 mg/dL — AB (ref 70–99)
GLUCOSE-CAPILLARY: 114 mg/dL — AB (ref 70–99)
GLUCOSE-CAPILLARY: 122 mg/dL — AB (ref 70–99)
GLUCOSE-CAPILLARY: 110 mg/dL — AB (ref 70–99)
GLUCOSE-CAPILLARY: 91 mg/dL (ref 70–99)
Glucose-Capillary: 116 mg/dL — ABNORMAL HIGH (ref 70–99)
Glucose-Capillary: 116 mg/dL — ABNORMAL HIGH (ref 70–99)
Glucose-Capillary: 118 mg/dL — ABNORMAL HIGH (ref 70–99)
Glucose-Capillary: 120 mg/dL — ABNORMAL HIGH (ref 70–99)
Glucose-Capillary: 123 mg/dL — ABNORMAL HIGH (ref 70–99)
Glucose-Capillary: 129 mg/dL — ABNORMAL HIGH (ref 70–99)
Glucose-Capillary: 102 mg/dL — ABNORMAL HIGH (ref 70–99)

## 2014-10-12 LAB — PROTIME-INR
INR: 1.08 (ref 0.00–1.49)
Prothrombin Time: 14.1 seconds (ref 11.6–15.2)

## 2014-10-12 LAB — POCT ACTIVATED CLOTTING TIME: ACTIVATED CLOTTING TIME: 147 s

## 2014-10-12 LAB — APTT
aPTT: 54 seconds — ABNORMAL HIGH (ref 24–37)
aPTT: 36 seconds (ref 24–37)

## 2014-10-12 MED ORDER — BOOST / RESOURCE BREEZE PO LIQD
1.0000 | Freq: Once | ORAL | Status: AC
  Administered 2014-10-12: 1 via ORAL

## 2014-10-12 MED ORDER — ALBUMIN HUMAN 5 % IV SOLN
12.5000 g | Freq: Once | INTRAVENOUS | Status: AC
Start: 2014-10-12 — End: 2014-10-12
  Administered 2014-10-12: 12.5 g via INTRAVENOUS

## 2014-10-12 MED ORDER — SODIUM CHLORIDE 0.9 % IJ SOLN: 10.0000 mL | INTRAMUSCULAR | Status: DC | PRN

## 2014-10-12 MED ORDER — AMIODARONE HCL 200 MG PO TABS
200.0000 mg | ORAL_TABLET | Freq: Two times a day (BID) | ORAL | Status: DC
  Administered 2014-10-12 – 2014-10-16 (×9): 200 mg via ORAL
  Filled 2014-10-12 (×10): qty 1

## 2014-10-12 MED ORDER — ALBUMIN HUMAN 5 % IV SOLN
INTRAVENOUS | Status: AC
  Filled 2014-10-12: qty 250

## 2014-10-12 MED ORDER — SODIUM CHLORIDE 0.9 % IJ SOLN: 10.0000 mL | Freq: Two times a day (BID) | INTRAMUSCULAR | Status: DC

## 2014-10-12 NOTE — Progress Notes (Signed)
NUTRITION FOLLOW-UP  INTERVENTION: Immunologist daily, provides 250 kcal, 9 grams of protein, 197 ml of free water  NUTRITION DIAGNOSIS: Inadequate oral intake related to poor appetite as evidenced by <50% PO intake.  Goal: Pt to meet >/= 90% of their estimated nutrition needs; ongoing  Monitor:  PO intake, weight trends, labs, I/O's  ASSESSMENT: Pt admitted with chest pain and had cath, pt placed on pump, which has now been removed and had CABG 3/17.   Pt extubated 3/19, with no apparent complications. Placed on Clear Liquids. 2/20- Placed on CHO Mod Pt had Impella removed this morning. Site looks good with no hematoma. Unable to talk to pt d/t procedure. Talked with RN about PO intake. Per RN, pt has been feeling nauseous, with poor PO intake since extubation (3/19). RN stated that family was going to try to bring in food items for him today. Suggested providing [employer], and RN agrees this would be beneficial for pt.   Labs and medications reviewed.  Low Na  Height: Ht Readings from Last 1 Encounters:  10/07/14 5\' 6"  (1.676 m)    Weight: Wt Readings from Last 1 Encounters:  10/12/14 179 lb 10.8 oz (81.5 kg)  Admission weight: 163 lb (73.9 kg)   BMI:  Body mass index is 29.01 kg/(m^2). Overweight  Estimated Nutritional Needs: Kcal: 1813 Protein: 100-115 grams Fluid: > 1.8 L/day  Skin: incisions  Diet Order: Diet Carb Modified  EDUCATION NEEDS: -No education needs identified at this time   Intake/Output Summary (Last 24 hours) at 10/12/14 0842 Last data filed at 10/12/14 0800  Gross per 24 hour  Intake 2727.5 ml  Output   2200 ml  Net  527.5 ml    Last BM: 3/17   Labs:   Recent Labs Lab 10/09/14 0500  10/09/14 1645  10/10/14 0400  10/11/14 0400  10/11/14 2348 10/12/14 0401 10/12/14 0754  NA 141  < >  --   < > 133*  < > 129*  < > 129* 127* 129*  K 2.6*  < >  --   < > 3.0*  < > 3.7  < > 4.0 4.0 4.0  CL 110  < >  --   < > 100   --  99  --   --  96  --   CO2 25  --   --   --  26  --  25  --   --  25  --   BUN 11  < >  --   < > 6  --  10  --   --  14  --   CREATININE 1.55*  < > 1.56*  < > 1.54*  --  1.39*  --   --  1.24  --   CALCIUM 8.1*  --   --   --  7.3*  --  7.2*  --   --  7.1*  --   MG 2.7*  --  2.0  --   --   --   --   --   --   --   --   GLUCOSE 75  < >  --   < > 132*  < > 138*  < > 120* 117* 114*  < > = values in this interval not displayed.  CBG (last 3)   Recent Labs  10/11/14 2346 10/12/14 0403 10/12/14 0753  GLUCAP 123* 110* 102*    Scheduled Meds: . sodium chloride  Intravenous Once  . acetaminophen  1,000 mg Oral 4 times per day   Or  . acetaminophen (TYLENOL) oral liquid 160 mg/5 mL  1,000 mg Per Tube 4 times per day  . antiseptic oral rinse  7 mL Mouth Rinse BID  . aspirin EC  325 mg Oral Daily   Or  . aspirin  324 mg Per Tube Daily  . atorvastatin  80 mg Oral q1800  . bisacodyl  10 mg Oral Daily   Or  . bisacodyl  10 mg Rectal Daily  . desmopressin (DDAVP) IV  20 mcg Intravenous Once  . docusate sodium  200 mg Oral Daily  . impella catheter heparin 50 unit/mL  50,000 Units Intracatheter Q24H  . insulin aspart  0-24 Units Subcutaneous 6 times per day  . insulin detemir  15 Units Subcutaneous BID  . insulin regular  0-10 Units Intravenous TID WC  . metoCLOPramide (REGLAN) injection  10 mg Intravenous 4 times per day  . pantoprazole  40 mg Oral Daily  . pneumococcal 23 valent vaccine  0.5 mL Intramuscular Tomorrow-1000  . sodium chloride  10-40 mL Intracatheter Q12H  . sodium chloride  3 mL Intravenous Q12H    Continuous Infusions: . sodium chloride 10 mL/hr at 10/12/14 0700  . sodium chloride Stopped (10/11/14 1800)  . sodium chloride    . amiodarone 30 mg/hr (10/12/14 0800)  . DOPamine 4.973 mcg/kg/min (10/12/14 0700)  . heparin    . insulin (NOVOLIN-R) infusion Stopped (10/11/14 1700)  . lactated ringers 10 mL/hr at 10/12/14 0700  . lactated ringers Stopped (10/10/14  1200)  . nitroGLYCERIN Stopped (10/09/14 0115)  . norepinephrine (LEVOPHED) Adult infusion Stopped (10/12/14 0700)   Cristela Felt, MS Dietetic Intern Pager: [phone]

## 2014-10-12 NOTE — Progress Notes (Signed)
      [address]       [place] [address]             [phone]      Sitting up in bed eating dinner  No complaints  BP 114/62 mmHg  Pulse 73  Temp(Src) 98.5 F (36.9 C) (Oral)  Resp 17  Ht 5\' 6"  (1.676 m)  Wt 179 lb 0.2 oz (81.2 kg)  BMI 28.91 kg/m2  SpO2 98% Impella removed, still on dopamine   Intake/Output Summary (Last 24 hours) at 10/12/14 1830 Last data filed at 10/12/14 1800  Gross per 24 hour  Intake   1865 ml  Output   1890 ml  Net    -25 ml   Hct 24, PLT 60K  Steven C. Dorris Fetch, MD [facility] [phone]

## 2014-10-12 NOTE — Progress Notes (Signed)
UR Completed.  [phone]  

## 2014-10-12 NOTE — Progress Notes (Signed)
Peripherally Inserted Central Catheter/Midline Placement  The IV Nurse has discussed with the patient and/or persons authorized to consent for the patient, the purpose of this procedure and the potential benefits and risks involved with this procedure.  The benefits include less needle sticks, lab draws from the catheter and patient may be discharged home with the catheter.  Risks include, but not limited to, infection, bleeding, blood clot (thrombus formation), and puncture of an artery; nerve damage and irregular heat beat.  Alternatives to this procedure were also discussed.  PICC/Midline Placement Documentation        Anthony Vasquez 10/12/2014, 10:43 AM

## 2014-10-12 NOTE — Progress Notes (Signed)
4 Days Post-Op Procedure(s) (LRB): CORONARY ARTERY BYPASS GRAFTING (CABG) (N/A) TRANSESOPHAGEAL ECHOCARDIOGRAM (TEE) (N/A) Subjective: impella removed- hemodynamics stable- + doppler pulse in R foot  Objective: Vital signs in last 24 hours: Temp:  [97.3 F (36.3 C)-99 F (37.2 C)] 97.5 F (36.4 C) (03/21 0830) Pulse Rate:  [61-82] 69 (03/21 0830) Cardiac Rhythm:  [-] Normal sinus rhythm (03/21 0800) Resp:  [12-28] 17 (03/21 0830) BP: (76-113)/(58-76) 100/61 mmHg (03/21 0600) SpO2:  [90 %-100 %] 94 % (03/21 0830) Arterial Line BP: (81-126)/(48-73) 94/48 mmHg (03/21 0830) Weight:  [179 lb 10.8 oz (81.5 kg)] 179 lb 10.8 oz (81.5 kg) (03/21 0600)  Hemodynamic parameters for last 24 hours: PAP: (33-48)/(9-24) 36/15 mmHg CVP:  [7 mmHg-22 mmHg] 9 mmHg CO:  [3.4 L/min-5.3 L/min] 3.4 L/min CI:  [1.8 L/min/m2-2.9 L/min/m2] 1.8 L/min/m2  Intake/Output from previous day: 03/20 0701 - 03/21 0700 In: 2755.1 [P.O.:450; I.V.:1497.4; Blood:380; IV Piggyback:50] Out: 2195 [Urine:1815; Chest Tube:380] Intake/Output this shift: Total I/O In: 63.3 [I.V.:48.7; Other:14.6] Out: 125 [Urine:125]  2+ edema Neuro intact NSR  Lab Results:  Recent Labs  10/11/14 0400  10/12/14 0401 10/12/14 0754  WBC 14.4*  --  14.0*  --   HGB 7.9*  < > 8.8* 8.5*  HCT 22.8*  < > 24.3* 25.0*  PLT 61*  --  57*  --   < > = values in this interval not displayed. BMET:  Recent Labs  10/11/14 0400  10/12/14 0401 10/12/14 0754  NA 129*  < > 127* 129*  K 3.7  < > 4.0 4.0  CL 99  --  96  --   CO2 25  --  25  --   GLUCOSE 138*  < > 117* 114*  BUN 10  --  14  --   CREATININE 1.39*  --  1.24  --   CALCIUM 7.2*  --  7.1*  --   < > = values in this interval not displayed.  PT/INR:  Recent Labs  10/12/14 0401  LABPROT 14.1  INR 1.08   ABG    Component Value Date/Time   PHART 7.469* 10/12/2014 0406   HCO3 24.0 10/12/2014 0406   TCO2 25 10/12/2014 0406   ACIDBASEDEF 2.0 10/11/2014 0400   O2SAT 93.0  10/12/2014 0406   CBG (last 3)   Recent Labs  10/11/14 2346 10/12/14 0403 10/12/14 0753  GLUCAP 123* 110* 102*    Assessment/Plan: S/P Procedure(s) (LRB): CORONARY ARTERY BYPASS GRAFTING (CABG) (N/A) TRANSESOPHAGEAL ECHOCARDIOGRAM (TEE) (N/A) Mobilize Diuresis d/c tubes/lines Dangle after 6hrs bed restt  LOS: 5 days    Anthony Vasquez 10/12/2014

## 2014-10-12 NOTE — Progress Notes (Signed)
Impella removed by Dr. Donata Clay.  I took over holding pressure after 15 minutes and held an additional 20 minutes.  Site looks good with no hematoma.  Moderate area of bruising around site.  Vital stable throughout hold.  Pedal pulses present with doppler.  Pressure dressing applied to site and post removal instructions given.  Will keep on bedrest for 6 hrs.

## 2014-10-13 ENCOUNTER — Inpatient Hospital Stay (HOSPITAL_COMMUNITY): Payer: Medicare Other

## 2014-10-13 LAB — TYPE AND SCREEN
ABO/RH(D): A POS
Antibody Screen: NEGATIVE
Unit division: 0
Unit division: 0
Unit division: 0
Unit division: 0
Unit division: 0
Unit division: 0
Unit division: 0

## 2014-10-13 LAB — COMPREHENSIVE METABOLIC PANEL
ALT: 29 U/L (ref 0–53)
AST: 30 U/L (ref 0–37)
Albumin: 2.6 g/dL — ABNORMAL LOW (ref 3.5–5.2)
Alkaline Phosphatase: 59 U/L (ref 39–117)
Anion gap: 5 (ref 5–15)
BUN: 22 mg/dL (ref 6–23)
CO2: 28 mmol/L (ref 19–32)
Calcium: 7.3 mg/dL — ABNORMAL LOW (ref 8.4–10.5)
Chloride: 96 mmol/L (ref 96–112)
Creatinine, Ser: 1.21 mg/dL (ref 0.50–1.35)
GFR calc Af Amer: 69 mL/min — ABNORMAL LOW (ref >90)
GFR calc non Af Amer: 60 mL/min — ABNORMAL LOW (ref >90)
Glucose, Bld: 100 mg/dL — ABNORMAL HIGH (ref 70–99)
Potassium: 3.6 mmol/L (ref 3.5–5.1)
Sodium: 129 mmol/L — ABNORMAL LOW (ref 135–145)
Total Bilirubin: 1.1 mg/dL (ref 0.3–1.2)
Total Protein: 5.2 g/dL — ABNORMAL LOW (ref 6.0–8.3)

## 2014-10-13 LAB — CBC
HCT: 23.7 % — ABNORMAL LOW (ref 39.0–52.0)
Hemoglobin: 8.3 g/dL — ABNORMAL LOW (ref 13.0–17.0)
MCH: 28.7 pg (ref 26.0–34.0)
MCHC: 35 g/dL (ref 30.0–36.0)
MCV: 82 fL (ref 78.0–100.0)
Platelets: 98 10*3/uL — ABNORMAL LOW (ref 150–400)
RBC: 2.89 MIL/uL — ABNORMAL LOW (ref 4.22–5.81)
RDW: 14.5 % (ref 11.5–15.5)
WBC: 13.1 10*3/uL — ABNORMAL HIGH (ref 4.0–10.5)

## 2014-10-13 LAB — GLUCOSE, CAPILLARY
GLUCOSE-CAPILLARY: 96 mg/dL (ref 70–99)
GLUCOSE-CAPILLARY: 73 mg/dL (ref 70–99)
GLUCOSE-CAPILLARY: 74 mg/dL (ref 70–99)
Glucose-Capillary: 62 mg/dL — ABNORMAL LOW (ref 70–99)
Glucose-Capillary: 66 mg/dL — ABNORMAL LOW (ref 70–99)
Glucose-Capillary: 80 mg/dL (ref 70–99)
Glucose-Capillary: 103 mg/dL — ABNORMAL HIGH (ref 70–99)
Glucose-Capillary: 108 mg/dL — ABNORMAL HIGH (ref 70–99)

## 2014-10-13 MED ORDER — FUROSEMIDE 10 MG/ML IJ SOLN: 20.0000 mg | Freq: Two times a day (BID) | INTRAMUSCULAR | Status: DC

## 2014-10-13 MED ORDER — POTASSIUM CHLORIDE CRYS ER 10 MEQ PO TBCR
10.0000 meq | EXTENDED_RELEASE_TABLET | Freq: Every day | ORAL | Status: DC
  Administered 2014-10-13 – 2014-10-14 (×2): 10 meq via ORAL
  Filled 2014-10-13 (×3): qty 1

## 2014-10-13 MED ORDER — MAGNESIUM HYDROXIDE 400 MG/5ML PO SUSP: 30.0000 mL | Freq: Every day | ORAL | Status: DC | PRN

## 2014-10-13 MED ORDER — POTASSIUM CHLORIDE 10 MEQ/50ML IV SOLN
10.0000 meq | INTRAVENOUS | Status: DC
  Administered 2014-10-13 (×3): 10 meq via INTRAVENOUS
  Filled 2014-10-13 (×2): qty 50

## 2014-10-13 MED ORDER — INSULIN ASPART 100 UNIT/ML ~~LOC~~ SOLN: 0.0000 [IU] | Freq: Three times a day (TID) | SUBCUTANEOUS | Status: DC

## 2014-10-13 MED ORDER — SODIUM CHLORIDE 0.9 % IV SOLN: 250.0000 mL | INTRAVENOUS | Status: DC | PRN

## 2014-10-13 MED ORDER — SODIUM CHLORIDE 0.9 % IJ SOLN
3.0000 mL | Freq: Two times a day (BID) | INTRAMUSCULAR | Status: DC
  Administered 2014-10-15 – 2014-10-16 (×2): 3 mL via INTRAVENOUS

## 2014-10-13 MED ORDER — POTASSIUM CHLORIDE 10 MEQ/50ML IV SOLN
10.0000 meq | INTRAVENOUS | Status: AC
  Administered 2014-10-13 (×2): 10 meq via INTRAVENOUS
  Filled 2014-10-13 (×3): qty 50

## 2014-10-13 MED ORDER — PRAVASTATIN SODIUM 80 MG PO TABS
80.0000 mg | ORAL_TABLET | Freq: Every day | ORAL | Status: DC
  Administered 2014-10-13 – 2014-10-15 (×3): 80 mg via ORAL
  Filled 2014-10-13 (×5): qty 1

## 2014-10-13 MED ORDER — FUROSEMIDE 20 MG PO TABS
20.0000 mg | ORAL_TABLET | Freq: Once | ORAL | Status: AC
  Administered 2014-10-13: 20 mg via ORAL
  Filled 2014-10-13: qty 1

## 2014-10-13 MED ORDER — SODIUM CHLORIDE 0.9 % IJ SOLN
3.0000 mL | INTRAMUSCULAR | Status: DC | PRN
Start: 2014-10-13 — End: 2014-10-16

## 2014-10-13 MED ORDER — MOVING RIGHT ALONG BOOK
Freq: Once | Status: AC
  Administered 2014-10-13: 18:00:00
  Filled 2014-10-13: qty 1

## 2014-10-13 MED ORDER — METOPROLOL TARTRATE 12.5 MG HALF TABLET
12.5000 mg | ORAL_TABLET | Freq: Two times a day (BID) | ORAL | Status: DC
  Administered 2014-10-14 – 2014-10-15 (×4): 12.5 mg via ORAL
  Filled 2014-10-13 (×8): qty 1

## 2014-10-13 MED ORDER — FUROSEMIDE 10 MG/ML IJ SOLN
20.0000 mg | Freq: Two times a day (BID) | INTRAMUSCULAR | Status: DC
  Administered 2014-10-13: 20 mg via INTRAVENOUS
  Filled 2014-10-13: qty 2

## 2014-10-13 MED ORDER — SODIUM CHLORIDE 0.9 % IJ SOLN
10.0000 mL | INTRAMUSCULAR | Status: DC | PRN
  Administered 2014-10-13 – 2014-10-15 (×5): 10 mL
  Filled 2014-10-13 (×5): qty 40

## 2014-10-13 NOTE — Progress Notes (Signed)
Hypoglycemic Event  CBG: 66   Treatment: 15 GM carbohydrate snack  Symptoms: Pale and Hungry  Follow-up CBG: Time:1310     CBG Result:80  Possible Reasons for Event: Change in activity  Comments/MD notified:PA made aware. Will continue to monitor.     Christell Constant Alene Mires  Remember to initiate Hypoglycemia Order Set & complete

## 2014-10-13 NOTE — Progress Notes (Signed)
5 Days Post-Op Procedure(s) (LRB): CORONARY ARTERY BYPASS GRAFTING (CABG) (N/A) TRANSESOPHAGEAL ECHOCARDIOGRAM (TEE) (N/A) Subjective: emerg CABG for MI, shock, Impella tx to stepdown today Objective: Vital signs in last 24 hours: Temp:  [97.5 F (36.4 C)-98.5 F (36.9 C)] 98.2 F (36.8 C) (03/22 0700) Pulse Rate:  [66-134] 78 (03/22 0700) Cardiac Rhythm:  [-] Normal sinus rhythm (03/22 0400) Resp:  [12-25] 21 (03/22 0700) BP: (85-114)/(49-66) 94/56 mmHg (03/22 0700) SpO2:  [85 %-100 %] 96 % (03/22 0700) Arterial Line BP: (93-129)/(44-71) 105/45 mmHg (03/22 0700) FiO2 (%):  [40 %] 40 % (03/22 0400) Weight:  [177 lb 11.2 oz (80.604 kg)-179 lb 0.2 oz (81.2 kg)] 177 lb 11.2 oz (80.604 kg) (03/22 0500)  Hemodynamic parameters for last 24 hours: PAP: (32-56)/(10-34) 33/16 mmHg CVP:  [3 mmHg-28 mmHg] 3 mmHg CO:  [3.5 L/min] 3.5 L/min CI:  [1.9 L/min/m2] 1.9 L/min/m2  Intake/Output from previous day: 03/21 0701 - 03/22 0700 In: 1151 [P.O.:360; I.V.:476.4; IV Piggyback:300] Out: 2260 [Urine:2120; Chest Tube:140] Intake/Output this shift:    Mild edema Neuro intact  Lab Results:  Recent Labs  10/12/14 1400 10/13/14 0420  WBC 13.9* 13.1*  HGB 8.5* 8.3*  HCT 24.4* 23.7*  PLT 60* 98*   BMET:  Recent Labs  10/12/14 0401 10/12/14 0754 10/13/14 0420  NA 127* 129* 129*  K 4.0 4.0 3.6  CL 96  --  96  CO2 25  --  28  GLUCOSE 117* 114* 100*  BUN 14  --  22  CREATININE 1.24  --  1.21  CALCIUM 7.1*  --  7.3*    PT/INR:  Recent Labs  10/12/14 0401  LABPROT 14.1  INR 1.08   ABG    Component Value Date/Time   PHART 7.450 10/12/2014 2133   HCO3 23.1 10/12/2014 2133   TCO2 24 10/12/2014 2133   ACIDBASEDEF 1.0 10/12/2014 2133   O2SAT 93.0 10/12/2014 2133   CBG (last 3)   Recent Labs  10/12/14 1219 10/13/14 0339 10/13/14 0419  GLUCAP 91 62* 96    Assessment/Plan: S/P Procedure(s) (LRB): CORONARY ARTERY BYPASS GRAFTING (CABG) (N/A) TRANSESOPHAGEAL  ECHOCARDIOGRAM (TEE) (N/A) Mobilize Diuresis Diabetes control Plan for transfer to step-down: see transfer orders   LOS: 6 days    Kathlee Nations Trigt III 10/13/2014

## 2014-10-13 NOTE — Progress Notes (Signed)
[facility] DAILY ICU PROGRESS NOTE                   [address]            [place] [address]          [phone]   5 Days Post-Op Procedure(s) (LRB): CORONARY ARTERY BYPASS GRAFTING (CABG) (N/A) TRANSESOPHAGEAL ECHOCARDIOGRAM (TEE) (N/A)  Total Length of Stay:  LOS: 6 days   Subjective: conts to feel well and make good progress. Mild intermit nausea  Objective: Vital signs in last 24 hours: Temp:  [97.5 F (36.4 C)-98.5 F (36.9 C)] 97.5 F (36.4 C) (03/22 0337) Pulse Rate:  [66-134] 78 (03/22 0700) Cardiac Rhythm:  [-] Normal sinus rhythm (03/22 0400) Resp:  [12-25] 21 (03/22 0700) BP: (85-114)/(49-66) 94/56 mmHg (03/22 0700) SpO2:  [85 %-100 %] 96 % (03/22 0700) Arterial Line BP: (89-129)/(44-71) 105/45 mmHg (03/22 0700) FiO2 (%):  [40 %] 40 % (03/22 0400) Weight:  [177 lb 11.2 oz (80.604 kg)-179 lb 0.2 oz (81.2 kg)] 177 lb 11.2 oz (80.604 kg) (03/22 0500)  Filed Weights   10/12/14 0600 10/12/14 1600 10/13/14 0500  Weight: 179 lb 10.8 oz (81.5 kg) 179 lb 0.2 oz (81.2 kg) 177 lb 11.2 oz (80.604 kg)    Weight change: -10.6 oz (-0.3 kg)   Hemodynamic parameters for last 24 hours: PAP: (32-56)/(10-34) 33/16 mmHg CVP:  [3 mmHg-28 mmHg] 3 mmHg CO:  [3.5 L/min] 3.5 L/min CI:  [1.9 L/min/m2] 1.9 L/min/m2  Intake/Output from previous day: 03/21 0701 - 03/22 0700 In: 1151 [P.O.:360; I.V.:476.4; IV Piggyback:300] Out: 2260 [Urine:2120; Chest Tube:140]  Intake/Output this shift:    Current Meds: Scheduled Meds: . sodium chloride   Intravenous Once  . acetaminophen  1,000 mg Oral 4 times per day   Or  . acetaminophen (TYLENOL) oral liquid 160 mg/5 mL  1,000 mg Per Tube 4 times per day  . amiodarone  200 mg Oral BID  . antiseptic oral rinse  7 mL Mouth Rinse BID  . aspirin EC  325 mg Oral Daily   Or  . aspirin  324 mg Per Tube Daily  . atorvastatin  80 mg Oral q1800  . bisacodyl  10 mg Oral Daily   Or  . bisacodyl  10 mg Rectal Daily  . docusate  sodium  200 mg Oral Daily  . furosemide  20 mg Intravenous BID  . insulin aspart  0-24 Units Subcutaneous 6 times per day  . insulin detemir  15 Units Subcutaneous BID  . pantoprazole  40 mg Oral Daily  . pneumococcal 23 valent vaccine  0.5 mL Intramuscular Tomorrow-1000  . potassium chloride  10 mEq Intravenous Q1 Hr x 3  . potassium chloride  10 mEq Intravenous Q1 Hr x 2   Continuous Infusions: . sodium chloride 10 mL/hr at 10/12/14 0700  . sodium chloride Stopped (10/11/14 1800)  . sodium chloride    . DOPamine 4 mcg/kg/min (10/12/14 2300)  . lactated ringers 10 mL/hr at 10/12/14 0700  . lactated ringers Stopped (10/10/14 1200)  . nitroGLYCERIN Stopped (10/09/14 0115)   PRN Meds:.sodium chloride, morphine injection, ondansetron (ZOFRAN) IV, oxyCODONE, potassium chloride, sodium chloride, traMADol  General appearance: alert, cooperative and no distress Heart: regular rate and rhythm Lungs: mildly dim in bases Abdomen: soft, nontender Extremities: + LE edma Wound: incis healing well  Lab Results: CBC: Recent Labs  10/12/14 1400 10/13/14 0420  WBC 13.9* 13.1*  HGB 8.5* 8.3*  HCT  24.4* 23.7*  PLT 60* 98*   BMET:  Recent Labs  10/12/14 0401 10/12/14 0754 10/13/14 0420  NA 127* 129* 129*  K 4.0 4.0 3.6  CL 96  --  96  CO2 25  --  28  GLUCOSE 117* 114* 100*  BUN 14  --  22  CREATININE 1.24  --  1.21  CALCIUM 7.1*  --  7.3*    PT/INR:  Recent Labs  10/12/14 0401  LABPROT 14.1  INR 1.08   Radiology: Dg Chest Port 1 View  10/12/2014   CLINICAL DATA:  Post CABG  EXAM: PORTABLE CHEST - 1 VIEW  COMPARISON:  10/11/2014; 10/10/2014; 10/09/2014  FINDINGS: Grossly unchanged enlarged cardiac silhouette and mediastinal contours post median sternotomy and CABG. Interval removal of a a mediastinal drain. Otherwise, stable positioning of remaining support apparatus. No pneumothorax. Pulmonary vasculature appears less distinct than present examination with cephalization of  flow, most conspicuous within the right upper lobe. Grossly unchanged bilateral infrahilar opacities favored to represent atelectasis. No new focal airspace opacities. Trace right-sided effusion is not excluded. Unchanged bones.  IMPRESSION: 1. Interval removal of mediastinal drain. Otherwise, stable positioning of remaining support apparatus. No pneumothorax. 2. Findings most suggestive of slight worsening of asymmetric pulmonary edema, right greater than left.   Electronically Signed   By: Simonne Come M.D.   On: 10/12/2014 07:15     Assessment/Plan: S/P Procedure(s) (LRB): CORONARY ARTERY BYPASS GRAFTING (CABG) (N/A) TRANSESOPHAGEAL ECHOCARDIOGRAM (TEE) (N/A)  1 steady progress 2 hemodyn stable on low dose dopamine 3 in sinus rhythm. On po amio 4 expected ABL anemia, stable- monitor closely. Thrombocytopenia is improving 5 good UOP, normal renal fxn- cont to diurese     GOLD,WAYNE E 10/13/2014 8:26 AM

## 2014-10-13 NOTE — Evaluation (Signed)
Physical Therapy Evaluation Patient Details Name: Anthony Vasquez MRN: [id number] DOB: [birth year] Today's Date: 10/13/2014   History of Present Illness  69 yo man with PMH of hypertension and dyslipidemia who presents with ~ 1 week of increased exercise induced discomfort found to have elevated troponin in the ER consistent with NSTEMI.  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  Clinical Impression  Patient presents with decreased mobility due to deficits listed in PT problem list.  He will benefit from skilled PT in the acute setting to allow return home with family assist and HHPT.  Will also benefit from cardiac rehab following HHPT.    Follow Up Recommendations Home health PT;Supervision - Intermittent    Equipment Recommendations  Other (comment) (TBA if unable to find walker from [place] member)    Recommendations for Other Services       Precautions / Restrictions Precautions Precautions: Sternal      Mobility  Bed Mobility               General bed mobility comments: NT up in chair  Transfers Overall transfer level: Needs assistance Equipment used: Rolling walker (2 wheeled) Transfers: Sit to/from Stand Sit to Stand: Min assist         General transfer comment: pt hugging pillow to avoid using UE's  Ambulation/Gait Ambulation/Gait assistance: Min assist Ambulation Distance (Feet): 150 Feet Assistive device: Rolling walker (2 wheeled) Gait Pattern/deviations: Step-through pattern;Decreased stride length     General Gait Details: mobilizing well with walker, but fatigued after ambulation  Stairs            Wheelchair Mobility    Modified Rankin (Stroke Patients Only)       Balance Overall balance assessment: Needs assistance   Sitting balance-Leahy Scale: Good     Standing balance support: No upper extremity supported Standing balance-Leahy Scale: Fair Standing balance comment: needs UE support for ambulation                              Pertinent Vitals/Pain Pain Assessment: 0-10 Pain Score: 2  Pain Location: chest incision Pain Intervention(s): Monitored during session    Home Living Family/patient expects to be discharged to:: Private residence Living Arrangements: Spouse/significant other Available Help at Discharge: Family;Available 24 hours/day Type of Home: House Home Access: Stairs to enter Entrance Stairs-Rails: Right Entrance Stairs-Number of Steps: 2 Home Layout: One level Home Equipment: Other (comment) (Thinks he can get walker from family)      Prior Function Level of Independence: Independent         Comments: works for [employer]        Extremity/Trunk Assessment               Lower Extremity Assessment: Overall WFL for tasks assessed         Communication   Communication: No difficulties  Cognition Arousal/Alertness: Awake/alert Behavior During Therapy: WFL for tasks assessed/performed Overall Cognitive Status: Within Functional Limits for tasks assessed                      General Comments      Exercises        Assessment/Plan    PT Assessment Patient needs continued PT services  PT Diagnosis Generalized weakness   PT Problem List Decreased strength;Decreased activity tolerance;Decreased mobility;Cardiopulmonary status limiting activity;Decreased knowledge of use of DME;Decreased knowledge of precautions  PT  Treatment Interventions DME instruction;Gait training;Stair training;Functional mobility training;Therapeutic activities;Therapeutic exercise;Patient/family education   PT Goals (Current goals can be found in the Care Plan section) Acute Rehab PT Goals Patient Stated Goal: To return to independent PT Goal Formulation: With patient Time For Goal Achievement: 10/20/14 Potential to Achieve Goals: Good    Frequency Min 3X/week   Barriers to discharge        Co-evaluation               End of Session Equipment  Utilized During Treatment: Gait belt;Oxygen Activity Tolerance: Patient limited by fatigue Patient left: in chair;with call bell/phone within reach;with nursing/sitter in room Nurse Communication: Other (comment) (low BP)         Time: 1610-9604 PT Time Calculation (min) (ACUTE ONLY): 25 min   Charges:   PT Evaluation $Initial PT Evaluation Tier I: 1 Procedure PT Treatments $Gait Training: 8-22 mins   PT G Codes:        Rashelle Ireland,CYNDI 18-Oct-2014, 4:48 PM  Sheran Lawless, PT [phone] 10/18/2014

## 2014-10-13 NOTE — Progress Notes (Signed)
Hypoglycemic Event  CBG: 62  Treatment: 4 oz of juice  Symptoms: none  Follow-up CBG: Time: 0419 CBG Result: 96  Possible Reasons for Event: medication      Kaylyn Lim, Joyice Faster  Remember to initiate Hypoglycemia Order Set & complete

## 2014-10-13 NOTE — Op Note (Signed)
NAME:  Anthony Vasquez, Anthony Vasquez NO.:  [id number]  MEDICAL RECORD NO.:  [id number]  LOCATION:  2S10C                        FACILITY:  [facility]  PHYSICIAN:  Kerin Perna, M.D.  DATE OF BIRTH:  25-Feb-1946  DATE OF PROCEDURE:  10/12/2014 DATE OF DISCHARGE:                              OPERATIVE REPORT   PREOPERATIVE DIAGNOSES:  Acute myocardial infarction with cardiogenic shock and emergency CABG x3.  POSTOPERATIVE DIAGNOSES:  Acute myocardial infarction with cardiogenic shock and emergency CABG x3.  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  ANESTHESIA:  IV conscious sedation.  SURGEON:  Kerin Perna, MD.  DESCRIPTION OF PROCEDURE:  The patient had undergone emergency coronary artery bypass grafting and had a preoperative percutaneous LVAD placed under informed consent by Dr. [place].  Three days after surgery, his hemodynamics have stabilized and he was extubated and the percutaneous LVAD was weaned down to a minimal level.  It was decided he was stable to remove the Impella temporary LVAD.  I discussed the procedure with the patient.  He agreed to proceed.  The right groin was prepped and draped as a sterile field.  The sutures that were used to stabilize and immobilize the Impella catheter were divided.  The Impella catheter was pulled back to the sheath.  When it was pulled out of the heart to the arch vessels, the speed on the pump was reduced to the lowest level.  When the Impella catheter was pulled with the sheath out of the leg, the pump was turned off.  I pulled the catheter with the sheath without difficulty.  Hand held pressure on the right groin over the femoral artery was held by myself for 15 minutes. A Doppler pulse was documented in the right foot.  The cath lab team then presented for further pressure on the catheter insertion site in the right femoral artery.  A sterile dressing was then  applied.     Kerin Perna, M.D.     PV/MEDQ  D:  10/12/2014  T:  10/13/2014  Job:  [id number]

## 2014-10-14 ENCOUNTER — Inpatient Hospital Stay (HOSPITAL_COMMUNITY): Payer: Medicare Other

## 2014-10-14 LAB — BASIC METABOLIC PANEL
Anion gap: 7 (ref 5–15)
BUN: 24 mg/dL — ABNORMAL HIGH (ref 6–23)
CO2: 27 mmol/L (ref 19–32)
Calcium: 7.5 mg/dL — ABNORMAL LOW (ref 8.4–10.5)
Chloride: 99 mmol/L (ref 96–112)
Creatinine, Ser: 1.29 mg/dL (ref 0.50–1.35)
GFR calc Af Amer: 64 mL/min — ABNORMAL LOW (ref >90)
GFR calc non Af Amer: 55 mL/min — ABNORMAL LOW (ref >90)
Glucose, Bld: 91 mg/dL (ref 70–99)
Potassium: 3.5 mmol/L (ref 3.5–5.1)
Sodium: 133 mmol/L — ABNORMAL LOW (ref 135–145)

## 2014-10-14 LAB — CBC
HCT: 22.2 % — ABNORMAL LOW (ref 39.0–52.0)
Hemoglobin: 7.6 g/dL — ABNORMAL LOW (ref 13.0–17.0)
MCH: 28.9 pg (ref 26.0–34.0)
MCHC: 34.2 g/dL (ref 30.0–36.0)
MCV: 84.4 fL (ref 78.0–100.0)
Platelets: 189 10*3/uL (ref 150–400)
RBC: 2.63 MIL/uL — ABNORMAL LOW (ref 4.22–5.81)
RDW: 14.8 % (ref 11.5–15.5)
WBC: 11.5 10*3/uL — ABNORMAL HIGH (ref 4.0–10.5)

## 2014-10-14 LAB — GLUCOSE, CAPILLARY
GLUCOSE-CAPILLARY: 106 mg/dL — AB (ref 70–99)
Glucose-Capillary: 86 mg/dL (ref 70–99)
Glucose-Capillary: 82 mg/dL (ref 70–99)
Glucose-Capillary: 84 mg/dL (ref 70–99)

## 2014-10-14 MED ORDER — FUROSEMIDE 10 MG/ML IJ SOLN
40.0000 mg | Freq: Every day | INTRAMUSCULAR | Status: DC
  Administered 2014-10-14 – 2014-10-15 (×2): 40 mg via INTRAVENOUS
  Filled 2014-10-14 (×3): qty 4

## 2014-10-14 MED ORDER — POLYSACCHARIDE IRON COMPLEX 150 MG PO CAPS
150.0000 mg | ORAL_CAPSULE | Freq: Every day | ORAL | Status: DC
  Filled 2014-10-14: qty 1

## 2014-10-14 MED ORDER — FE FUMARATE-B12-VIT C-FA-IFC PO CAPS
1.0000 | ORAL_CAPSULE | Freq: Three times a day (TID) | ORAL | Status: DC
  Administered 2014-10-14 – 2014-10-16 (×7): 1 via ORAL
  Filled 2014-10-14 (×9): qty 1

## 2014-10-14 NOTE — Progress Notes (Signed)
CARDIAC REHAB PHASE I   PRE:  Rate/Rhythm: 86 SR    BP: sitting 106/58    SaO2: 98 5L, 98 2L, 93 RA  MODE:  Ambulation: 200 ft   POST:  Rate/Rhythm: 94 SR    BP: sitting 115/57     SaO2: 87-90 RA after 50 ft, 95 2L at end of walk  Pt slowly improving. Able to stand with min assist after instructions for sternal precautions. Weaned O2 to 2L for walking and RA in room after walk. C/o weak/tired legs with distance. Used RW, assist x1 and 2L. Return to recliner. Motivated. Practicing IS, inspiring 1000 mL. Plans to walk x2 more. [id number]   Elissa Lovett Maupin CES, ACSM 10/14/2014 11:37 AM

## 2014-10-14 NOTE — Progress Notes (Addendum)
[address]       [place] [address]             [phone]          6 Days Post-Op Procedure(s) (LRB): CORONARY ARTERY BYPASS GRAFTING (CABG) (N/A) TRANSESOPHAGEAL ECHOCARDIOGRAM (TEE) (N/A)  Subjective: Feels ok except rested poorly last night.  Appetite improved today.  Breathing stable.   Objective: Vital signs in last 24 hours: Patient Vitals for the past 24 hrs:  BP Temp Temp src Pulse Resp SpO2 Weight  10/14/14 0419 (!) 104/57 mmHg 98.2 F (36.8 C) Oral 81 18 - 175 lb 0.7 oz (79.4 kg)  10/13/14 2033 105/65 mmHg 98.1 F (36.7 C) Oral 83 18 - -  10/13/14 1748 (!) 105/56 mmHg - - - 20 - -  10/13/14 1459 (!) 82/46 mmHg - - 79 - - -  10/13/14 1402 104/74 mmHg - - 87 20 - -  10/13/14 1340 (!) 100/58 mmHg - - 87 (!) 22 - -  10/13/14 1302 (!) 95/44 mmHg - - 84 (!) 22 - -  10/13/14 1254 (!) 84/47 mmHg - - 78 (!) 22 - -  10/13/14 1245 (!) 88/50 mmHg 98 F (36.7 C) Oral 78 (!) 22 100 % -  10/13/14 1200 (!) 96/56 mmHg 98 F (36.7 C) Oral 78 (!) 22 100 % -  10/13/14 1115 - - - 79 (!) 25 100 % -  10/13/14 1100 (!) 99/58 mmHg - - 79 20 100 % -  10/13/14 1045 - - - 79 (!) 21 99 % -  10/13/14 1030 - - - 80 (!) 25 100 % -  10/13/14 1015 - - - 79 (!) 21 99 % -  10/13/14 1000 109/60 mmHg - - 81 (!) 21 96 % -  10/13/14 0945 - - - 81 18 99 % -  10/13/14 0930 - - - 79 (!) 22 100 % -  10/13/14 0915 - - - 84 20 98 % -  10/13/14 0900 96/62 mmHg - - 85 20 95 % -   Current Weight  10/14/14 175 lb 0.7 oz (79.4 kg)     Intake/Output from previous day: 03/22 0701 - 03/23 0700 In: 477.9 [P.O.:240; I.V.:37.9; IV Piggyback:200] Out: 1495 [Urine:1495]  CBGs 108-86-91   PHYSICAL EXAM:  Heart: RRR Lungs: Diminished BS in bases Wound: Clean and dry Extremities: Mild LE edema    Lab Results: CBC: Recent Labs  10/13/14 0420 10/14/14 0505  WBC 13.1* 11.5*  HGB 8.3* 7.6*  HCT 23.7* 22.2*  PLT 98* 189   BMET:  Recent Labs  10/13/14 0420  10/14/14 0505  NA 129* 133*  K 3.6 3.5  CL 96 99  CO2 28 27  GLUCOSE 100* 91  BUN 22 24*  CREATININE 1.21 1.29  CALCIUM 7.3* 7.5*    PT/INR:  Recent Labs  10/12/14 0401  LABPROT 14.1  INR 1.08   CXR: FINDINGS: There remains mild elevation of the right hemidiaphragm. Small amounts of pleural fluid blunt the costophrenic angles. The pulmonary interstitial markings remain mildly increased but have improved slightly since yesterday's study with overall improved aeration of the lungs. The cardiac silhouette remains enlarged. The pulmonary vascularity is less engorged and more distinct. There are 6 intact sternal wires. The trachea is midline. The PICC line tip projects over the midportion of the SVC.  IMPRESSION: Continued interval improvement in the appearance of the chest with decreasing pulmonary interstitial edema and resolving subsegmental  atelectasis -infiltrates. Tiny bilateral pleural effusions persist. There remains mild elevation of the right hemidiaphragm.   Assessment/Plan: S/P Procedure(s) (LRB): CORONARY ARTERY BYPASS GRAFTING (CABG) (N/A) TRANSESOPHAGEAL ECHOCARDIOGRAM (TEE) (N/A)  CV- Maintaining SR, BPs low normal. Continue current meds.  Expected postop blood loss anemia- H/H down slightly.  Will start Fe and watch.  Postop thrombocytopenia- plts improved.  Deconditioning- CRPI/PT.  Endocrine- CBGs stable.  A1C=6.0. Continue SSI.  GU- Foley d/c'ed this am.    LOS: 7 days    COLLINS,GINA H 10/14/2014  Continue daily IV Lasix for fluid overload Patient will need to be discharged home on low-dose ACE inhibitor because of preoperative MI patient examined and medical record reviewed,agree with above note. Kathlee Nations Trigt III 10/14/2014

## 2014-10-15 LAB — BASIC METABOLIC PANEL
Anion gap: 7 (ref 5–15)
BUN: 24 mg/dL — ABNORMAL HIGH (ref 6–23)
CO2: 27 mmol/L (ref 19–32)
Calcium: 8.1 mg/dL — ABNORMAL LOW (ref 8.4–10.5)
Chloride: 100 mmol/L (ref 96–112)
Creatinine, Ser: 1.1 mg/dL (ref 0.50–1.35)
GFR calc Af Amer: 78 mL/min — ABNORMAL LOW (ref >90)
GFR calc non Af Amer: 67 mL/min — ABNORMAL LOW (ref >90)
Glucose, Bld: 92 mg/dL (ref 70–99)
Potassium: 3.3 mmol/L — ABNORMAL LOW (ref 3.5–5.1)
Sodium: 134 mmol/L — ABNORMAL LOW (ref 135–145)

## 2014-10-15 LAB — GLUCOSE, CAPILLARY
GLUCOSE-CAPILLARY: 87 mg/dL (ref 70–99)
GLUCOSE-CAPILLARY: 85 mg/dL (ref 70–99)
GLUCOSE-CAPILLARY: 93 mg/dL (ref 70–99)
Glucose-Capillary: 73 mg/dL (ref 70–99)
Glucose-Capillary: 93 mg/dL (ref 70–99)

## 2014-10-15 LAB — CBC
HCT: 24 % — ABNORMAL LOW (ref 39.0–52.0)
Hemoglobin: 8.1 g/dL — ABNORMAL LOW (ref 13.0–17.0)
MCH: 28.9 pg (ref 26.0–34.0)
MCHC: 33.8 g/dL (ref 30.0–36.0)
MCV: 85.7 fL (ref 78.0–100.0)
Platelets: 327 10*3/uL (ref 150–400)
RBC: 2.8 MIL/uL — ABNORMAL LOW (ref 4.22–5.81)
RDW: 14.9 % (ref 11.5–15.5)
WBC: 12.7 10*3/uL — ABNORMAL HIGH (ref 4.0–10.5)

## 2014-10-15 MED ORDER — POTASSIUM CHLORIDE CRYS ER 20 MEQ PO TBCR
20.0000 meq | EXTENDED_RELEASE_TABLET | Freq: Every day | ORAL | Status: DC
  Administered 2014-10-15 – 2014-10-16 (×2): 20 meq via ORAL
  Filled 2014-10-15 (×2): qty 1

## 2014-10-15 MED ORDER — POTASSIUM CHLORIDE CRYS ER 20 MEQ PO TBCR
40.0000 meq | EXTENDED_RELEASE_TABLET | Freq: Once | ORAL | Status: AC
  Administered 2014-10-15: 40 meq via ORAL
  Filled 2014-10-15: qty 2

## 2014-10-15 MED ORDER — POTASSIUM CHLORIDE CRYS ER 10 MEQ PO TBCR
30.0000 meq | EXTENDED_RELEASE_TABLET | Freq: Once | ORAL | Status: AC
  Administered 2014-10-15: 30 meq via ORAL
  Filled 2014-10-15: qty 1

## 2014-10-15 MED ORDER — LISINOPRIL 2.5 MG PO TABS
2.5000 mg | ORAL_TABLET | Freq: Every day | ORAL | Status: DC
  Administered 2014-10-15: 2.5 mg via ORAL
  Filled 2014-10-15 (×3): qty 1

## 2014-10-15 NOTE — Progress Notes (Signed)
Physical Therapy Treatment Patient Details Name: Anthony Vasquez MRN: [id number] DOB: 1945-11-23 Today's Date: 10/15/2014    History of Present Illness 69 yo man with PMH of hypertension and dyslipidemia who presents with ~ 1 week of increased exercise induced discomfort found to have elevated troponin in the ER consistent with NSTEMI.  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    PT Comments    Pt progressing well. Able to recall 3 precautions educated for all with handout provided. Pt educated for all transfers, gait, incentive spirometer use and HEP. Pt was able to walk 10' without Rw but felt more comfortable with continued use at this point. HR 97-107 with activity with sats dropping to 90% with activity with cues for pursed lip breathing and increased rest breaks. Will continue to follow.    Follow Up Recommendations  No PT follow up (defer to cardiac rehab)     Equipment Recommendations  Rolling walker with 5" wheels    Recommendations for Other Services       Precautions / Restrictions Precautions Precautions: Sternal    Mobility  Bed Mobility Overal bed mobility: Needs Assistance Bed Mobility: Sidelying to Sit;Sit to Sidelying   Sidelying to sit: Supervision     Sit to sidelying: Supervision General bed mobility comments: cues for sequence and precautions  Transfers Overall transfer level: Needs assistance   Transfers: Sit to/from Stand Sit to Stand: Supervision         General transfer comment: cues for hands on thighs to maintain precautions  Ambulation/Gait Ambulation/Gait assistance: Supervision Ambulation Distance (Feet): 600 Feet Assistive device: Rolling walker (2 wheeled) Gait Pattern/deviations: Step-through pattern;Decreased stride length   Gait velocity interpretation: Below normal speed for age/gender General Gait Details: cues for posture, position in RW and breathing technique   Stairs Stairs: Yes Stairs assistance: Min guard Stair Management:  Forwards;One rail Left Number of Stairs: 2 General stair comments: 1 step with rail, one step HHA no cues needed  Wheelchair Mobility    Modified Rankin (Stroke Patients Only)       Balance                                    Cognition Arousal/Alertness: Awake/alert Behavior During Therapy: WFL for tasks assessed/performed Overall Cognitive Status: Within Functional Limits for tasks assessed                      Exercises General Exercises - Lower Extremity Long Arc Quad: AROM;Both;15 reps;Seated Hip Flexion/Marching: AROM;Both;15 reps;Seated    General Comments        Pertinent Vitals/Pain Pain Assessment: No/denies pain    Home Living                      Prior Function            PT Goals (current goals can now be found in the care plan section) Progress towards PT goals: Progressing toward goals    Frequency       PT Plan Discharge plan needs to be updated    Co-evaluation             End of Session   Activity Tolerance: Patient tolerated treatment well Patient left: in chair;with call bell/phone within reach     Time: 0805-0831 PT Time Calculation (min) (ACUTE ONLY): 26 min  Charges:  $Gait Training: 8-22 mins $Therapeutic Exercise: 8-22 mins  G CodesDelorse Lek 11/14/14, 10:05 AM Delaney Meigs, PT [phone]

## 2014-10-15 NOTE — Progress Notes (Signed)
EPW removed. 9:08 AM

## 2014-10-15 NOTE — Discharge Instructions (Signed)
Activity: 1.May walk up steps °               2.No lifting more than ten pounds for four weeks.  °               3.No driving for four weeks. °               4.Stop any activity that causes chest pain, shortness of breath, dizziness, sweating or excessive weakness. °               5.Avoid straining. °               6.Continue with your breathing exercises daily. ° °Diet: Diabetic diet and Low fat, Low salt diet ° °Wound Care: May shower.  Clean wounds with mild soap and water daily. Contact the office at [phone] if any problems arise. ° °Coronary Artery Bypass Grafting, Care After °Refer to this sheet in the next few weeks. These instructions provide you with information on caring for yourself after your procedure. Your health care provider may also give you more specific instructions. Your treatment has been planned according to current medical practices, but problems sometimes occur. Call your health care provider if you have any problems or questions after your procedure. °WHAT TO EXPECT AFTER THE PROCEDURE °Recovery from surgery will be different for everyone. Some people feel well after 3 or 4 weeks, while for others it takes longer. After your procedure, it is typical to have the following: °· Nausea and a lack of appetite.   °· Constipation. °· Weakness and fatigue.   °· Depression or irritability.   °· Pain or discomfort at your incision site. °HOME CARE INSTRUCTIONS °· Take medicines only as directed by your health care provider. Do not stop taking medicines or start any new medicines without first checking with your health care provider. °· Take your pulse as directed by your health care provider. °· Perform deep breathing as directed by your health care provider. If you were given a device called an incentive spirometer, use it to practice deep breathing several times a day. Support your chest with a pillow or your arms when you take deep breaths or cough. °· Keep incision areas clean, dry, and  protected. Remove or change any bandages (dressings) only as directed by your health care provider. You may have skin adhesive strips over the incision areas. Do not take the strips off. They will fall off on their own. °· Check incision areas daily for any swelling, redness, or drainage. °· If incisions were made in your legs, do the following: °¨ Avoid crossing your legs.   °¨ Avoid sitting for long periods of time. Change positions every 30 minutes.   °¨ Elevate your legs when you are sitting. °· Wear compression stockings as directed by your health care provider. These stockings help keep blood clots from forming in your legs. °· Take showers once your health care provider approves. Until then, only take sponge baths. Pat incisions dry. Do not rub incisions with a washcloth or towel. Do not take baths, swim, or use a hot tub until your health care provider approves. °· Eat foods that are high in fiber, such as raw fruits and vegetables, whole grains, beans, and nuts. Meats should be lean cut. Avoid canned, processed, and fried foods. °· Drink enough fluid to keep your urine clear or pale yellow. °· Weigh yourself every day. This helps identify if you are retaining fluid that may make your heart and lungs   work harder. °· Rest and limit activity as directed by your health care provider. You may be instructed to: °¨ Stop any activity at once if you have chest pain, shortness of breath, irregular heartbeats, or dizziness. Get help right away if you have any of these symptoms. °¨ Move around frequently for short periods or take short walks as directed by your health care provider. Increase your activities gradually. You may need physical therapy or cardiac rehabilitation to help strengthen your muscles and build your endurance. °¨ Avoid lifting, pushing, or pulling anything heavier than 10 lb (4.5 kg) for at least 6 weeks after surgery. °· Do not drive until your health care provider approves.  °· Ask your health  care provider when you may return to work. °· Ask your health care provider when you may resume sexual activity. °· Keep all follow-up visits as directed by your health care provider. This is important. °SEEK MEDICAL CARE IF: °· You have swelling, redness, increasing pain, or drainage at the site of an incision. °· You have a fever. °· You have swelling in your ankles or legs. °· You have pain in your legs.   °· You gain 2 or more pounds (0.9 kg) a day. °· You are nauseous or vomit. °· You have diarrhea.  °SEEK IMMEDIATE MEDICAL CARE IF: °· You have chest pain that goes to your jaw or arms. °· You have shortness of breath.   °· You have a fast or irregular heartbeat.   °· You notice a "clicking" in your breastbone (sternum) when you move.   °· You have numbness or weakness in your arms or legs. °· You feel dizzy or light-headed.   °MAKE SURE YOU: °· Understand these instructions. °· Will watch your condition. °· Will get help right away if you are not doing well or get worse. °Document Released: 01/27/2005 Document Revised: 11/24/2013 Document Reviewed: 12/17/2012 °[employer]® Patient Information ©2015 [employer]. This information is not intended to replace advice given to you by your health care provider. Make sure you discuss any questions you have with your health care provider. ° ° ° °

## 2014-10-15 NOTE — Progress Notes (Signed)
Pt ambulated 130ft with rolling walker on room air.  Pt tolerated well.  Will continue to monitor.  Erenest Rasher, RN

## 2014-10-15 NOTE — Progress Notes (Signed)
CARDIAC REHAB PHASE I   PRE:  Rate/Rhythm: 80 SR    BP: sitting 100/56    SaO2: 96-97 RA  MODE:  Ambulation: 590 ft   POST:  Rate/Rhythm: 98 SR    BP: sitting 101/54     SaO2: 97 RA  Pt moving well, almost independent. Likes to use RW and wants one for d/c. No major c/o. Return to recliner. SaO2 much improved this pm. Practicing IS. Will f/u for ed tomorrow. [id number]   Elissa Lovett [place] CES, ACSM 10/15/2014 1:40 PM

## 2014-10-15 NOTE — Discharge Summary (Signed)
Physician Discharge Summary       [address]       [place] [address]             [phone]    Patient ID: Anthony Vasquez MRN: [id number] DOB/AGE: 1946/04/05 69 y.o.  Admit date: 10/07/2014 Discharge date: 10/16/2014  Admission Diagnoses: 1. S/p NSTEMI 2. Cardiogenic shock 3. 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 History of hypertension 5. History of dyslipidemia  Discharge Diagnoses:  1. S/p NSTEMI 2. Cardiogenic shock 3. 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 History of hypertension 5. History of dyslipidemia 6. Pre diabetes (HGA1C 6) 7. ABL anemia   Procedure (s):  Cardiac catheterization done by  Dr. Eldridge Dace on 10/08/2014: PROCEDURE: Left heart catheterization with selective coronary angiography, left ventriculogram. Impella placement  INDICATIONS: Non-STEMI, cardiogenic shock, acute systolic heart failure, persistent chest pain, pulmonary edema  The risks, benefits, and details of the procedure were explained to the patient. The patient verbalized understanding and wanted to proceed. Informed written consent was obtained.  PROCEDURE TECHNIQUE: After Xylocaine anesthesia a 18F slender sheath was placed in the right radial artery with a single anterior needle wall stick. IV Heparin was given. Right coronary angiography was done using a Judkins R4 guide catheter. Left coronary angiography was done using a Judkins L3.5 guide catheter. Left ventriculography was done using a pigtail catheter. At this point, the patient began having chest discomfort. His blood pressure was low. Due to his severe coronary artery disease and low ejection fraction, cardiac surgery was called emergently. He decided to place an Impella catheter. Right femoral access was obtained with a 5 French sheath. Serial dilation of the right femoral artery was performed. The palate sheath was placed. Pigtail catheter was advanced to the left ventricle. The Impella wire was then switched out  for the pigtail. The catheter was adjusted based on fluoroscopic placement. He was getting an output of 3.1-3.2 L/m. The him patella was secured. The right radial sheath was also left in place.  CONTRAST: Total of 85 cc.  COMPLICATIONS: None.   HEMODYNAMICS: Aortic pressure was 85/53; LV pressure was 87/4; LVEDP 35. There was no gradient between the left ventricle and aorta.   ANGIOGRAPHIC DATA: The left main coronary artery is a very short vessel. There was plaque in the distal left main.  The left anterior descending artery is occluded proximally. There is heavy calcification noted. There is some collateral flow from the RCA to the distal LAD.  The left circumflex artery is a large, dominant vessel. There is a proximal stenosis. This is at least 90% in severity with heavy calcification. Just after the medium size first obtuse marginal, there is another 80% stenosis. The remainder of the circumflex appears patent. There is a large second obtuse marginal which has mild disease. In the left PDA, there is competitive flow with some apparent collateral flow from the RCA territory.  The right coronary artery is a diffusely diseased vessel. It is likely nondominant with some collaterals to the distal circumflex system.  LEFT VENTRICULOGRAM: Left ventricular angiogram was done in the 30 RAO projection revealed regional wall motion abnormalities including anterior and apical hypokinesis of severe degree. Overall left ventricular systolic function with an estimated ejection fraction of 15-20%. LVEDP was 35 mmHg.   IMPRESSIONS:  1. Short left main coronary artery.  2. Severe three-vessel coronary artery disease as noted above. 3. Severely decreased left ventricular systolic function as noted above. LVEDP 35 mmHg. Ejection fraction 15-20%. 4 Successful placement  of an Impella CP device from the right groin, with subsequent resolution of hypotension.     Emergency coronary  artery bypass grafting x3 (left internal mammary artery to left anterior descending artery, saphenous vein graft to ramus intermedius, saphenous vein graft to obtuse marginal). 2. Endoscopic harvest of left leg greater saphenous vein by Dr. Donata Clay on 10/08/2014.  Removal of percutaneous left ventricular assist device (Impella CP) by Dr. Donata Clay on 10/12/2014.  History of Presenting Illness: This is a 69 yo male with known history of hypertension, CKD, and dyslipidemia who presented to [facility] Emergency Department with complaints of increased exercise induced chest discomfort. The patient stated he has actually been having symptoms since February. He experiences chest pressure with radiation down to his left arm usually when working in his yard. EKG in the ED was unremarkable but his Troponin was elevated and he was ruled in for a NSTEMI. He was treated with ASA and placed on a Heparin gttp. The patient was transferred to [facility] for further care. Upon arrival to [facility], the patient was chest pain free and has remained chest pain free prior to cardiac catheterization. He was admitted by Cardiology and taken for cardiac catheterization on 10/08/2014. During the procedure, the patient was noted to have severe mutlivessel CAD with a totally occluded RCA and LAD. There was also significant left main involvement. The patient developed chest pain during the procedure and due to the severity of his CAD an Impella device was placed. Currently, patient is chest pain free. He has some shortness of breath post procedure. Dr. Donata Clay was consulted for the consideration of emergent coronary artery bypass grafting surgery. Potential risks, benefits, and complications were discussed with the patient and he agreed to proceed with surgery. He underwent emergent CABG x 3 on 10/08/2014.  Brief Hospital Course:  The patient was extubated the morning of surgery without difficulty. He remained  afebrile and hemodynamically stable. Impella was removed on 10/12/2014. Milrinone, Dopamine, and Levophed drips were weaned and stopped. The removed on [place], a line, chest tubes, and foley were removed early in the post operative course. Lopressor was started and titrated accordingly. He was volume over loaded and diuresed. He had ABL anemia. His H and H went down to 7.6 and 22.2.  He did not require a post op transfusion. He was started on Trinsicon. His last H and H was up to 8.1 and 24. He was weaned off the insulin drip. The patient's glucose remained well controlled. The patient's HGA1C pre op was 6. He will need further follow up with his medical doctor regarding further surveillance of HGA1C.  The patient was felt surgically stable for transfer from the ICU to [facility]U for further convalescence on 10/14/2014. He continues to progress with PT/cardiac rehab. He was ambulating on room air. He has been tolerating a diet and has had a bowel movement. Epicardial pacing wires and chest tube sutures will be removed prior to discharge. The patient is felt surgically stable for discharge today.  Latest Vital Signs: Blood pressure 101/61, pulse 81, temperature 98.3 F (36.8 C), temperature source Oral, resp. rate 18, height 5\' 6"  (1.676 m), weight 170 lb 13.7 oz (77.5 kg), SpO2 95 %.  Physical Exam: Cardiovascular: RRR Pulmonary: Diminished at bases bilaterally; no rales, wheezes, or rhonchi. Abdomen: Soft, non tender, bowel sounds present. Extremities: Mild bilateral lower extremity edema. Wounds: Clean and dry. No erythema or signs of infection.  Discharge Condition:Stable and discharged to  home  Recent laboratory studies:  Lab Results  Component Value Date   WBC 12.7* 10/15/2014   HGB 8.1* 10/15/2014   HCT 24.0* 10/15/2014   MCV 85.7 10/15/2014   PLT 327 10/15/2014   Lab Results  Component Value Date   NA 134* 10/15/2014   K 3.3* 10/15/2014   CL 100 10/15/2014   CO2 27 10/15/2014    CREATININE 1.10 10/15/2014   GLUCOSE 92 10/15/2014   Diagnostic Studies: Dg Chest 2 View  10/14/2014   CLINICAL DATA:  Status post CABG on October 08, 2014  EXAM: CHEST  2 VIEW  COMPARISON:  Portable chest x-ray of October 13, 2014  FINDINGS: There remains mild elevation of the right hemidiaphragm. Small amounts of pleural fluid blunt the costophrenic angles. The pulmonary interstitial markings remain mildly increased but have improved slightly since yesterday's study with overall improved aeration of the lungs. The cardiac silhouette remains enlarged. The pulmonary vascularity is less engorged and more distinct. There are 6 intact sternal wires. The trachea is midline. The PICC line tip projects over the midportion of the SVC.  IMPRESSION: Continued interval improvement in the appearance of the chest with decreasing pulmonary interstitial edema and resolving subsegmental atelectasis -infiltrates. Tiny bilateral pleural effusions persist. There remains mild elevation of the right hemidiaphragm.   Electronically Signed   By: David  [place]   On: 10/14/2014 07:43   Discharge Medications:   Medication List    STOP taking these medications        olmesartan 40 MG tablet  Commonly known as:  BENICAR      TAKE these medications        amiodarone 200 MG tablet  Commonly known as:  PACERONE  Take 1 tablet (200 mg total) by mouth daily.  Start taking on:  10/17/2014     aspirin 325 MG EC tablet  Take 1 tablet (325 mg total) by mouth daily.     ferrous fumarate-b12-vitamic C-folic acid capsule  Commonly known as:  TRINSICON / FOLTRIN  Take 1 capsule by mouth daily with breakfast. For one month then stop.     furosemide 40 MG tablet  Commonly known as:  LASIX  Take 1 tablet (40 mg total) by mouth daily. For 7 days then stop     lisinopril 2.5 MG tablet  Commonly known as:  PRINIVIL,ZESTRIL  Take 1 tablet (2.5 mg total) by mouth daily.     metoprolol tartrate 25 MG tablet  Commonly known as:   LOPRESSOR  Take 0.5 tablets (12.5 mg total) by mouth 2 (two) times daily.     oxyCODONE 5 MG immediate release tablet  Commonly known as:  Oxy IR/ROXICODONE  Take 1-2 tablets (5-10 mg total) by mouth every 4 (four) hours as needed for severe pain.     potassium chloride SA 20 MEQ tablet  Commonly known as:  K-DUR,KLOR-CON  Take 1 tablet (20 mEq total) by mouth daily. For 7 days then stop.     pravastatin 80 MG tablet  Commonly known as:  PRAVACHOL  Take 80 mg by mouth daily.     Vitamin D (Ergocalciferol) 50000 UNITS Caps capsule  Commonly known as:  DRISDOL  Take 50,000 Units by mouth every 14 (fourteen) days.        The patient has been discharged on:   1.Beta Blocker:  Yes [ x  ]  No   [   ]                              If No, reason:  2.Ace Inhibitor/ARB: Yes [ x  ]                                     No  [    ]                                     If No, reason:  3.Statin:   Yes [ x  ]                  No  [   ]                  If No, reason:  4.Ecasa:  Yes  [ x  ]                  No   [   ]                  If No, reason:  Follow Up Appointments: Follow-up Information    Follow up with Kerin Perna III, MD On 11/18/2014.   Specialty:  Cardiothoracic Surgery   Why:  PA/LAT CXR to be taken (at [facility] which is in the same building as Dr. Zenaida Niece Trigt's office) on 11/18/2014 at 12:00 pm;Appointment with Dr. Donata Clay is on 1:00 pm   Contact information:   [address] [employer] [address] [place] [address] [phone]       Follow up with Tereso Newcomer, PA-C On 11/09/2014.   Specialty:  Physician Assistant   Why:  Appointment time is at 10:10 am   Contact information:   [address] [place] [address] [phone]       Follow up with Junious Silk, MD.   Specialty:  Endocrinology   Why:  Call for a follow up appointment regarding further surveillance of [facility] 6   Contact information:    [facility] [place] [address] [phone]       Signed: Fredda Hammed 10/16/2014, 8:44 AM  patient examined and medical record reviewed,agree with above note. Kathlee Nations Trigt III 10/16/2014

## 2014-10-15 NOTE — Progress Notes (Addendum)
      [address]       [place] [address]             [phone]        7 Days Post-Op Procedure(s) (LRB): CORONARY ARTERY BYPASS GRAFTING (CABG) (N/A) TRANSESOPHAGEAL ECHOCARDIOGRAM (TEE) (N/A)  Subjective: Patient just walked with PT. Has no specific complaints at this time.  Objective: Vital signs in last 24 hours: Temp:  [98.2 F (36.8 C)-98.3 F (36.8 C)] 98.2 F (36.8 C) (03/24 0340) Pulse Rate:  [78-85] 78 (03/24 0340) Cardiac Rhythm:  [-] Normal sinus rhythm (03/23 2000) Resp:  [16-20] 16 (03/24 0340) BP: (90-105)/(49-70) 103/56 mmHg (03/24 0340) SpO2:  [91 %-96 %] 96 % (03/24 0340) Weight:  [173 lb 8 oz (78.7 kg)] 173 lb 8 oz (78.7 kg) (03/24 0340)  Pre op weight 74 kg Current Weight  10/15/14 173 lb 8 oz (78.7 kg)    Hemodynamic parameters for last 24 hours:  stable  Intake/Output from previous day: 03/23 0701 - 03/24 0700 In: 364 [P.O.:240; I.V.:124] Out: 2050 [Urine:2050]   Physical Exam:  Cardiovascular: RRR Pulmonary: Diminished at bases bilaterally; no rales, wheezes, or rhonchi. Abdomen: Soft, non tender, bowel sounds present. Extremities: Mild bilateral lower extremity edema. Wounds: Clean and dry.  No erythema or signs of infection.  Lab Results: CBC: Recent Labs  10/14/14 0505 10/15/14 0625  WBC 11.5* 12.7*  HGB 7.6* 8.1*  HCT 22.2* 24.0*  PLT 189 327   BMET:  Recent Labs  10/14/14 0505 10/15/14 0625  NA 133* 134*  K 3.5 3.3*  CL 99 100  CO2 27 27  GLUCOSE 91 92  BUN 24* 24*  CREATININE 1.29 1.10  CALCIUM 7.5* 8.1*    PT/INR:  Lab Results  Component Value Date   INR 1.08 10/12/2014   INR 0.96 10/09/2014   INR 1.15 10/07/2014   ABG:  INR: Will add last result for INR, ABG once components are confirmed Will add last 4 CBG results once components are confirmed  Assessment/Plan:  1. CV - S/p NSTEMI.Maintaining SR in the 90's. SBP labile in the 90's-low 100's. On Lopressor 12.5 mg bid,  Amiodarone 200 mg bid. Will start ACE in an if BP allows. 2.  Pulmonary - On room air. Encourage incentive spirometer 3. Volume Overload - On Lasix 40 IV daily 4.  Acute blood loss anemia - H and H stable at 8.1 and 24. Continue Trinsicon 5. Supplement potassium 6. CBGs 106/82/84. Pre op HGA1C 6. Likely pre diabetic. Will stop SS and Insulin PRN. Will need further surveillance of HGA1C with medical doctor after discharge. 7. Remove EPW 8. Likely discharge in am  ZIMMERMAN,DONIELLE MPA-C 10/15/2014,8:09 AM  patient examined and medical record reviewed,agree with above note. Kathlee Nations Trigt III 10/15/2014

## 2014-10-15 NOTE — Progress Notes (Signed)
10/15/2014 09:00 PM Patient ambulated about 400 feet in the hallway with a walker and his nurse beside him. The patient tolerated the activity well with no pain or shortness of breath. Patient only needed to stop to rest a few times during the walk.  Will continue to monitor the patient. Harriet Masson

## 2014-10-16 MED ORDER — FUROSEMIDE 40 MG PO TABS: 40.0000 mg | ORAL_TABLET | Freq: Every day | ORAL | Status: DC

## 2014-10-16 MED ORDER — METOPROLOL TARTRATE 25 MG PO TABS: 12.5000 mg | ORAL_TABLET | Freq: Two times a day (BID) | ORAL | Status: DC

## 2014-10-16 MED ORDER — FE FUMARATE-B12-VIT C-FA-IFC PO CAPS: 1.0000 | ORAL_CAPSULE | Freq: Every day | ORAL | Status: DC

## 2014-10-16 MED ORDER — FUROSEMIDE 40 MG PO TABS
40.0000 mg | ORAL_TABLET | Freq: Every day | ORAL | Status: DC
Start: 2014-10-16 — End: 2014-10-16
  Administered 2014-10-16: 40 mg via ORAL
  Filled 2014-10-16: qty 1

## 2014-10-16 MED ORDER — LISINOPRIL 2.5 MG PO TABS: 2.5000 mg | ORAL_TABLET | Freq: Every day | ORAL | Status: DC

## 2014-10-16 MED ORDER — AMIODARONE HCL 200 MG PO TABS: 200.0000 mg | ORAL_TABLET | Freq: Every day | ORAL | Status: DC

## 2014-10-16 MED ORDER — ASPIRIN 325 MG PO TBEC: 325.0000 mg | DELAYED_RELEASE_TABLET | Freq: Every day | ORAL | Status: DC

## 2014-10-16 MED ORDER — OXYCODONE HCL 5 MG PO TABS: 5.0000 mg | ORAL_TABLET | ORAL | Status: DC | PRN

## 2014-10-16 MED ORDER — POTASSIUM CHLORIDE CRYS ER 20 MEQ PO TBCR: 20.0000 meq | EXTENDED_RELEASE_TABLET | Freq: Every day | ORAL | Status: DC

## 2014-10-16 NOTE — Progress Notes (Signed)
Medicare Important Message given? YES  (If response is "NO", the following Medicare IM given date fields will be blank)  Date Medicare IM given: 10/16/14 Medicare IM given by:  Quy Lotts  

## 2014-10-16 NOTE — Care Management Note (Signed)
    Page 1 of 1   10/16/2014     2:09:02 PM CARE MANAGEMENT NOTE 10/16/2014  Patient:  Anthony Vasquez, Anthony Vasquez   Account Number:  [id number]  Date Initiated:  10/12/2014  Documentation initiated by:  [facility]  Subjective/Objective Assessment:   Admitted with CP - NSTEMI -- post op CABG with empella     Action/Plan:   Anticipated DC Date:  10/16/2014   Anticipated DC Plan:  HOME W HOME HEALTH SERVICES      DC Planning Services  CM consult      Choice offered to / List presented to:             Status of service:  Completed, signed off Medicare Important Message given?  YES (If response is "NO", the following Medicare IM given date fields will be blank) Date Medicare IM given:  10/16/2014 Medicare IM given by:  Donn Pierini Date Additional Medicare IM given:   Additional Medicare IM given by:    Discharge Disposition:  HOME/SELF CARE  Per UR Regulation:  Reviewed for med. necessity/level of care/duration of stay  If discussed at Long Length of Stay Meetings, dates discussed:   10/15/2014    Comments:  Contact:  Hild,Debbie Spouse [phone]     10-12-14 11:20am Avie Arenas, RNBSN [phone] Empella removed today.

## 2014-10-16 NOTE — Progress Notes (Signed)
PA paged to put in an order to discontinue PICC line, PA placed the order Aodhan Scheidt 11:21 AM

## 2014-10-16 NOTE — Progress Notes (Signed)
Physical Therapy Treatment/ Discharge Patient Details Name: Anthony Vasquez MRN: [id number] DOB: [birth year] Today's Date: October 31, 2014    History of Present Illness 69 yo man with PMH of hypertension and dyslipidemia who presents with ~ 1 week of increased exercise induced discomfort found to have elevated troponin in the ER consistent with NSTEMI.  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    PT Comments    Pt moving well and able to ambulate without RW entire hallway today with 3 standing rests. Pt able to state 3 of his precautions with education for all with review of handout. Educated pt for activity progression safety with transfers and incentive spirometer use. Pt has met goals and will defer all further therapy to cardiac rehab. Pt aware and in agreement.  Follow Up Recommendations  No PT follow up     Equipment Recommendations       Recommendations for Other Services       Precautions / Restrictions Precautions Precautions: Sternal    Mobility  Bed Mobility Overal bed mobility: Modified Independent     Sidelying to sit: Modified independent (Device/Increase time)     Sit to sidelying: Modified independent (Device/Increase time)    Transfers Overall transfer level: Needs assistance   Transfers: Sit to/from Stand Sit to Stand: Supervision         General transfer comment: cues for hands on thighs to maintain precautions  Ambulation/Gait Ambulation/Gait assistance: Independent Ambulation Distance (Feet): 650 Feet Assistive device: None Gait Pattern/deviations: WFL(Within Functional Limits)         Stairs            Wheelchair Mobility    Modified Rankin (Stroke Patients Only)       Balance                                    Cognition Arousal/Alertness: Awake/alert Behavior During Therapy: WFL for tasks assessed/performed Overall Cognitive Status: Within Functional Limits for tasks assessed                      Exercises  General Exercises - Lower Extremity Long Arc Quad: AROM;Both;Seated;15 reps Hip Flexion/Marching: AROM;Both;15 reps;Seated    General Comments        Pertinent Vitals/Pain Pain Assessment: No/denies pain  HR 90-103    Home Living                      Prior Function            PT Goals (current goals can now be found in the care plan section) Progress towards PT goals: Goals met/education completed, patient discharged from PT    Frequency       PT Plan Current plan remains appropriate    Co-evaluation             End of Session   Activity Tolerance: Patient tolerated treatment well Patient left: in chair;with call bell/phone within Vasquez     Time: 0942-0958 PT Time Calculation (min) (ACUTE ONLY): 16 min  Charges:  $Gait Training: 8-22 mins                    G Codes:      Anthony Vasquez October 31, 2014, 10:31 AM Anthony Vasquez, [place]

## 2014-10-16 NOTE — Progress Notes (Signed)
CARDIAC REHAB PHASE I   Ed completed with pt and wife. Discussed HF as well, low sodium, watching sugars/carbs. Interested in CRPII and will send referral to [place] [facility]. Pt plans to make major change as he previously did no ex and ate mostly fast food.  [phone]  Elissa Lovett [place] CES, ACSM 10/16/2014 11:20 AM

## 2014-10-16 NOTE — Progress Notes (Signed)
Discharge Note  Discontinued IV access Discontinued Telemetry monitor Removed sutures Pt informed about discharge instructions and discharge medications Pt discharged to home on wheelchair by staff All pts belongings sent with pt.  Kendall Flack 10/16/2014 1:30 PM

## 2014-10-16 NOTE — Progress Notes (Addendum)
      [address]       Anthony Vasquez [address]             [phone]        8 Days Post-Op Procedure(s) (LRB): CORONARY ARTERY BYPASS GRAFTING (CABG) (N/A) TRANSESOPHAGEAL ECHOCARDIOGRAM (TEE) (N/A)  Subjective: Patient sitting in chair and has no complaints. Looking forward to going home.  Objective: Vital signs in last 24 hours: Temp:  [97.6 F (36.4 C)-98.6 F (37 C)] 98.3 F (36.8 C) (03/25 0500) Pulse Rate:  [81-107] 81 (03/25 0500) Cardiac Rhythm:  [-] Normal sinus rhythm (03/24 2000) Resp:  [18] 18 (03/25 0500) BP: (101-147)/(56-76) 101/61 mmHg (03/25 0500) SpO2:  [90 %-96 %] 95 % (03/25 0500) Weight:  [170 lb 13.7 oz (77.5 kg)] 170 lb 13.7 oz (77.5 kg) (03/25 0220)  Pre op weight 74 kg Current Weight  10/16/14 170 lb 13.7 oz (77.5 kg)    Intake/Output from previous day: 03/24 0701 - 03/25 0700 In: 600 [P.O.:480; I.V.:120] Out: 1375 [Urine:1375]   Physical Exam:  Cardiovascular: RRR Pulmonary: Slightly diminished at bases bilaterally; no rales, wheezes, or rhonchi. Abdomen: Soft, non tender, bowel sounds present. Extremities: Mild bilateral lower extremity edema. Wounds: Clean and dry.  No erythema or signs of infection.  Lab Results: CBC:  Recent Labs  10/14/14 0505 10/15/14 0625  WBC 11.5* 12.7*  HGB 7.6* 8.1*  HCT 22.2* 24.0*  PLT 189 327   BMET:   Recent Labs  10/14/14 0505 10/15/14 0625  NA 133* 134*  K 3.5 3.3*  CL 99 100  CO2 27 27  GLUCOSE 91 92  BUN 24* 24*  CREATININE 1.29 1.10  CALCIUM 7.5* 8.1*    PT/INR:  Lab Results  Component Value Date   INR 1.08 10/12/2014   INR 0.96 10/09/2014   INR 1.15 10/07/2014   ABG:  INR: Will add last result for INR, ABG once components are confirmed Will add last 4 CBG results once components are confirmed  Assessment/Plan:  1. CV - S/p NSTEMI.Maintaining SR in the 80's.  On Lopressor 12.5 mg bid, Amiodarone 200 mg bid, and Lisinopril 2.5 daily. Will decrease  Amiodarone to 200 daily at discharge. 2.  Pulmonary - On room air. Encourage incentive spirometer 3. Volume Overload - On Lasix 40 IV daily. Will give orally and continue for several days 4.  Acute blood loss anemia - H and H stable at 8.1 and 24. Continue Trinsicon 5. Discharge    ZIMMERMAN,DONIELLE MPA-C 10/16/2014,7:41 AM  patient examined and medical record reviewed,agree with above note. Kathlee Nations Trigt III 10/16/2014

## 2014-10-28 ENCOUNTER — Other Ambulatory Visit: Payer: Self-pay | Admitting: Cardiothoracic Surgery

## 2014-10-28 DIAGNOSIS — Z951 Presence of aortocoronary bypass graft: Secondary | ICD-10-CM

## 2014-10-30 ENCOUNTER — Ambulatory Visit (INDEPENDENT_AMBULATORY_CARE_PROVIDER_SITE_OTHER): Payer: Self-pay | Admitting: Physician Assistant

## 2014-10-30 ENCOUNTER — Encounter: Payer: Self-pay | Admitting: Cardiothoracic Surgery

## 2014-10-30 ENCOUNTER — Ambulatory Visit
Admission: RE | Admit: 2014-10-30 | Discharge: 2014-10-30 | Disposition: A | Discharge status: Home / Self Care | Payer: Medicare Other | Source: Ambulatory Visit | Attending: Cardiothoracic Surgery | Admitting: Cardiothoracic Surgery

## 2014-10-30 VITALS — BP 102/65 | HR 90 | Resp 16 | Ht 66.0 in | Wt 150.0 lb

## 2014-10-30 DIAGNOSIS — Z951 Presence of aortocoronary bypass graft: Secondary | ICD-10-CM

## 2014-10-30 DIAGNOSIS — I2510 Atherosclerotic heart disease of native coronary artery without angina pectoris: Secondary | ICD-10-CM

## 2014-10-30 LAB — CBC
HEMATOCRIT: 27.7 % — AB (ref 39.0–52.0)
Hemoglobin: 8.7 g/dL — ABNORMAL LOW (ref 13.0–17.0)
MCH: 26.3 pg (ref 26.0–34.0)
MCHC: 31.4 g/dL (ref 30.0–36.0)
MCV: 83.7 fL (ref 78.0–100.0)
MPV: 9.6 fL (ref 8.6–12.4)
PLATELETS: 667 10*3/uL — AB (ref 150–400)
RBC: 3.31 MIL/uL — ABNORMAL LOW (ref 4.22–5.81)
RDW: 15.2 % (ref 11.5–15.5)
WBC: 9.3 10*3/uL (ref 4.0–10.5)

## 2014-10-30 MED ORDER — POTASSIUM CHLORIDE ER 20 MEQ PO TBCR: 20.0000 meq | EXTENDED_RELEASE_TABLET | Freq: Every day | ORAL | Status: DC

## 2014-10-30 MED ORDER — FUROSEMIDE 40 MG PO TABS: 40.0000 mg | ORAL_TABLET | Freq: Every day | ORAL | Status: DC

## 2014-10-30 NOTE — Progress Notes (Signed)
  HPI: Patient returns for routine postoperative follow-up having undergone Emergent CABG x 3 on 10/08/2014. He presents today with concerns regarding black stools.  He also states he has had a low grade fever.  He has been taking iron supplements as prescribed since hospital discharge.  He denies nausea, vomiting, cold/flu like symptoms or signs of wound infection.  He also denies shortness of breath and is able to ambulate and lay flat without difficulty.  The patient feels he is progressing well.   Current Outpatient Prescriptions  Medication Sig Dispense Refill  . amiodarone (PACERONE) 200 MG tablet Take 1 tablet (200 mg total) by mouth daily. 30 tablet 1  . aspirin EC 325 MG EC tablet Take 1 tablet (325 mg total) by mouth daily. 30 tablet 0  . ferrous fumarate-b12-vitamic C-folic acid (TRINSICON / FOLTRIN) capsule Take 1 capsule by mouth daily with breakfast. For one month then stop. 30 capsule 0  . lisinopril (PRINIVIL,ZESTRIL) 2.5 MG tablet Take 1 tablet (2.5 mg total) by mouth daily. 30 tablet 1  . metoprolol tartrate (LOPRESSOR) 25 MG tablet Take 0.5 tablets (12.5 mg total) by mouth 2 (two) times daily. 30 tablet 1  . oxyCODONE (OXY IR/ROXICODONE) 5 MG immediate release tablet Take 1-2 tablets (5-10 mg total) by mouth every 4 (four) hours as needed for severe pain. 30 tablet 0  . pravastatin (PRAVACHOL) 80 MG tablet Take 80 mg by mouth daily.    . Vitamin D, Ergocalciferol, (DRISDOL) 50000 UNITS CAPS capsule Take 50,000 Units by mouth every 14 (fourteen) days.    . furosemide (LASIX) 40 MG tablet Take 1 tablet (40 mg total) by mouth daily. 7 tablet 0  . potassium chloride 20 MEQ TBCR Take 20 mEq by mouth daily. For 7 Days 7 tablet 0   No current facility-administered medications for this visit.    Physical Exam:  BP 102/65 mmHg  Pulse 90  Resp 16  Ht 5\' 6"  (1.676 m)  Wt 150 lb (68.04 kg)  BMI 24.22 kg/m2  SpO2 97%  Gen: no apparent distress Heart: RRR Lungs: Diminished left  base Ext: trace edema Incisions; Healing well, chest tube site on left with some yellow slough present, no erythema or drainage present  Diagnostic Tests:  CXR: post operative changes, small left pleural effusion, worse from previous film  A/P:  1. S/P CABG x 3- doing well 2. Small left pleural effusion- this is worse than previous study, however its not significant in size.  Will give a 1 week course of lasix to help resolve fluid.  Patient instructed that should he develop dyspnea or inability to lay flat to please contact our office for repeat CXR 3. Black stools- this is most likely due to Iron consumption.  However, we will check CBC and Hemocult card to ensure no blood in his stool 4. Dispo- patient progressing well post operatively.  He is walking 1/4 mi per day and I have encouraged patient to continue walking as tolerated.  He was instructed to clean chest tube site daily and apply small amount of Bacitracin to the area.  He was instructed to contact our office should drainage develop.  I will bring him back for repeat CXR in 2 weeks to assess effusion.  Will review CBC once completed and contact patient if results are abnormal    Lowella Dandy, PA-C [facility] [phone]

## 2014-11-08 NOTE — Progress Notes (Signed)
Cardiology Office Note   Date:  11/09/2014   ID:  JAYLEND REILAND, DOB 10-04-1945, MRN [id number]  PCP:  Junious Silk, MD  Cardiologist:  Dr. Cassell Clement     Chief Complaint  Patient presents with  . Coronary Artery Disease    s/p CABG     History of Present Illness: Anthony Vasquez is a 69 y.o. male with a hx of HTN, HL, CKD.  He was admitted 3/16-3/25 with a NSTEMI.  He was taken for LHC that demonstrated EF 15-20%, severe 3 v CAD and LM involvement.  He developed chest pain and hypotension during the procedure (cardiogenic shock) and an Impella device was placed. He was taken for emergent CABG by Dr. Donata Clay (L-LAD, S-RI, S-OM).  Post op course was notable for ABL anemia but he did not require PRBCs.  He remained in NSR.  A1c was noted to be 6.  He saw Lowella Dandy, PA-C with Dr. Donata Clay last 4/8.  L effusion was small but worse.  lasix was added for one week.  He returns for FU.    I was called into the room urgently. The patient became near syncopal while getting his blood pressure check. Blood pressure was 80/60.  He did pass out for a few seconds. We put him in Trendelenburg position very quickly and he awoke. EKG was unchanged.  He is in a normal sinus rhythm. Glucose 109.  We have given him IV fluids in the office (250 cc normal saline). He was observed for 90 minutes.  We checked his ECG again.  It was unchanged.  We ambulated him with the CMA.  His O2 remained 95% on RA and he denied any further dizziness.    Over the last couple of weeks, he has done very well. He's been walking several times a day without difficulty. He denies significant shortness of breath. He denies fevers or chills. He denies significant cough. He denies diarrhea or vomiting. He denies orthopnea, PND or edema. He denies edema. His appetite has been improving. He was recently seen for black stools. Hemoglobin was stable. PCP obtain stool cards. He has not heard any results  yet.   Studies/Reports Reviewed Today:  LHC 10/08/14 LM:  plaque in the distal left main. LAD:   occluded proximally.  There is some collateral flow from the RCA to the distal LAD. LCx:   Proximal 90%, Just after first obtuse marginal, there is another 80% stenosis.  RCA:   diffusely diseased vessel.  LV:  wall motion abnormalities including anterior and apical hypokinesis of severe degree.  EF 15-20%.  LVEDP was 35 mmHg.     Past Medical History  Diagnosis Date  . Hypertension   . Renal disorder   . Myocardial infarction 09/2014    nstemi    Past Surgical History  Procedure Laterality Date  . Tonsillectomy    . Colonoscopy  2015  . Left heart catheterization with coronary angiogram N/A 10/08/2014    Procedure: LEFT HEART CATHETERIZATION WITH CORONARY ANGIOGRAM;  Surgeon: Corky Crafts, MD;  Location: [facility] CATH LAB;  Service: Cardiovascular;  Laterality: N/A;  . Coronary artery bypass graft N/A 10/08/2014    Procedure: CORONARY ARTERY BYPASS GRAFTING (CABG);  Surgeon: Kerin Perna, MD;  Location: [facility] OR;  Service: Open Heart Surgery;  Laterality: N/A;  PATIENT IS GETTING AN IMPELLA IN THE CATH LAB CABG times 3 using left internal mammary artery and endoscopically harvested left saphenous vein.  Marland Kitchen  Tee without cardioversion N/A 10/08/2014    Procedure: TRANSESOPHAGEAL ECHOCARDIOGRAM (TEE);  Surgeon: Kerin Perna, MD;  Location: [facility] OR;  Service: Open Heart Surgery;  Laterality: N/A;     Current Outpatient Prescriptions  Medication Sig Dispense Refill  . amiodarone (PACERONE) 200 MG tablet Take 1 tablet (200 mg total) by mouth daily. 30 tablet 1  . aspirin EC 325 MG EC tablet Take 1 tablet (325 mg total) by mouth daily. 30 tablet 0  . ferrous fumarate-b12-vitamic C-folic acid (TRINSICON / FOLTRIN) capsule Take 1 capsule by mouth daily with breakfast. For one month then stop. 30 capsule 0  . lisinopril (PRINIVIL,ZESTRIL) 2.5 MG tablet Take 1 tablet (2.5 mg total) by mouth  daily. 30 tablet 1  . metoprolol tartrate (LOPRESSOR) 25 MG tablet Take 0.5 tablets (12.5 mg total) by mouth 2 (two) times daily. 30 tablet 1  . pravastatin (PRAVACHOL) 80 MG tablet Take 80 mg by mouth daily.    . Vitamin D, Ergocalciferol, (DRISDOL) 50000 UNITS CAPS capsule Take 50,000 Units by mouth every 14 (fourteen) days.     No current facility-administered medications for this visit.    Allergies:   Review of patient's allergies indicates no known allergies.    Social History:  The patient  reports that he has never smoked. He has never used smokeless tobacco. He reports that he does not drink alcohol or use illicit drugs.   Family History:  The patient's family history includes Healthy in his brother; Heart disease in his father and mother.    ROS:   Please see the history of present illness.   Review of Systems  Skin: Positive for rash.  All other systems reviewed and are negative.    PHYSICAL EXAM: VS:  BP 80/48 mmHg  Pulse 73  Ht  (1.676 m)  Wt 144 lb (65.318 kg)  BMI 23.25 kg/m2  SpO2 96%    Wt Readings from Last 3 Encounters:  11/09/14 144 lb (65.318 kg)  10/30/14 150 lb (68.04 kg)  10/16/14 170 lb 13.7 oz (77.5 kg)     GEN: Well nourished, well developed, in no acute distress HEENT: normal Neck: no JVD, no masses Cardiac:  Normal S1/S2, RRR; no murmur ,  no rubs or gallops, no edema  Respiratory:  clear to auscultation bilaterally, no wheezing, rhonchi or rales. GI: soft, nontender, nondistended, + BS MS: no deformity or atrophy Skin: warm and dry  Neuro:  CNs II-XII intact, Strength and sensation are intact Psych: Normal affect   EKG:  EKG is ordered today.  It demonstrates:   NSR, HR 73, PRWP, inf-lat TWI, similar to prior tracings.   Recent Labs: 10/07/2014: B Natriuretic Peptide 426.5*; TSH 3.043 10/09/2014: Magnesium 2.0 11/09/2014: ALT 64*; BUN 22; Creatinine 1.35; Hemoglobin 9.7*; Platelets 522.0*; Potassium 4.3; Sodium 132*    Lipid  Panel    Component Value Date/Time   CHOL 143 10/08/2014 0528   TRIG 88 10/08/2014 0528   HDL 36* 10/08/2014 0528   CHOLHDL 4.0 10/08/2014 0528   VLDL 18 10/08/2014 0528   LDLCALC 89 10/08/2014 0528      ASSESSMENT AND PLAN:  Hypotension, unspecified hypotension type The events of today are likely related to vasovagal episode. As noted, the patient responded well to 250 cc of normal saline. We will obtain a basic metabolic panel, CBC, LFTs. I will hold his lisinopril for now. He will hold his evening dose of metoprolol tonight. He will resume his metoprolol tomorrow.  He will  be brought back for close follow-up in the next 1 week.  I asked him to be liberal somewhat with salt and fluids today.  Coronary artery disease S/P CABG (coronary artery bypass graft) Aside from today's events, he has been progressing well since his discharge from the hospital. He is on amiodarone. I have reviewed his chart. It does not appear that he went into atrial fibrillation postoperatively. We will likely be able to discontinue this medication at some point in the next 1-2 months. I will refer him to cardiac rehabilitation. He will remain on beta blocker, aspirin, statin. Eventually resume ACE inhibitor.   Ischemic cardiomyopathy - Plan: Basic Metabolic Panel (BMET) Continue beta-blocker.  Eventually resume ACE inhibitor.  BP will limit titration of medications.  Obtain Echo.  If EF < 35%, consider Life Vest.  Essential hypertension Adjust medications as noted above for low BP.  Hyperlipidemia Continue statin.    Current medicines are reviewed at length with the patient today.  The patient does not have concerns regarding medicines.  The following changes have been made:    Hold Lisinopril  Hold tonight's dose of Metoprolol.  Resume Metoprolol tomorrow.    Labs/ tests ordered today include:  Orders Placed This Encounter  Procedures  . Basic Metabolic Panel (BMET)  . CBC w/Diff  . Hepatic function  panel  . EKG 12-Lead  . 2D Echocardiogram with contrast    Disposition:   FU with me or Dr. Cassell Clement 1 week.   Signed, Anthony Vasquez, MHS 11/09/2014 5:38 PM    [facility] [address] [place], [place], [place]  [address] Phone: [phone]; Fax: [phone]

## 2014-11-09 ENCOUNTER — Encounter: Payer: Self-pay | Admitting: Physician Assistant

## 2014-11-09 ENCOUNTER — Ambulatory Visit (INDEPENDENT_AMBULATORY_CARE_PROVIDER_SITE_OTHER): Payer: Medicare Other | Admitting: Physician Assistant

## 2014-11-09 VITALS — BP 80/48 | HR 73 | Ht 66.0 in | Wt 144.0 lb

## 2014-11-09 DIAGNOSIS — E785 Hyperlipidemia, unspecified: Secondary | ICD-10-CM

## 2014-11-09 DIAGNOSIS — Z951 Presence of aortocoronary bypass graft: Secondary | ICD-10-CM

## 2014-11-09 DIAGNOSIS — I10 Essential (primary) hypertension: Secondary | ICD-10-CM | POA: Diagnosis not present

## 2014-11-09 DIAGNOSIS — I959 Hypotension, unspecified: Secondary | ICD-10-CM | POA: Diagnosis not present

## 2014-11-09 DIAGNOSIS — I255 Ischemic cardiomyopathy: Secondary | ICD-10-CM

## 2014-11-09 DIAGNOSIS — I2510 Atherosclerotic heart disease of native coronary artery without angina pectoris: Secondary | ICD-10-CM

## 2014-11-09 LAB — HEPATIC FUNCTION PANEL
ALK PHOS: 102 U/L (ref 39–117)
ALT: 64 U/L — ABNORMAL HIGH (ref 0–53)
AST: 33 U/L (ref 0–37)
Albumin: 3.4 g/dL — ABNORMAL LOW (ref 3.5–5.2)
BILIRUBIN TOTAL: 0.4 mg/dL (ref 0.2–1.2)
Bilirubin, Direct: 0.1 mg/dL (ref 0.0–0.3)
Total Protein: 7.4 g/dL (ref 6.0–8.3)

## 2014-11-09 LAB — BASIC METABOLIC PANEL
BUN: 22 mg/dL (ref 6–23)
CO2: 22 mEq/L (ref 19–32)
Calcium: 9.6 mg/dL (ref 8.4–10.5)
Chloride: 100 mEq/L (ref 96–112)
Creatinine, Ser: 1.35 mg/dL (ref 0.40–1.50)
GFR: 55.76 mL/min — AB (ref >60.00)
GLUCOSE: 95 mg/dL (ref 70–99)
Potassium: 4.3 mEq/L (ref 3.5–5.1)
SODIUM: 132 meq/L — AB (ref 135–145)

## 2014-11-09 LAB — CBC WITH DIFFERENTIAL/PLATELET
BASOS ABS: 0 10*3/uL (ref 0.0–0.1)
Basophils Relative: 0.2 % (ref 0.0–3.0)
Eosinophils Absolute: 0.1 10*3/uL (ref 0.0–0.7)
Eosinophils Relative: 1.2 % (ref 0.0–5.0)
HEMATOCRIT: 30.2 % — AB (ref 39.0–52.0)
Hemoglobin: 9.7 g/dL — ABNORMAL LOW (ref 13.0–17.0)
LYMPHS PCT: 14.7 % (ref 12.0–46.0)
Lymphs Abs: 1.3 10*3/uL (ref 0.7–4.0)
MCHC: 32.2 g/dL (ref 30.0–36.0)
MCV: 81 fl (ref 78.0–100.0)
MONOS PCT: 10.9 % (ref 3.0–12.0)
Monocytes Absolute: 1 10*3/uL (ref 0.1–1.0)
NEUTROS ABS: 6.4 10*3/uL (ref 1.4–7.7)
NEUTROS PCT: 73 % (ref 43.0–77.0)
Platelets: 522 10*3/uL — ABNORMAL HIGH (ref 150.0–400.0)
RBC: 3.73 Mil/uL — ABNORMAL LOW (ref 4.22–5.81)
RDW: 16.1 % — AB (ref 11.5–15.5)
WBC: 8.8 10*3/uL (ref 4.0–10.5)

## 2014-11-09 NOTE — Patient Instructions (Addendum)
Medication Instructions:  1. HOLD LISINOPRIL UNTIL FURTHER NOTIFIED BY CARDIOLOGY  2. HOLD METOPROLOL TONIGHT; THEN RESUME 11/10/14 MORNING  Labwork: TODAY; BMET, CBC, LFT  Testing/Procedures: Your physician has requested that you have an echocardiogram WITH CONTRAST. Echocardiography is a painless test that uses sound waves to create images of your heart. It provides your doctor with information about the size and shape of your heart and how well your heart's chambers and valves are working. This procedure takes approximately one hour. There are no restrictions for this procedure.  Follow-Up: 11/16/14  Any Other Special Instructions Will Be Listed Below (If Applicable).

## 2014-11-10 ENCOUNTER — Telehealth: Payer: Self-pay | Admitting: *Deleted

## 2014-11-10 ENCOUNTER — Other Ambulatory Visit: Payer: Self-pay | Admitting: Cardiothoracic Surgery

## 2014-11-10 DIAGNOSIS — I10 Essential (primary) hypertension: Secondary | ICD-10-CM

## 2014-11-10 DIAGNOSIS — Z951 Presence of aortocoronary bypass graft: Secondary | ICD-10-CM

## 2014-11-10 DIAGNOSIS — I5021 Acute systolic (congestive) heart failure: Secondary | ICD-10-CM

## 2014-11-10 NOTE — Telephone Encounter (Signed)
pt notified about lab results and recommendations with verbal understanding. Pt aware needs repeat LFT in 2 weeks due to ALT slightly elevated and Albumin low. Pt has appt w/Scott W. PA 4/25.

## 2014-11-11 ENCOUNTER — Ambulatory Visit: Payer: Self-pay | Admitting: Cardiothoracic Surgery

## 2014-11-12 ENCOUNTER — Ambulatory Visit (INDEPENDENT_AMBULATORY_CARE_PROVIDER_SITE_OTHER): Payer: Self-pay | Admitting: Cardiothoracic Surgery

## 2014-11-12 ENCOUNTER — Encounter: Payer: Self-pay | Admitting: Cardiothoracic Surgery

## 2014-11-12 ENCOUNTER — Ambulatory Visit
Admission: RE | Admit: 2014-11-12 | Discharge: 2014-11-12 | Disposition: A | Discharge status: Home / Self Care | Payer: Medicare Other | Source: Ambulatory Visit | Attending: Cardiothoracic Surgery | Admitting: Cardiothoracic Surgery

## 2014-11-12 ENCOUNTER — Ambulatory Visit (HOSPITAL_COMMUNITY): Payer: Medicare Other | Attending: Physician Assistant | Admitting: Radiology

## 2014-11-12 VITALS — BP 117/71 | HR 81 | Resp 16 | Ht 66.0 in | Wt 144.0 lb

## 2014-11-12 DIAGNOSIS — I255 Ischemic cardiomyopathy: Secondary | ICD-10-CM | POA: Diagnosis present

## 2014-11-12 DIAGNOSIS — Z951 Presence of aortocoronary bypass graft: Secondary | ICD-10-CM | POA: Insufficient documentation

## 2014-11-12 DIAGNOSIS — I2510 Atherosclerotic heart disease of native coronary artery without angina pectoris: Secondary | ICD-10-CM

## 2014-11-12 DIAGNOSIS — E785 Hyperlipidemia, unspecified: Secondary | ICD-10-CM | POA: Insufficient documentation

## 2014-11-12 NOTE — Progress Notes (Signed)
PCP is Junious Silk, MD Referring Provider is Corky Crafts, MD  Chief Complaint  Patient presents with  . Routine Post Op    2 wk f/u with cxr...f/u left effusion    HPI:    6 week followup status post emergency CABG for preoperative MI, preoperative balloon pump. Patient is done well without recurrent symptoms of angina or CHF. Surgical incisions are well-healed. During the patient's cardiology office visit he had a syncopal episode. His lisinopril was stopped which was prescribed the patient per guidelines. A followup echocardiogram shows good LV function with some mild apical hypokinesia. His had no more presyncope.  The patient developed a generalized body rash probably related to his amiodarone which will discontinue-200 mg daily. The patient has had significant problems with constipation, probably from his oral iron and we will stop that.  The patient can progresses activity level now to include driving and lifting up to 20 pounds in light yard work. The patient will start outpatient cardiac rehabilitation in the near future.   Past Medical History  Diagnosis Date  . Hypertension   . Renal disorder   . Myocardial infarction 09/2014    nstemi    Past Surgical History  Procedure Laterality Date  . Tonsillectomy    . Colonoscopy  2015  . Left heart catheterization with coronary angiogram N/A 10/08/2014    Procedure: LEFT HEART CATHETERIZATION WITH CORONARY ANGIOGRAM;  Surgeon: Corky Crafts, MD;  Location: [facility] CATH LAB;  Service: Cardiovascular;  Laterality: N/A;  . Coronary artery bypass graft N/A 10/08/2014    Procedure: CORONARY ARTERY BYPASS GRAFTING (CABG);  Surgeon: Kerin Perna, MD;  Location: [facility] OR;  Service: Open Heart Surgery;  Laterality: N/A;  PATIENT IS GETTING AN IMPELLA IN THE CATH LAB CABG times 3 using left internal mammary artery and endoscopically harvested left saphenous vein.  Marland Kitchen Tee without cardioversion N/A 10/08/2014    Procedure:  TRANSESOPHAGEAL ECHOCARDIOGRAM (TEE);  Surgeon: Kerin Perna, MD;  Location: [facility] OR;  Service: Open Heart Surgery;  Laterality: N/A;    Family History  Problem Relation Age of Onset  . Heart disease Mother   . Heart disease Father   . Healthy Brother     Social History History  Substance Use Topics  . Smoking status: Never Smoker   . Smokeless tobacco: Never Used  . Alcohol Use: No    Current Outpatient Prescriptions  Medication Sig Dispense Refill  . amiodarone (PACERONE) 200 MG tablet Take 1 tablet (200 mg total) by mouth daily. 30 tablet 1  . aspirin EC 325 MG EC tablet Take 1 tablet (325 mg total) by mouth daily. 30 tablet 0  . ferrous fumarate-b12-vitamic C-folic acid (TRINSICON / FOLTRIN) capsule Take 1 capsule by mouth daily with breakfast. For one month then stop. 30 capsule 0  . metoprolol tartrate (LOPRESSOR) 25 MG tablet Take 0.5 tablets (12.5 mg total) by mouth 2 (two) times daily. 30 tablet 1  . pravastatin (PRAVACHOL) 80 MG tablet Take 80 mg by mouth daily.    . Vitamin D, Ergocalciferol, (DRISDOL) 50000 UNITS CAPS capsule Take 50,000 Units by mouth every 14 (fourteen) days.    Marland Kitchen lisinopril (PRINIVIL,ZESTRIL) 2.5 MG tablet Take 1 tablet (2.5 mg total) by mouth daily. (Patient not taking: Reported on 11/12/2014) 30 tablet 1   No current facility-administered medications for this visit.    No Known Allergies  Review of Systems  No fever or night sweats Improved appetite, improved overall exercise tolerance Incisions healing  well   BP 117/71 mmHg  Pulse 81  Resp 16  Ht  (1.676 m)  Wt 144 lb (65.318 kg)  BMI 23.25 kg/m2  SpO2 98% Physical Exam Alert and comfortable Lungs clear Sternum well-healed Heart rate regular without murmur Abdomen soft No pedal edema Neuro intact  Diagnostic Tests: Chest x-ray shows resolution of left pleural effusion, sternal wires intact normal cardiac silhouette  Impression: Excellent early recovery 6 weeks post  emergency CABG for acute MI, preoperative balloon pump  Plan: Continue current medications. Start outpatient cardiac. Increase activity level as outlined above. Return for review in 6 weeks.  Mikey Bussing, MD [facility] [phone]

## 2014-11-12 NOTE — Progress Notes (Signed)
Echocardiogram performed.  

## 2014-11-16 ENCOUNTER — Other Ambulatory Visit: Payer: Medicare Other | Admitting: *Deleted

## 2014-11-16 ENCOUNTER — Encounter: Payer: Self-pay | Admitting: Physician Assistant

## 2014-11-16 ENCOUNTER — Ambulatory Visit (INDEPENDENT_AMBULATORY_CARE_PROVIDER_SITE_OTHER): Payer: Medicare Other | Admitting: Physician Assistant

## 2014-11-16 VITALS — BP 110/60 | HR 88 | Ht 66.0 in | Wt 147.0 lb

## 2014-11-16 DIAGNOSIS — R7989 Other specified abnormal findings of blood chemistry: Secondary | ICD-10-CM

## 2014-11-16 DIAGNOSIS — I255 Ischemic cardiomyopathy: Secondary | ICD-10-CM | POA: Diagnosis not present

## 2014-11-16 DIAGNOSIS — I5022 Chronic systolic (congestive) heart failure: Secondary | ICD-10-CM | POA: Diagnosis not present

## 2014-11-16 DIAGNOSIS — I2510 Atherosclerotic heart disease of native coronary artery without angina pectoris: Secondary | ICD-10-CM | POA: Diagnosis not present

## 2014-11-16 DIAGNOSIS — R945 Abnormal results of liver function studies: Secondary | ICD-10-CM

## 2014-11-16 DIAGNOSIS — I10 Essential (primary) hypertension: Secondary | ICD-10-CM

## 2014-11-16 DIAGNOSIS — I959 Hypotension, unspecified: Secondary | ICD-10-CM

## 2014-11-16 DIAGNOSIS — E785 Hyperlipidemia, unspecified: Secondary | ICD-10-CM

## 2014-11-16 DIAGNOSIS — R319 Hematuria, unspecified: Secondary | ICD-10-CM

## 2014-11-16 MED ORDER — METOPROLOL TARTRATE 25 MG PO TABS: 12.5000 mg | ORAL_TABLET | Freq: Two times a day (BID) | ORAL | Status: DC

## 2014-11-16 NOTE — Progress Notes (Signed)
Cardiology Office Note   Date:  11/16/2014   ID:  Anthony Vasquez, Anthony Vasquez 08-22-1945, MRN [id number]  PCP:  Anthony Silk, MD  Cardiologist:  Dr. Cassell Vasquez     Chief Complaint  Patient presents with  . Cardiomyopathy  . Coronary Artery Disease  . Follow-up     History of Present Illness: Anthony Vasquez is a 69 y.o. male with a hx of HTN, HL, CKD.  He was admitted 3/16-3/25 with a NSTEMI.  He was taken for LHC that demonstrated EF 15-20%, severe 3 v CAD and LM involvement.  He developed chest pain and hypotension during the procedure (cardiogenic shock) and an Impella device was placed. He was taken for emergent CABG by Dr. Donata Vasquez (L-LAD, S-RI, S-OM).  Post op course was notable for ABL anemia but he did not require PRBCs.  He remained in NSR.  He has had a post op L effusion.      I saw him 4/18 for FU.  He developed vasovagal syncope in the office.  BP was in the 80s.  We gave him IVFs and held his ACE inhibitor. He returns for early FU.  Since last seen, he has been doing very well. He denies any dizziness or near-syncope. He has been walking daily. He denies significant dyspnea. He denies orthopnea, PND or edema. He did see some dried specks of blood in his urine one day last week. He denies any bright red blood. He denies any pain. He denies recurrence.   Studies/Reports Reviewed Today:  Echo 11/12/14 - Mild LVH. EF 35% to 40%. Mid to apical anteroseptal akinesis, apical inferior akinesis, apical anterior akinesis, akinesis of the true apex. Grade 1 diastolic dysfunction  - Aortic valve: There was no stenosis. - Mitral valve: There was trivial regurgitation. - Right ventricle: The cavity size was normal. Systolic function was mildly reduced. - Pulmonary arteries: No complete TR doppler jet so unable to estimate PA systolic pressure. Impressions:   Normal LV size with mild LV hypertrophy. EF 35-40%. Wall motion abnormalities as noted above. Normal RV size with  mildly reduced systolic function. No significant valvular abnormalities.  LHC 10/08/14 LM:  plaque in the distal left main. LAD:   occluded proximally.  There is some collateral flow from the RCA to the distal LAD. LCx:   Proximal 90%, Just after first obtuse marginal, there is another 80% stenosis.  RCA:   diffusely diseased vessel.  LV:  wall motion abnormalities including anterior and apical hypokinesis of severe degree.  EF 15-20%.  LVEDP was 35 mmHg.     Past Medical History  Diagnosis Date  . Hypertension   . Renal disorder   . Myocardial infarction 09/2014    nstemi    Past Surgical History  Procedure Laterality Date  . Tonsillectomy    . Colonoscopy  2015  . Left heart catheterization with coronary angiogram N/A 10/08/2014    Procedure: LEFT HEART CATHETERIZATION WITH CORONARY ANGIOGRAM;  Surgeon: Anthony Crafts, MD;  Location: [facility] CATH LAB;  Service: Cardiovascular;  Laterality: N/A;  . Coronary artery bypass graft N/A 10/08/2014    Procedure: CORONARY ARTERY BYPASS GRAFTING (CABG);  Surgeon: Anthony Perna, MD;  Location: [facility] OR;  Service: Open Heart Surgery;  Laterality: N/A;  PATIENT IS GETTING AN IMPELLA IN THE CATH LAB CABG times 3 using left internal mammary artery and endoscopically harvested left saphenous vein.  Marland Kitchen Tee without cardioversion N/A 10/08/2014    Procedure: TRANSESOPHAGEAL ECHOCARDIOGRAM (TEE);  Surgeon: Anthony Perna, MD;  Location: [facility] OR;  Service: Open Heart Surgery;  Laterality: N/A;     Current Outpatient Prescriptions  Medication Sig Dispense Refill  . aspirin EC 325 MG EC tablet Take 1 tablet (325 mg total) by mouth daily. 30 tablet 0  . metoprolol tartrate (LOPRESSOR) 25 MG tablet Take 0.5 tablets (12.5 mg total) by mouth 2 (two) times daily. 30 tablet 1  . pravastatin (PRAVACHOL) 80 MG tablet Take 80 mg by mouth daily.    . Vitamin D, Ergocalciferol, (DRISDOL) 50000 UNITS CAPS capsule Take 50,000 Units by mouth every 14 (fourteen) days.      No current facility-administered medications for this visit.    Allergies:   Review of patient's allergies indicates no known allergies.    Social History:  The patient  reports that he has never smoked. He has never used smokeless tobacco. He reports that he does not drink alcohol or use illicit drugs.   Family History:  The patient's family history includes Healthy in his brother; Heart attack in his mother; Heart disease in his father and mother. There is no history of Stroke.    ROS:   Please see the history of present illness.   Review of Systems  Gastrointestinal: Positive for constipation.  Genitourinary: Positive for hematuria.       ??  All other systems reviewed and are negative.    PHYSICAL EXAM: VS:  BP 110/60 mmHg  Pulse 88  Ht  (1.676 m)  Wt 147 lb (66.679 kg)  BMI 23.74 kg/m2  SpO2 98%    Wt Readings from Last 3 Encounters:  11/16/14 147 lb (66.679 kg)  11/12/14 144 lb (65.318 kg)  11/09/14 144 lb (65.318 kg)     GEN: Well nourished, well developed, in no acute distress HEENT: normal Neck: no JVD, no masses Cardiac:  Normal S1/S2, RRR; no murmur ,  no rubs or gallops, no edema  Respiratory:  clear to auscultation bilaterally, no wheezing, rhonchi or rales. GI: soft, nontender, nondistended, + BS MS: no deformity or atrophy Skin: warm and dry  Neuro:  CNs II-XII intact, Strength and sensation are intact Psych: Normal affect   EKG:  EKG is not ordered today.  It demonstrates:   n/a   Recent Labs: 10/07/2014: B Natriuretic Peptide 426.5*; TSH 3.043 10/09/2014: Magnesium 2.0 11/09/2014: ALT 64*; BUN 22; Creatinine 1.35; Hemoglobin 9.7*; Platelets 522.0*; Potassium 4.3; Sodium 132*    Lipid Panel    Component Value Date/Time   CHOL 143 10/08/2014 0528   TRIG 88 10/08/2014 0528   HDL 36* 10/08/2014 0528   CHOLHDL 4.0 10/08/2014 0528   VLDL 18 10/08/2014 0528   LDLCALC 89 10/08/2014 0528      ASSESSMENT AND PLAN:  Hypotension,  unspecified hypotension type Resolved.    Coronary artery disease S/P CABG (coronary artery bypass graft) Continues to progress well since his CABG.  Continue ASA, statin, beta-blocker.  He will start cardiac rehab soon.  Ischemic cardiomyopathy  Recent Echo demonstrated improved LVF with EF 35-40%.  Continue beta-blocker.  I do not think his BP will tolerate resuming his ACE inhibitor now.  Will consider this at FU.  Consider repeat limited echo for EF at 90 days post CABG.  Essential hypertension Controlled.   Hyperlipidemia Continue statin.  Managed by PCP.    Hematuria He describes dried blood in his urine.  This was not painful.  I have asked him to FU with primary care for further  evaluation of this.    Elevated LFTs Repeat LFTs today.   Current medicines are reviewed at length with the patient today.  The patient does not have concerns regarding medicines.  The following changes have been made:    None    Labs/ tests ordered today include:   No orders of the defined types were placed in this encounter.    Disposition:   FU Dr. Cassell Vasquez 3-4 weeks.    Signed, Brynda Rim, MHS 11/16/2014 3:31 PM    [facility] [address] [place], [place], [place]  [address] Phone: [phone]; Fax: [phone]

## 2014-11-16 NOTE — Patient Instructions (Signed)
Medication Instructions:  None  Labwork: LFT today  Testing/Procedures: None  Follow-Up: Your physician recommends that you schedule a follow-up appointment in: 3-4 weeks with Dr. Patty Sermons (Dec 10, 2014 at 9:45AM)   Any Other Special Instructions Will Be Listed Below (If Applicable).

## 2014-11-17 ENCOUNTER — Encounter: Payer: Self-pay | Admitting: Physician Assistant

## 2014-11-17 LAB — HEPATIC FUNCTION PANEL
ALT: 41 U/L (ref 0–53)
AST: 23 U/L (ref 0–37)
Albumin: 3.5 g/dL (ref 3.5–5.2)
Alkaline Phosphatase: 98 U/L (ref 39–117)
Bilirubin, Direct: 0.1 mg/dL (ref 0.0–0.3)
Total Bilirubin: 0.3 mg/dL (ref 0.2–1.2)
Total Protein: 7.1 g/dL (ref 6.0–8.3)

## 2014-11-18 ENCOUNTER — Ambulatory Visit: Admitting: Cardiothoracic Surgery

## 2014-12-03 ENCOUNTER — Ambulatory Visit: Payer: Medicare Other | Admitting: Physician Assistant

## 2014-12-10 ENCOUNTER — Ambulatory Visit (INDEPENDENT_AMBULATORY_CARE_PROVIDER_SITE_OTHER): Payer: Medicare Other | Admitting: Cardiology

## 2014-12-10 ENCOUNTER — Encounter: Payer: Self-pay | Admitting: Cardiology

## 2014-12-10 VITALS — BP 110/80 | HR 62 | Ht 66.0 in | Wt 150.0 lb

## 2014-12-10 DIAGNOSIS — I255 Ischemic cardiomyopathy: Secondary | ICD-10-CM

## 2014-12-10 DIAGNOSIS — Z951 Presence of aortocoronary bypass graft: Secondary | ICD-10-CM | POA: Diagnosis not present

## 2014-12-10 DIAGNOSIS — I2510 Atherosclerotic heart disease of native coronary artery without angina pectoris: Secondary | ICD-10-CM

## 2014-12-10 NOTE — Patient Instructions (Signed)
Medication Instructions:  Your physician recommends that you continue on your current medications as directed. Please refer to the Current Medication list given to you today.   Labwork: noe  Testing/Procedures: Your physician has requested that you have an echocardiogram. Echocardiography is a painless test that uses sound waves to create images of your heart. It provides your doctor with information about the size and shape of your heart and how well your heart's chambers and valves are working. This procedure takes approximately one hour. There are no restrictions for this procedure. ABOUT 1 WEE PRIOR TO 2 MONTH FOLLOW UP   Follow-Up: Your physician recommends that you schedule a follow-up appointment in: 2 MONTH OV/EKG

## 2014-12-10 NOTE — Progress Notes (Signed)
Cardiology Office Note   Date:  12/10/2014   ID:  Anthony Vasquez, DOB 05-27-1946, MRN [id number]  PCP:  Junious Silk, MD  Cardiologist: Cassell Clement MD  No chief complaint on file.     History of Present Illness: Anthony Vasquez is a 69 y.o. male who presents for scheduled follow-up office visit  Anthony Vasquez is a 69 y.o. male with a hx of HTN, HL, CKD. He was admitted 3/16-3/25 with a NSTEMI. He was taken for LHC that demonstrated EF 15-20%, severe 3 v CAD and LM involvement. He developed chest pain and hypotension during the procedure (cardiogenic shock) and an Impella device was placed. He was taken for emergent CABG by Dr. Donata Clay (L-LAD, S-RI, S-OM). Post op course was notable for ABL anemia but he did not require PRBCs. He remained in NSR. He has had a post op L effusion.   The patient was seen on 11/09/14 in office follow-up. He developed vasovagal syncope in the office. BP was in the 80s. We gave him IVFs and held his ACE inhibitor.  Since stopping the ACE inhibitor he has had no further episodes of dizziness or syncope.  His blood pressure at home rarely goes above 110 systolic. The patient had an echocardiogram 11/12/14 which demonstrated: - Mild LVH. EF 35% to 40%. Mid to apical anteroseptal akinesis, apical inferior akinesis, apical anterior akinesis, akinesis of the true apex. Grade 1 diastolic dysfunction  - Aortic valve: There was no stenosis. - Mitral valve: There was trivial regurgitation. - Right ventricle: The cavity size was normal. Systolic function was mildly reduced. - Pulmonary arteries: No complete TR doppler jet so unable to estimate PA systolic pressure. Impressions: Normal LV size with mild LV hypertrophy. EF 35-40%. Wall motion abnormalities as noted above. Normal RV size with mildly reduced systolic function. No significant valvular abnormalities.  The patient has not been experiencing any recurrent chest pain or angina.  He  is scheduled to start cardiac rehabilitation in early June.  His CABG was on March 17.  He has now been walking up to a mile a day.  Past Medical History  Diagnosis Date  . Hypertension   . Renal disorder   . Myocardial infarction 09/2014    nstemi  . Ischemic cardiomyopathy     Echo 4/16: Mild LVH, EF 35-40%, anteroseptal, apical, inferior, apical anterior anteroapical akinesis, grade 1 diastolic dysfunction, mildly reduced RVSF    Past Surgical History  Procedure Laterality Date  . Tonsillectomy    . Colonoscopy  2015  . Left heart catheterization with coronary angiogram N/A 10/08/2014    Procedure: LEFT HEART CATHETERIZATION WITH CORONARY ANGIOGRAM;  Surgeon: Corky Crafts, MD;  Location: [facility] CATH LAB;  Service: Cardiovascular;  Laterality: N/A;  . Coronary artery bypass graft N/A 10/08/2014    Procedure: CORONARY ARTERY BYPASS GRAFTING (CABG);  Surgeon: Kerin Perna, MD;  Location: [facility] OR;  Service: Open Heart Surgery;  Laterality: N/A;  PATIENT IS GETTING AN IMPELLA IN THE CATH LAB CABG times 3 using left internal mammary artery and endoscopically harvested left saphenous vein.  Marland Kitchen Tee without cardioversion N/A 10/08/2014    Procedure: TRANSESOPHAGEAL ECHOCARDIOGRAM (TEE);  Surgeon: Kerin Perna, MD;  Location: [facility] OR;  Service: Open Heart Surgery;  Laterality: N/A;     Current Outpatient Prescriptions  Medication Sig Dispense Refill  . aspirin EC 325 MG EC tablet Take 1 tablet (325 mg total) by mouth daily. 30 tablet 0  .  metoprolol tartrate (LOPRESSOR) 25 MG tablet Take 0.5 tablets (12.5 mg total) by mouth 2 (two) times daily. 30 tablet 11  . pravastatin (PRAVACHOL) 80 MG tablet Take 80 mg by mouth daily.    . Vitamin D, Ergocalciferol, (DRISDOL) 50000 UNITS CAPS capsule Take 50,000 Units by mouth every 14 (fourteen) days.     No current facility-administered medications for this visit.    Allergies:   Review of patient's allergies indicates no known allergies.     Social History:  The patient  reports that he has never smoked. He has never used smokeless tobacco. He reports that he does not drink alcohol or use illicit drugs.   Family History:  The patient's family history includes Healthy in his brother; Heart attack in his mother; Heart disease in his father and mother. There is no history of Stroke.    ROS:  Please see the history of present illness.   Otherwise, review of systems are positive for none.   All other systems are reviewed and negative.    PHYSICAL EXAM: VS:  BP 110/80 mmHg  Pulse 62  Ht  (1.676 m)  Wt 150 lb (68.04 kg)  BMI 24.22 kg/m2 , BMI Body mass index is 24.22 kg/(m^2). GEN: Well nourished, well developed, in no acute distress HEENT: normal Neck: no JVD, carotid bruits, or masses Cardiac: RRR; no murmurs, rubs, or gallops,no edema  Respiratory:  clear to auscultation bilaterally, normal work of breathing GI: soft, nontender, nondistended, + BS MS: no deformity or atrophy Skin: warm and dry, no rash Neuro:  Strength and sensation are intact Psych: euthymic mood, full affect   EKG:  EKG is ordered today. The ekg ordered today demonstrates normal sinus rhythm.  Old anteroseptal infarct.  Since previous tracing of 11/09/14, diffuse T-wave abnormalities have improved further   Recent Labs: 10/07/2014: B Natriuretic Peptide 426.5*; TSH 3.043 10/09/2014: Magnesium 2.0 11/09/2014: BUN 22; Creatinine 1.35; Hemoglobin 9.7*; Platelets 522.0*; Potassium 4.3; Sodium 132* 11/16/2014: ALT 41    Lipid Panel    Component Value Date/Time   CHOL 143 10/08/2014 0528   TRIG 88 10/08/2014 0528   HDL 36* 10/08/2014 0528   CHOLHDL 4.0 10/08/2014 0528   VLDL 18 10/08/2014 0528   LDLCALC 89 10/08/2014 0528      Wt Readings from Last 3 Encounters:  12/10/14 150 lb (68.04 kg)  11/16/14 147 lb (66.679 kg)  11/12/14 144 lb (65.318 kg)         ASSESSMENT AND PLAN:  Hypotension, unspecified hypotension type Resolved.    Coronary artery disease S/P CABG (coronary artery bypass graft) on 10/08/2014 Continues to progress well since his CABG. Continue ASA, statin, beta-blocker. He will start cardiac rehab in June.  Ischemic cardiomyopathy  Recent Echo demonstrated improved LVF with EF 35-40%. Continue beta-blocker. His blood pressure is still too low to and ACE inhibitor.    Essential hypertension Controlled.   Hyperlipidemia Continue statin. Managed by PCP.    urrent medicines are reviewed at length with the patient today.  The patient does not have concerns regarding medicines.  The following changes have been made:  no change  Labs/ tests ordered today include:   Orders Placed This Encounter  Procedures  . EKG 12-Lead  . ECHOCARDIOGRAM COMPLETE     Disposition: Continue current medication recheck in 2 months for office visit and EKG and he will get an echocardiogram a week ahead of time to follow his ejection fraction. He would like to take a trip  to [place] and this will be okay. He would like to start bowling again but I think he should wait on this until after the cardiac rehabilitation has finished.  Karie Schwalbe MD 12/10/2014 1:07 PM    [facility] [address] [place], [place], [place]  [address] Phone: [phone]; Fax: [phone]

## 2014-12-17 ENCOUNTER — Ambulatory Visit (HOSPITAL_COMMUNITY): Payer: Medicare Other

## 2014-12-23 ENCOUNTER — Encounter (HOSPITAL_COMMUNITY): Payer: Medicare Other

## 2014-12-23 ENCOUNTER — Encounter: Payer: Self-pay | Admitting: Cardiology

## 2014-12-23 ENCOUNTER — Ambulatory Visit (INDEPENDENT_AMBULATORY_CARE_PROVIDER_SITE_OTHER): Payer: Medicare Other | Admitting: Cardiology

## 2014-12-23 VITALS — BP 126/86 | HR 83 | Ht 66.0 in | Wt 146.0 lb

## 2014-12-23 DIAGNOSIS — Z4802 Encounter for removal of sutures: Secondary | ICD-10-CM | POA: Diagnosis not present

## 2014-12-23 NOTE — Progress Notes (Signed)
Cardiology Office Note   Date:  12/23/2014   ID:  LOOMIS ANACKER, DOB 25-Mar-1946, MRN [id number]  PCP:  Junious Silk, MD  Cardiologist: Cassell Clement MD  No chief complaint on file.     History of Present Illness: Anthony Vasquez is a 69 y.o. male who presents for a work in follow-up office visit. Anthony Vasquez is a 69 y.o. male with a hx of HTN, HL, CKD. He was admitted 3/16-3/25 with a NSTEMI. He was taken for LHC that demonstrated EF 15-20%, severe 3 v CAD and LM involvement. He developed chest pain and hypotension during the procedure (cardiogenic shock) and an Impella device was placed. He was taken for emergent CABG by Dr. Donata Clay (L-LAD, S-RI, S-OM). Post op course was notable for ABL anemia but he did not require PRBCs. He remained in NSR. He has had a post op L effusion.   The patient was seen on 11/09/14 in office follow-up. He developed vasovagal syncope in the office. BP was in the 80s. We gave him IVFs and held his ACE inhibitor. Since stopping the ACE inhibitor he has had no further episodes of dizziness or syncope. His blood pressure at home rarely goes above 110 systolic. The patient had an echocardiogram 11/12/14 which demonstrated: - Mild LVH. EF 35% to 40%. Mid to apical anteroseptal akinesis, apical inferior akinesis, apical anterior akinesis, akinesis of the true apex. Grade 1 diastolic dysfunction  - Aortic valve: There was no stenosis. - Mitral valve: There was trivial regurgitation. - Right ventricle: The cavity size was normal. Systolic function was mildly reduced. - Pulmonary arteries: No complete TR doppler jet so unable to estimate PA systolic pressure. Impressions: Normal LV size with mild LV hypertrophy. EF 35-40%. Wall motion abnormalities as noted above. Normal RV size with mildly reduced systolic function. No significant valvular abnormalities.  The patient has not been experiencing any recurrent chest pain or angina. He  is scheduled to start cardiac rehabilitation in early June. His CABG was on March 17. He has now been walking up to a mile a day. The patient comes in today because of concern over a suture that he found in his left groin.  He noted incidentally.  There was no pain involved.  No evidence of inflammation etc.  No swelling.   Past Medical History  Diagnosis Date  . Hypertension   . Renal disorder   . Myocardial infarction 09/2014    nstemi  . Ischemic cardiomyopathy     Echo 4/16: Mild LVH, EF 35-40%, anteroseptal, apical, inferior, apical anterior anteroapical akinesis, grade 1 diastolic dysfunction, mildly reduced RVSF    Past Surgical History  Procedure Laterality Date  . Tonsillectomy    . Colonoscopy  2015  . Left heart catheterization with coronary angiogram N/A 10/08/2014    Procedure: LEFT HEART CATHETERIZATION WITH CORONARY ANGIOGRAM;  Surgeon: Corky Crafts, MD;  Location: [facility] CATH LAB;  Service: Cardiovascular;  Laterality: N/A;  . Coronary artery bypass graft N/A 10/08/2014    Procedure: CORONARY ARTERY BYPASS GRAFTING (CABG);  Surgeon: Kerin Perna, MD;  Location: [facility] OR;  Service: Open Heart Surgery;  Laterality: N/A;  PATIENT IS GETTING AN IMPELLA IN THE CATH LAB CABG times 3 using left internal mammary artery and endoscopically harvested left saphenous vein.  Marland Kitchen Tee without cardioversion N/A 10/08/2014    Procedure: TRANSESOPHAGEAL ECHOCARDIOGRAM (TEE);  Surgeon: Kerin Perna, MD;  Location: [facility] OR;  Service: Open Heart Surgery;  Laterality: N/A;  Current Outpatient Prescriptions  Medication Sig Dispense Refill  . aspirin EC 325 MG EC tablet Take 1 tablet (325 mg total) by mouth daily. 30 tablet 0  . metoprolol tartrate (LOPRESSOR) 25 MG tablet Take 0.5 tablets (12.5 mg total) by mouth 2 (two) times daily. 30 tablet 11  . pravastatin (PRAVACHOL) 80 MG tablet Take 80 mg by mouth daily.    . Vitamin D, Ergocalciferol, (DRISDOL) 50000 UNITS CAPS capsule Take  50,000 Units by mouth every 14 (fourteen) days.     No current facility-administered medications for this visit.    Allergies:   Review of patient's allergies indicates no known allergies.    Social History:  The patient  reports that he has never smoked. He has never used smokeless tobacco. He reports that he does not drink alcohol or use illicit drugs.   Family History:  The patient's family history includes Healthy in his brother; Heart attack in his mother; Heart disease in his father and mother. There is no history of Stroke.    ROS:  Please see the history of present illness.   Otherwise, review of systems are positive for none.   All other systems are reviewed and negative.    PHYSICAL EXAM: VS:  BP 126/86 mmHg  Pulse 83  Ht 5\' 6"  (1.676 m)  Wt 146 lb (66.225 kg)  BMI 23.58 kg/m2 , BMI Body mass index is 23.58 kg/(m^2). Examination today was limited to the left groin.  The patient has a suture in the left groin.  No tenderness or swelling.  EKG:  EKG is not ordered today.   Recent Labs: 10/07/2014: B Natriuretic Peptide 426.5*; TSH 3.043 10/09/2014: Magnesium 2.0 11/09/2014: BUN 22; Creatinine 1.35; Hemoglobin 9.7*; Platelets 522.0*; Potassium 4.3; Sodium 132* 11/16/2014: ALT 41    Lipid Panel    Component Value Date/Time   CHOL 143 10/08/2014 0528   TRIG 88 10/08/2014 0528   HDL 36* 10/08/2014 0528   CHOLHDL 4.0 10/08/2014 0528   VLDL 18 10/08/2014 0528   LDLCALC 89 10/08/2014 0528      Wt Readings from Last 3 Encounters:  12/23/14 146 lb (66.225 kg)  12/10/14 150 lb (68.04 kg)  11/16/14 147 lb (66.679 kg)        ASSESSMENT AND PLAN:   Expand All Collapse All      Cardiology Office Note   Date: 12/10/2014   ID: SLAYDE DOFFLEMYER, DOB August 30, 1945, MRN [id number]  PCP: Junious Silk, MD Cardiologist: Cassell Clement MD  No chief complaint on file.    History of Present Illness: JOURNEE LICO is a 69 y.o. male who presents for  scheduled follow-up office visit  TOUSSAINT SKRINE is a 69 y.o. male with a hx of HTN, HL, CKD. He was admitted 3/16-3/25 with a NSTEMI. He was taken for LHC that demonstrated EF 15-20%, severe 3 v CAD and LM involvement. He developed chest pain and hypotension during the procedure (cardiogenic shock) and an Impella device was placed. He was taken for emergent CABG by Dr. Donata Clay (L-LAD, S-RI, S-OM). Post op course was notable for ABL anemia but he did not require PRBCs. He remained in NSR. He has had a post op L effusion.   The patient was seen on 11/09/14 in office follow-up. He developed vasovagal syncope in the office. BP was in the 80s. We gave him IVFs and held his ACE inhibitor. Since stopping the ACE inhibitor he has had no further episodes of dizziness or syncope. His  blood pressure at home rarely goes above 110 systolic. The patient had an echocardiogram 11/12/14 which demonstrated: - Mild LVH. EF 35% to 40%. Mid to apical anteroseptal akinesis, apical inferior akinesis, apical anterior akinesis, akinesis of the true apex. Grade 1 diastolic dysfunction  - Aortic valve: There was no stenosis. - Mitral valve: There was trivial regurgitation. - Right ventricle: The cavity size was normal. Systolic function was mildly reduced. - Pulmonary arteries: No complete TR doppler jet so unable to estimate PA systolic pressure. Impressions: Normal LV size with mild LV hypertrophy. EF 35-40%. Wall motion abnormalities as noted above. Normal RV size with mildly reduced systolic function. No significant valvular abnormalities.  The patient has not been experiencing any recurrent chest pain or angina. He is scheduled to start cardiac rehabilitation in early June. His CABG was on March 17. He has now been walking up to a mile a day.  Past Medical History  Diagnosis Date  . Hypertension   . Renal disorder   . Myocardial infarction 09/2014    nstemi  . Ischemic  cardiomyopathy     Echo 4/16: Mild LVH, EF 35-40%, anteroseptal, apical, inferior, apical anterior anteroapical akinesis, grade 1 diastolic dysfunction, mildly reduced RVSF    Past Surgical History  Procedure Laterality Date  . Tonsillectomy    . Colonoscopy  2015  . Left heart catheterization with coronary angiogram N/A 10/08/2014    Procedure: LEFT HEART CATHETERIZATION WITH CORONARY ANGIOGRAM; Surgeon: Corky Crafts, MD; Location: [facility] CATH LAB; Service: Cardiovascular; Laterality: N/A;  . Coronary artery bypass graft N/A 10/08/2014    Procedure: CORONARY ARTERY BYPASS GRAFTING (CABG); Surgeon: Kerin Perna, MD; Location: [facility] OR; Service: Open Heart Surgery; Laterality: N/A; PATIENT IS GETTING AN IMPELLA IN THE CATH LAB CABG times 3 using left internal mammary artery and endoscopically harvested left saphenous vein.  Marland Kitchen Tee without cardioversion N/A 10/08/2014    Procedure: TRANSESOPHAGEAL ECHOCARDIOGRAM (TEE); Surgeon: Kerin Perna, MD; Location: [facility] OR; Service: Open Heart Surgery; Laterality: N/A;     Current Outpatient Prescriptions  Medication Sig Dispense Refill  . aspirin EC 325 MG EC tablet Take 1 tablet (325 mg total) by mouth daily. 30 tablet 0  . metoprolol tartrate (LOPRESSOR) 25 MG tablet Take 0.5 tablets (12.5 mg total) by mouth 2 (two) times daily. 30 tablet 11  . pravastatin (PRAVACHOL) 80 MG tablet Take 80 mg by mouth daily.    . Vitamin D, Ergocalciferol, (DRISDOL) 50000 UNITS CAPS capsule Take 50,000 Units by mouth every 14 (fourteen) days.     No current facility-administered medications for this visit.    Allergies: Review of patient's allergies indicates no known allergies.    Social History: The patient  reports that he has never smoked. He has never used smokeless tobacco. He reports that he does not drink alcohol or use illicit drugs.   Family History: The patient's  family history includes Healthy in his brother; Heart attack in his mother; Heart disease in his father and mother. There is no history of Stroke.    ROS: Please see the history of present illness. Otherwise, review of systems are positive for none. All other systems are reviewed and negative.    PHYSICAL EXAM: VS: BP 110/80 mmHg  Pulse 62  Ht 5\' 6"  (1.676 m)  Wt 150 lb (68.04 kg)  BMI 24.22 kg/m2 , BMI Body mass index is 24.22 kg/(m^2). GEN: Well nourished, well developed, in no acute distress  HEENT: normal  Neck: no JVD, carotid bruits,  or masses Cardiac: RRR; no murmurs, rubs, or gallops,no edema  Respiratory: clear to auscultation bilaterally, normal work of breathing GI: soft, nontender, nondistended, + BS MS: no deformity or atrophy  Skin: warm and dry, no rash Neuro: Strength and sensation are intact Psych: euthymic mood, full affect   EKG: EKG is ordered today. The ekg ordered today demonstrates normal sinus rhythm. Old anteroseptal infarct. Since previous tracing of 11/09/14, diffuse T-wave abnormalities have improved further   Recent Labs: 10/07/2014: B Natriuretic Peptide 426.5*; TSH 3.043 10/09/2014: Magnesium 2.0 11/09/2014: BUN 22; Creatinine 1.35; Hemoglobin 9.7*; Platelets 522.0*; Potassium 4.3; Sodium 132* 11/16/2014: ALT 41    Lipid Panel  Labs (Brief)       Component Value Date/Time   CHOL 143 10/08/2014 0528   TRIG 88 10/08/2014 0528   HDL 36* 10/08/2014 0528   CHOLHDL 4.0 10/08/2014 0528   VLDL 18 10/08/2014 0528   LDLCALC 89 10/08/2014 0528       Wt Readings from Last 3 Encounters:  12/10/14 150 lb (68.04 kg)  11/16/14 147 lb (66.679 kg)  11/12/14 144 lb (65.318 kg)         ASSESSMENT AND PLAN:    Coronary artery disease S/P CABG (coronary artery bypass graft) on 10/08/2014 Continues to progress well since his CABG. Continue ASA, statin, beta-blocker. He will start cardiac rehab in  June.  Ischemic cardiomyopathy  Recent Echo demonstrated improved LVF with EF 35-40%. Continue beta-blocker. His blood pressure is still too low to and ACE inhibitor.   Essential hypertension Controlled.   Hyperlipidemia Continue statin. Managed by PCP      Retained suture left groin Using forceps and scissors, groin suture was removed without difficulty.  Current medicines are reviewed at length with the patient today.  The patient does not have concerns regarding medicines.  The following changes have been made:  no change  Labs/ tests ordered today include:  No orders of the defined types were placed in this encounter.     Disposition: He will apply Polysporin or Neosporin to the groin for the next several days.  Continue other medicines unchanged.  Keep July office visit as scheduled.  Karie Schwalbe MD 12/23/2014 4:54 PM    [facility] [address] [place], [place], [place]  [address] Phone: [phone]; Fax: [phone]

## 2014-12-23 NOTE — Patient Instructions (Signed)
Medication Instructions:  Your physician recommends that you continue on your current medications as directed. Please refer to the Current Medication list given to you today.  Labwork: none  Testing/Procedures: none  Follow-Up: Keep appointment in July as scheduled   Any Other Special Instructions Will Be Listed Below (If Applicable). Apply Polysporin to area for the next few days

## 2014-12-24 ENCOUNTER — Encounter (HOSPITAL_COMMUNITY)
Admission: RE | Admit: 2014-12-24 | Discharge: 2014-12-24 | Disposition: A | Discharge status: Home / Self Care | Payer: Medicare Other | Source: Ambulatory Visit | Attending: Cardiology | Admitting: Cardiology

## 2014-12-24 DIAGNOSIS — Z951 Presence of aortocoronary bypass graft: Secondary | ICD-10-CM | POA: Insufficient documentation

## 2014-12-24 DIAGNOSIS — Z48812 Encounter for surgical aftercare following surgery on the circulatory system: Secondary | ICD-10-CM | POA: Insufficient documentation

## 2014-12-24 DIAGNOSIS — I2510 Atherosclerotic heart disease of native coronary artery without angina pectoris: Secondary | ICD-10-CM | POA: Insufficient documentation

## 2014-12-24 NOTE — Progress Notes (Signed)
Cardiac Rehab Medication Review by a Pharmacist  Does the patient  feel that his/her medications are working for him/her?  yes  Has the patient been experiencing any side effects to the medications prescribed?  No  Does the patient measure his/her own blood pressure or blood glucose at home?  Yes. BP usually runs 110, mid 70s.  Does the patient have any problems obtaining medications due to transportation or finances?   no  Understanding of regimen: good Understanding of indications: good Potential of compliance: good    Pharmacist comments: Pt reports compliance with all of their medications. No concerns or issues reported.  Armandina Stammer 12/24/2014 8:39 AM

## 2014-12-25 ENCOUNTER — Encounter (HOSPITAL_COMMUNITY): Payer: Medicare Other

## 2014-12-28 ENCOUNTER — Encounter (HOSPITAL_COMMUNITY)
Admission: RE | Admit: 2014-12-28 | Discharge: 2014-12-28 | Disposition: A | Discharge status: Home / Self Care | Payer: Medicare Other | Source: Ambulatory Visit | Attending: Cardiology | Admitting: Cardiology

## 2014-12-28 DIAGNOSIS — I2510 Atherosclerotic heart disease of native coronary artery without angina pectoris: Secondary | ICD-10-CM | POA: Diagnosis not present

## 2014-12-28 DIAGNOSIS — Z951 Presence of aortocoronary bypass graft: Secondary | ICD-10-CM | POA: Diagnosis not present

## 2014-12-28 DIAGNOSIS — Z48812 Encounter for surgical aftercare following surgery on the circulatory system: Secondary | ICD-10-CM | POA: Diagnosis present

## 2014-12-28 NOTE — Progress Notes (Signed)
Pt started cardiac rehab today in the 6:45 exercise class.  Pt tolerated light exercise without difficulty. VSS, telemetry-SR with no noted ecotpy, asymptomatic.  Medication list reconciled.  Pt verbalized compliance with medications and denies barriers to compliance. PSYCHOSOCIAL ASSESSMENT:  PHQ-0. Pt exhibits positive coping skills, hopeful outlook with supportive family. Pt also has support of his pastor. No psychosocial needs identified at this time, no psychosocial interventions necessary.    Pt enjoys working on his boat and spending time riding the boat.   Pt cardiac rehab  goal is  to increase endurance and flexibility.  Pt encouraged to participate in home exercise and education class particular the core strengthening class that is taught. This will  increase his ability to achieve this goals.   Pt long term cardiac rehab goal is Maintain the effects from cardiac rehab. This will be measured by MET level progression and periodic check in for consistency in exercising on off days from rehab.  Pt enjoys walking and averages 1-2 miles a day.  Pt oriented to exercise equipment and routine.  Understanding verbalized. Cherre Huger, BSN

## 2014-12-30 ENCOUNTER — Encounter (HOSPITAL_COMMUNITY)
Admission: RE | Admit: 2014-12-30 | Discharge: 2014-12-30 | Disposition: A | Discharge status: Home / Self Care | Payer: Medicare Other | Source: Ambulatory Visit | Attending: Cardiology | Admitting: Cardiology

## 2014-12-30 ENCOUNTER — Encounter: Payer: Self-pay | Admitting: Cardiothoracic Surgery

## 2014-12-30 ENCOUNTER — Ambulatory Visit (INDEPENDENT_AMBULATORY_CARE_PROVIDER_SITE_OTHER): Payer: Self-pay | Admitting: Cardiothoracic Surgery

## 2014-12-30 VITALS — BP 103/63 | HR 70 | Resp 16 | Ht 66.0 in | Wt 141.0 lb

## 2014-12-30 DIAGNOSIS — I2510 Atherosclerotic heart disease of native coronary artery without angina pectoris: Secondary | ICD-10-CM

## 2014-12-30 DIAGNOSIS — Z48812 Encounter for surgical aftercare following surgery on the circulatory system: Secondary | ICD-10-CM | POA: Diagnosis not present

## 2014-12-30 DIAGNOSIS — Z951 Presence of aortocoronary bypass graft: Secondary | ICD-10-CM

## 2014-12-30 NOTE — Progress Notes (Signed)
PCP is Junious Silk, MD Referring Provider is Corky Crafts, MD  Chief Complaint  Patient presents with  . Routine Post Op    6 week f/u s/p CABG X 3 10/08/14    WUJ:[id number] 3 month followup after emergency CABG x3 after ST elevation MI. Patient has almost fully recovered. He is walking 1.5 miles daily and has entered cardiac rehabilitation. Surgical incisions are healing well. Most recent chest x-ray and echocardiogram images personally reviewed. LV ejection fraction approximately 45%. No pleural effusion on chest x-ray. Sternal wires intact.  The patient is cleared for all reasonable activity at this point. He could return to work after his cardiac rehabilitation if  he wishes.   Past Medical History  Diagnosis Date  . Hypertension   . Renal disorder   . Myocardial infarction 09/2014    nstemi  . Ischemic cardiomyopathy     Echo 4/16: Mild LVH, EF 35-40%, anteroseptal, apical, inferior, apical anterior anteroapical akinesis, grade 1 diastolic dysfunction, mildly reduced RVSF    Past Surgical History  Procedure Laterality Date  . Tonsillectomy    . Colonoscopy  2015  . Left heart catheterization with coronary angiogram N/A 10/08/2014    Procedure: LEFT HEART CATHETERIZATION WITH CORONARY ANGIOGRAM;  Surgeon: Corky Crafts, MD;  Location: [facility] CATH LAB;  Service: Cardiovascular;  Laterality: N/A;  . Coronary artery bypass graft N/A 10/08/2014    Procedure: CORONARY ARTERY BYPASS GRAFTING (CABG);  Surgeon: Kerin Perna, MD;  Location: [facility] OR;  Service: Open Heart Surgery;  Laterality: N/A;  PATIENT IS GETTING AN IMPELLA IN THE CATH LAB CABG times 3 using left internal mammary artery and endoscopically harvested left saphenous vein.  Marland Kitchen Tee without cardioversion N/A 10/08/2014    Procedure: TRANSESOPHAGEAL ECHOCARDIOGRAM (TEE);  Surgeon: Kerin Perna, MD;  Location: [facility] OR;  Service: Open Heart Surgery;  Laterality: N/A;    Family History  Problem Relation Age of  Onset  . Heart disease Mother   . Heart attack Mother   . Heart disease Father   . Healthy Brother   . Stroke Neg Hx     Social History History  Substance Use Topics  . Smoking status: Never Smoker   . Smokeless tobacco: Never Used  . Alcohol Use: No    Current Outpatient Prescriptions  Medication Sig Dispense Refill  . aspirin EC 325 MG EC tablet Take 1 tablet (325 mg total) by mouth daily. 30 tablet 0  . metoprolol tartrate (LOPRESSOR) 25 MG tablet Take 0.5 tablets (12.5 mg total) by mouth 2 (two) times daily. 30 tablet 11  . pravastatin (PRAVACHOL) 80 MG tablet Take 80 mg by mouth daily.    . Vitamin D, Ergocalciferol, (DRISDOL) 50000 UNITS CAPS capsule Take 50,000 Units by mouth every 14 (fourteen) days.     No current facility-administered medications for this visit.    No Known Allergies  Review of Systems  No angina No fever No ankle edema No sternal clicking Excellent progress with improved exercise tolerance  BP 103/63 mmHg  Pulse 70  Resp 16  Ht  (1.676 m)  Wt 141 lb (63.957 kg)  BMI 22.77 kg/m2  SpO2 98% Physical Exam Alert and comfortable Lungs clear Heart rhythm regular without murmur Sternal incision stable well-healed Leg incision is well-healed No ankle edema, good pedal pulses  Diagnostic Tests: Echocardiogram and chest x-ray personally reviewed.  Impression: Functional class I now after 3 months following emergency CABG. Mild LV dysfunction on echocardiogram. He has no  restrictions to his activity at the current time.  Plan: Return as needed.  Mikey Bussing, MD [facility] [phone]

## 2015-01-01 ENCOUNTER — Encounter (HOSPITAL_COMMUNITY)
Admission: RE | Admit: 2015-01-01 | Discharge: 2015-01-01 | Disposition: A | Discharge status: Home / Self Care | Payer: Medicare Other | Source: Ambulatory Visit | Attending: Cardiology | Admitting: Cardiology

## 2015-01-01 DIAGNOSIS — Z48812 Encounter for surgical aftercare following surgery on the circulatory system: Secondary | ICD-10-CM | POA: Diagnosis not present

## 2015-01-01 NOTE — Progress Notes (Signed)
QUALITY OF LIFE SCORE REVIEW  Pt completed Quality of Life Survey as a participant in Cardiac rehab.  Pt scores were within normal limits.  Quality of life results are as follows: Overall 27.30, Healthy and Function 27.13, Socioeconomic 26.70, Psychological and Spiritual 27.50 and Family 28.50.  Scores less than 21 are considered low.  No needs identified.  No further intervention is warranted at this time.  Will continue to monitor and intervene as necessary.  Alanson Aly, BSN

## 2015-01-04 ENCOUNTER — Encounter (HOSPITAL_COMMUNITY)
Admission: RE | Admit: 2015-01-04 | Discharge: 2015-01-04 | Disposition: A | Discharge status: Home / Self Care | Payer: Medicare Other | Source: Ambulatory Visit | Attending: Cardiology | Admitting: Cardiology

## 2015-01-04 DIAGNOSIS — Z48812 Encounter for surgical aftercare following surgery on the circulatory system: Secondary | ICD-10-CM | POA: Diagnosis not present

## 2015-01-06 ENCOUNTER — Encounter (HOSPITAL_COMMUNITY)
Admission: RE | Admit: 2015-01-06 | Discharge: 2015-01-06 | Disposition: A | Discharge status: Home / Self Care | Payer: Medicare Other | Source: Ambulatory Visit | Attending: Cardiology | Admitting: Cardiology

## 2015-01-06 DIAGNOSIS — Z48812 Encounter for surgical aftercare following surgery on the circulatory system: Secondary | ICD-10-CM | POA: Diagnosis not present

## 2015-01-08 ENCOUNTER — Encounter (HOSPITAL_COMMUNITY)
Admission: RE | Admit: 2015-01-08 | Discharge: 2015-01-08 | Disposition: A | Discharge status: Home / Self Care | Payer: Medicare Other | Source: Ambulatory Visit | Attending: Cardiology | Admitting: Cardiology

## 2015-01-08 DIAGNOSIS — Z48812 Encounter for surgical aftercare following surgery on the circulatory system: Secondary | ICD-10-CM | POA: Diagnosis not present

## 2015-01-11 ENCOUNTER — Encounter (HOSPITAL_COMMUNITY)
Admission: RE | Admit: 2015-01-11 | Discharge: 2015-01-11 | Disposition: A | Discharge status: Home / Self Care | Payer: Medicare Other | Source: Ambulatory Visit | Attending: Cardiology | Admitting: Cardiology

## 2015-01-11 DIAGNOSIS — Z48812 Encounter for surgical aftercare following surgery on the circulatory system: Secondary | ICD-10-CM | POA: Diagnosis not present

## 2015-01-13 ENCOUNTER — Encounter (HOSPITAL_COMMUNITY)
Admission: RE | Admit: 2015-01-13 | Discharge: 2015-01-13 | Disposition: A | Discharge status: Home / Self Care | Payer: Medicare Other | Source: Ambulatory Visit | Attending: Cardiology | Admitting: Cardiology

## 2015-01-13 DIAGNOSIS — Z48812 Encounter for surgical aftercare following surgery on the circulatory system: Secondary | ICD-10-CM | POA: Diagnosis not present

## 2015-01-15 ENCOUNTER — Encounter (HOSPITAL_COMMUNITY)
Admission: RE | Admit: 2015-01-15 | Discharge: 2015-01-15 | Disposition: A | Discharge status: Home / Self Care | Payer: Medicare Other | Source: Ambulatory Visit | Attending: Cardiology | Admitting: Cardiology

## 2015-01-15 DIAGNOSIS — Z48812 Encounter for surgical aftercare following surgery on the circulatory system: Secondary | ICD-10-CM | POA: Diagnosis not present

## 2015-01-18 ENCOUNTER — Encounter (HOSPITAL_COMMUNITY)
Admission: RE | Admit: 2015-01-18 | Discharge: 2015-01-18 | Disposition: A | Discharge status: Home / Self Care | Payer: Medicare Other | Source: Ambulatory Visit | Attending: Cardiology | Admitting: Cardiology

## 2015-01-18 DIAGNOSIS — Z48812 Encounter for surgical aftercare following surgery on the circulatory system: Secondary | ICD-10-CM | POA: Diagnosis not present

## 2015-01-20 ENCOUNTER — Encounter (HOSPITAL_COMMUNITY)
Admission: RE | Admit: 2015-01-20 | Discharge: 2015-01-20 | Disposition: A | Discharge status: Home / Self Care | Payer: Medicare Other | Source: Ambulatory Visit | Attending: Cardiology | Admitting: Cardiology

## 2015-01-20 DIAGNOSIS — Z48812 Encounter for surgical aftercare following surgery on the circulatory system: Secondary | ICD-10-CM | POA: Diagnosis not present

## 2015-01-20 NOTE — Progress Notes (Signed)
Anthony Vasquez 69 y.o. male Nutrition Note Spoke with pt.  Nutrition Survey reviewed with pt. Pt is following Step 2 of the Therapeutic Lifestyle Changes diet. Pt reports his UBW prior to surgery was around 165 lb. Pt wt today 149.6 lb (68 kg), which is down 21.3 lb over the past 3 months. Pt wants to maintain wt loss. Pt is drinking Boost daily "because my doctor told me I needed to drink it." Pt reports his appetite has increased and pt wt has been stable. Pt is pre-diabetic. Per discussion, pt said "I've been pre-diabetic for years." Pre-diabetes and diet discussed. Pt expressed understanding of the information reviewed. Pt aware of nutrition education classes offered. Lab Results  Component Value Date   HGBA1C 6.0* 10/09/2014   Vitals - 1 value per visit 12/30/2014 12/24/2014 12/23/2014 12/10/2014 11/16/2014  Weight (lb) 141 145.72 146 150 147   Vitals - 1 value per visit 11/12/2014 11/09/2014 10/30/2014 10/16/2014  Weight (lb) 144 144 150 170.86   Nutrition Diagnosis ? Food-and nutrition-related knowledge deficit related to lack of exposure to information as related to diagnosis of: ? CVD ? Pre-DM  Nutrition Intervention ? Benefits of adopting Therapeutic Lifestyle Changes discussed when Medficts reviewed. ? Pt to attend the Portion Distortion class ? Pt to attend the  ? Nutrition I class - met; 01/19/15 ?                      ? Nutrition II class - met; 01/05/15 ? Continue client-centered nutrition education by RD, as part of interdisciplinary care.  Goal(s) ? Pt to describe the benefit of including fruits, vegetables, whole grains, and low-fat dairy products in a heart healthy meal plan.  Monitor and Evaluate progress toward nutrition goal with team.  Derek Mound, M.Ed, RD, LDN, CDE 01/20/2015 7:46 AM

## 2015-01-20 NOTE — Progress Notes (Signed)
Reviewed home exercise with pt today.  Pt plans to walk outdoors and on treadmill for exercise. Pt will exercise 3-4 days in addition to CRPII. Reviewed THR, pulse, RPE, sign and symptoms, and when to call 911 or MD.  Pt voiced understanding.     Anthony Vasquez [employer]

## 2015-01-22 ENCOUNTER — Encounter (HOSPITAL_COMMUNITY)
Admission: RE | Admit: 2015-01-22 | Discharge: 2015-01-22 | Disposition: A | Discharge status: Home / Self Care | Payer: Medicare Other | Source: Ambulatory Visit | Attending: Cardiology | Admitting: Cardiology

## 2015-01-22 DIAGNOSIS — Z48812 Encounter for surgical aftercare following surgery on the circulatory system: Secondary | ICD-10-CM | POA: Diagnosis not present

## 2015-01-22 DIAGNOSIS — Z951 Presence of aortocoronary bypass graft: Secondary | ICD-10-CM | POA: Diagnosis not present

## 2015-01-22 DIAGNOSIS — I2510 Atherosclerotic heart disease of native coronary artery without angina pectoris: Secondary | ICD-10-CM | POA: Insufficient documentation

## 2015-01-27 ENCOUNTER — Encounter (HOSPITAL_COMMUNITY)
Admission: RE | Admit: 2015-01-27 | Discharge: 2015-01-27 | Disposition: A | Discharge status: Home / Self Care | Payer: Medicare Other | Source: Ambulatory Visit | Attending: Cardiology | Admitting: Cardiology

## 2015-01-27 DIAGNOSIS — Z48812 Encounter for surgical aftercare following surgery on the circulatory system: Secondary | ICD-10-CM | POA: Diagnosis not present

## 2015-01-27 NOTE — Progress Notes (Signed)
  30 day Psychosocial followup assessment  Patient psychosocial assessment reveals no barriers to cardiac rehab participation.   Patient exhibits positive and healthy coping skills. Pt is quiet by nature and performs outlined tasks diligently.  Patient feels he is making progress towards cardiac rehab goals.  Patient reports health and activity level greatly improved in the past 30 days as evidenced by patient's reports of improved endurance and flexibility.   Patient reports feeling positive about current and projected progress toward cardiac rehab goals include continued attendance to exercise and education classes.  Patient's rate of progress towards goals is excellent.  Plan of action to help patient continue to work towards rehab goals include close supervision of home exercise particular the core stretching class.  Will continue to monitor and evaluate progress toward psychosocial goals.   Goals in progress: Help patient work toward returning to meaningful activities that improve patient's quality of life and are attainable. Alanson Aly, BSN

## 2015-01-29 ENCOUNTER — Encounter (HOSPITAL_COMMUNITY)
Admission: RE | Admit: 2015-01-29 | Discharge: 2015-01-29 | Disposition: A | Discharge status: Home / Self Care | Payer: Medicare Other | Source: Ambulatory Visit | Attending: Cardiology | Admitting: Cardiology

## 2015-01-29 DIAGNOSIS — Z48812 Encounter for surgical aftercare following surgery on the circulatory system: Secondary | ICD-10-CM | POA: Diagnosis not present

## 2015-02-01 ENCOUNTER — Encounter (HOSPITAL_COMMUNITY)
Admission: RE | Admit: 2015-02-01 | Discharge: 2015-02-01 | Disposition: A | Discharge status: Home / Self Care | Payer: Medicare Other | Source: Ambulatory Visit | Attending: Cardiology | Admitting: Cardiology

## 2015-02-01 DIAGNOSIS — Z48812 Encounter for surgical aftercare following surgery on the circulatory system: Secondary | ICD-10-CM | POA: Diagnosis not present

## 2015-02-03 ENCOUNTER — Encounter (HOSPITAL_COMMUNITY)
Admission: RE | Admit: 2015-02-03 | Discharge: 2015-02-03 | Disposition: A | Discharge status: Home / Self Care | Payer: Medicare Other | Source: Ambulatory Visit | Attending: Cardiology | Admitting: Cardiology

## 2015-02-03 DIAGNOSIS — Z48812 Encounter for surgical aftercare following surgery on the circulatory system: Secondary | ICD-10-CM | POA: Diagnosis not present

## 2015-02-05 ENCOUNTER — Encounter (HOSPITAL_COMMUNITY)
Admission: RE | Admit: 2015-02-05 | Discharge: 2015-02-05 | Disposition: A | Discharge status: Home / Self Care | Payer: Medicare Other | Source: Ambulatory Visit | Attending: Cardiology | Admitting: Cardiology

## 2015-02-05 DIAGNOSIS — Z48812 Encounter for surgical aftercare following surgery on the circulatory system: Secondary | ICD-10-CM | POA: Diagnosis not present

## 2015-02-08 ENCOUNTER — Encounter (HOSPITAL_COMMUNITY)
Admission: RE | Admit: 2015-02-08 | Discharge: 2015-02-08 | Disposition: A | Discharge status: Home / Self Care | Payer: Medicare Other | Source: Ambulatory Visit | Attending: Cardiology | Admitting: Cardiology

## 2015-02-08 DIAGNOSIS — Z48812 Encounter for surgical aftercare following surgery on the circulatory system: Secondary | ICD-10-CM | POA: Diagnosis not present

## 2015-02-10 ENCOUNTER — Ambulatory Visit (HOSPITAL_COMMUNITY): Payer: Medicare Other | Attending: Cardiovascular Disease

## 2015-02-10 ENCOUNTER — Encounter (HOSPITAL_COMMUNITY)
Admission: RE | Admit: 2015-02-10 | Discharge: 2015-02-10 | Disposition: A | Discharge status: Home / Self Care | Payer: Medicare Other | Source: Ambulatory Visit | Attending: Cardiology | Admitting: Cardiology

## 2015-02-10 ENCOUNTER — Other Ambulatory Visit: Payer: Self-pay

## 2015-02-10 DIAGNOSIS — I255 Ischemic cardiomyopathy: Secondary | ICD-10-CM

## 2015-02-10 DIAGNOSIS — Z951 Presence of aortocoronary bypass graft: Secondary | ICD-10-CM | POA: Diagnosis not present

## 2015-02-10 DIAGNOSIS — I10 Essential (primary) hypertension: Secondary | ICD-10-CM | POA: Insufficient documentation

## 2015-02-10 DIAGNOSIS — I2510 Atherosclerotic heart disease of native coronary artery without angina pectoris: Secondary | ICD-10-CM

## 2015-02-10 DIAGNOSIS — Z48812 Encounter for surgical aftercare following surgery on the circulatory system: Secondary | ICD-10-CM | POA: Diagnosis not present

## 2015-02-10 DIAGNOSIS — I517 Cardiomegaly: Secondary | ICD-10-CM | POA: Insufficient documentation

## 2015-02-10 DIAGNOSIS — Z8249 Family history of ischemic heart disease and other diseases of the circulatory system: Secondary | ICD-10-CM | POA: Diagnosis not present

## 2015-02-11 ENCOUNTER — Other Ambulatory Visit (HOSPITAL_COMMUNITY): Payer: Medicare Other

## 2015-02-11 ENCOUNTER — Telehealth: Payer: Self-pay | Admitting: *Deleted

## 2015-02-11 NOTE — Telephone Encounter (Signed)
-----   Message from Cassell Clement, MD sent at 02/10/2015  9:20 PM EDT ----- Please report.  The ejection fraction is slightly better, now up to 45%.  Continue same meds. Send copy to Dr. Leslie Dales

## 2015-02-11 NOTE — Telephone Encounter (Signed)
Advised patient of results.  

## 2015-02-12 ENCOUNTER — Encounter (HOSPITAL_COMMUNITY)
Admission: RE | Admit: 2015-02-12 | Discharge: 2015-02-12 | Disposition: A | Discharge status: Home / Self Care | Payer: Medicare Other | Source: Ambulatory Visit | Attending: Cardiology | Admitting: Cardiology

## 2015-02-12 DIAGNOSIS — Z48812 Encounter for surgical aftercare following surgery on the circulatory system: Secondary | ICD-10-CM | POA: Diagnosis not present

## 2015-02-15 ENCOUNTER — Encounter (HOSPITAL_COMMUNITY)
Admission: RE | Admit: 2015-02-15 | Discharge: 2015-02-15 | Disposition: A | Discharge status: Home / Self Care | Payer: Medicare Other | Source: Ambulatory Visit | Attending: Cardiology | Admitting: Cardiology

## 2015-02-15 ENCOUNTER — Encounter: Payer: Self-pay | Admitting: Cardiology

## 2015-02-15 DIAGNOSIS — Z48812 Encounter for surgical aftercare following surgery on the circulatory system: Secondary | ICD-10-CM | POA: Diagnosis not present

## 2015-02-17 ENCOUNTER — Ambulatory Visit (INDEPENDENT_AMBULATORY_CARE_PROVIDER_SITE_OTHER): Payer: Medicare Other | Admitting: Cardiology

## 2015-02-17 ENCOUNTER — Encounter: Payer: Self-pay | Admitting: Cardiology

## 2015-02-17 ENCOUNTER — Encounter (HOSPITAL_COMMUNITY)
Admission: RE | Admit: 2015-02-17 | Discharge: 2015-02-17 | Disposition: A | Discharge status: Home / Self Care | Payer: Medicare Other | Source: Ambulatory Visit | Attending: Cardiology | Admitting: Cardiology

## 2015-02-17 VITALS — BP 104/66 | HR 67 | Ht 66.0 in | Wt 151.8 lb

## 2015-02-17 DIAGNOSIS — Z951 Presence of aortocoronary bypass graft: Secondary | ICD-10-CM | POA: Diagnosis not present

## 2015-02-17 DIAGNOSIS — I959 Hypotension, unspecified: Secondary | ICD-10-CM

## 2015-02-17 DIAGNOSIS — I2510 Atherosclerotic heart disease of native coronary artery without angina pectoris: Secondary | ICD-10-CM

## 2015-02-17 DIAGNOSIS — Z48812 Encounter for surgical aftercare following surgery on the circulatory system: Secondary | ICD-10-CM | POA: Diagnosis not present

## 2015-02-17 NOTE — Progress Notes (Signed)
Cardiology Office Note   Date:  02/17/2015   ID:  Khiree, Newman 1946/02/19, MRN [id number]  PCP:  Junious Silk, MD  Cardiologist: Cassell Clement MD  No chief complaint on file.     History of Present Illness:  Anthony Vasquez is a 69 y.o. male who presents for scheduled follow-up office visit  Anthony Vasquez is a 69 y.o. male with a hx of HTN, HL, CKD. He was admitted 3/16-3/25 with a NSTEMI. He was taken for LHC that demonstrated EF 15-20%, severe 3 v CAD and LM involvement. He developed chest pain and hypotension during the procedure (cardiogenic shock) and an Impella device was placed. He was taken for emergent CABG by Dr. Donata Clay (L-LAD, S-RI, S-OM). Post op course was notable for ABL anemia but he did not require PRBCs. He remained in NSR. He has had a post op L effusion.   The patient was seen on 11/09/14 in office follow-up. He developed vasovagal syncope in the office. BP was in the 80s. We gave him IVFs and held his ACE inhibitor. Since stopping the ACE inhibitor he has had no further episodes of dizziness or syncope. His blood pressure at home rarely goes above 110 systolic. The patient had an echocardiogram 11/12/14 which demonstrated: - Mild LVH. EF 35% to 40%. Mid to apical anteroseptal akinesis, apical inferior akinesis, apical anterior akinesis, akinesis of the true apex. Grade 1 diastolic dysfunction  - Aortic valve: There was no stenosis. - Mitral valve: There was trivial regurgitation. - Right ventricle: The cavity size was normal. Systolic function was mildly reduced. - Pulmonary arteries: No complete TR doppler jet so unable to estimate PA systolic pressure. Impressions: Normal LV size with mild LV hypertrophy. EF 35-40%. Wall motion abnormalities as noted above. Normal RV size with mildly reduced systolic function. No significant valvular abnormalities.  The patient has not been experiencing any recurrent chest pain or angina.  He is scheduled to start cardiac rehabilitation in early June. His CABG was on March 17. He has now been walking up to two miles  a day. The patient had a follow-up echocardiogram 02/10/15 which showed that his ejection fraction had improved to 45%.   Past Medical History  Diagnosis Date  . Hypertension   . Renal disorder   . Myocardial infarction 09/2014    nstemi  . Ischemic cardiomyopathy     Echo 4/16: Mild LVH, EF 35-40%, anteroseptal, apical, inferior, apical anterior anteroapical akinesis, grade 1 diastolic dysfunction, mildly reduced RVSF    Past Surgical History  Procedure Laterality Date  . Tonsillectomy    . Colonoscopy  2015  . Left heart catheterization with coronary angiogram N/A 10/08/2014    Procedure: LEFT HEART CATHETERIZATION WITH CORONARY ANGIOGRAM;  Surgeon: Corky Crafts, MD;  Location: [facility] CATH LAB;  Service: Cardiovascular;  Laterality: N/A;  . Coronary artery bypass graft N/A 10/08/2014    Procedure: CORONARY ARTERY BYPASS GRAFTING (CABG);  Surgeon: Kerin Perna, MD;  Location: [facility] OR;  Service: Open Heart Surgery;  Laterality: N/A;  PATIENT IS GETTING AN IMPELLA IN THE CATH LAB CABG times 3 using left internal mammary artery and endoscopically harvested left saphenous vein.  Marland Kitchen Tee without cardioversion N/A 10/08/2014    Procedure: TRANSESOPHAGEAL ECHOCARDIOGRAM (TEE);  Surgeon: Kerin Perna, MD;  Location: [facility] OR;  Service: Open Heart Surgery;  Laterality: N/A;     Current Outpatient Prescriptions  Medication Sig Dispense Refill  . aspirin EC 325 MG EC  tablet Take 1 tablet (325 mg total) by mouth daily. 30 tablet 0  . metoprolol tartrate (LOPRESSOR) 25 MG tablet Take 0.5 tablets (12.5 mg total) by mouth 2 (two) times daily. 30 tablet 11  . pravastatin (PRAVACHOL) 80 MG tablet Take 80 mg by mouth daily.    . Vitamin D, Ergocalciferol, (DRISDOL) 50000 UNITS CAPS capsule Take 50,000 Units by mouth every 14 (fourteen) days.     No current  facility-administered medications for this visit.    Allergies:   Review of patient's allergies indicates no known allergies.    Social History:  The patient  reports that he has never smoked. He has never used smokeless tobacco. He reports that he does not drink alcohol or use illicit drugs.   Family History:  The patient's family history includes Healthy in his brother; Heart attack in his mother; Heart disease in his father and mother. There is no history of Stroke.    ROS:  Please see the history of present illness.   Otherwise, review of systems are positive for none.   All other systems are reviewed and negative.    PHYSICAL EXAM: VS:  BP 104/66 mmHg  Pulse 67  Ht 5\' 6"  (1.676 m)  Wt 151 lb 12.8 oz (68.856 kg)  BMI 24.51 kg/m2 , BMI Body mass index is 24.51 kg/(m^2). GEN: Well nourished, well developed, in no acute distress HEENT: normal Neck: no JVD, carotid bruits, or masses Cardiac: RRR; no murmurs, rubs, or gallops,no edema  Respiratory:  clear to auscultation bilaterally, normal work of breathing GI: soft, nontender, nondistended, + BS MS: no deformity or atrophy Skin: warm and dry, no rash Neuro:  Strength and sensation are intact Psych: euthymic mood, full affect   EKG:  EKG is not ordered today.    Recent Labs: 10/07/2014: B Natriuretic Peptide 426.5*; TSH 3.043 10/09/2014: Magnesium 2.0 11/09/2014: BUN 22; Creatinine, Ser 1.35; Hemoglobin 9.7*; Platelets 522.0*; Potassium 4.3; Sodium 132* 11/16/2014: ALT 41    Lipid Panel    Component Value Date/Time   CHOL 143 10/08/2014 0528   TRIG 88 10/08/2014 0528   HDL 36* 10/08/2014 0528   CHOLHDL 4.0 10/08/2014 0528   VLDL 18 10/08/2014 0528   LDLCALC 89 10/08/2014 0528      Wt Readings from Last 3 Encounters:  02/17/15 151 lb 12.8 oz (68.856 kg)  12/30/14 141 lb (63.957 kg)  12/24/14 145 lb 11.6 oz (66.1 kg)       ASSESSMENT AND PLAN:  Hypotension, unspecified hypotension type Resolved. His blood  pressure is still too low to add ACE inhibitor again  Coronary artery disease S/P CABG (coronary artery bypass graft) on 10/08/2014 Continues to progress well since his CABG. Continue ASA, statin, beta-blocker.He is strongly considering staying in the maintenance phase of the cardiac rehabilitation program.  He finishes the first phase in early September.  Ischemic cardiomyopathy  Recent Echo demonstrated improved LVF with EF now up to 45%. Continue beta-blocker. His blood pressure is still too low to add ACE inhibitor.   Essential hypertension Controlled.   Hyperlipidemia Continue statin. Managed by PCP.   Current medicines are reviewed at length with the patient today.  The patient does not have concerns regarding medicines.  The following changes have been made:  no change  Labs/ tests ordered today include:  No orders of the defined types were placed in this encounter.    Disposition: Continue current therapy.  His lipids are monitored by his PCP.  Recheck here in  3 months for office visit and EKG  Signed, Cassell Clement MD 02/17/2015 10:18 AM    [facility] [address] [place], [place], [place]  [address] Phone: [phone]; Fax: [phone]

## 2015-02-17 NOTE — Patient Instructions (Addendum)
Medication Instructions:  Your physician recommends that you continue on your current medications as directed. Please refer to the Current Medication list given to you today.  Labwork: none  Testing/Procedures: none  Follow-Up: Your physician recommends that you schedule a follow-up appointment in: 3 month ov/ekg      

## 2015-02-19 ENCOUNTER — Encounter (HOSPITAL_COMMUNITY)
Admission: RE | Admit: 2015-02-19 | Discharge: 2015-02-19 | Disposition: A | Discharge status: Home / Self Care | Payer: Medicare Other | Source: Ambulatory Visit | Attending: Cardiology | Admitting: Cardiology

## 2015-02-19 DIAGNOSIS — Z48812 Encounter for surgical aftercare following surgery on the circulatory system: Secondary | ICD-10-CM | POA: Diagnosis not present

## 2015-02-22 ENCOUNTER — Encounter (HOSPITAL_COMMUNITY)
Admission: RE | Admit: 2015-02-22 | Discharge: 2015-02-22 | Disposition: A | Discharge status: Home / Self Care | Payer: Medicare Other | Source: Ambulatory Visit | Attending: Cardiology | Admitting: Cardiology

## 2015-02-22 DIAGNOSIS — Z951 Presence of aortocoronary bypass graft: Secondary | ICD-10-CM | POA: Diagnosis not present

## 2015-02-22 DIAGNOSIS — I2510 Atherosclerotic heart disease of native coronary artery without angina pectoris: Secondary | ICD-10-CM | POA: Insufficient documentation

## 2015-02-22 DIAGNOSIS — Z48812 Encounter for surgical aftercare following surgery on the circulatory system: Secondary | ICD-10-CM | POA: Diagnosis not present

## 2015-02-24 ENCOUNTER — Encounter (HOSPITAL_COMMUNITY)
Admission: RE | Admit: 2015-02-24 | Discharge: 2015-02-24 | Disposition: A | Discharge status: Home / Self Care | Payer: Medicare Other | Source: Ambulatory Visit | Attending: Cardiology | Admitting: Cardiology

## 2015-02-24 DIAGNOSIS — Z48812 Encounter for surgical aftercare following surgery on the circulatory system: Secondary | ICD-10-CM | POA: Diagnosis not present

## 2015-02-26 ENCOUNTER — Encounter (HOSPITAL_COMMUNITY)
Admission: RE | Admit: 2015-02-26 | Discharge: 2015-02-26 | Disposition: A | Discharge status: Home / Self Care | Payer: Medicare Other | Source: Ambulatory Visit | Attending: Cardiology | Admitting: Cardiology

## 2015-02-26 DIAGNOSIS — Z48812 Encounter for surgical aftercare following surgery on the circulatory system: Secondary | ICD-10-CM | POA: Diagnosis not present

## 2015-02-26 NOTE — Progress Notes (Signed)
  60 day Psychosocial followup assessment  Patient psychosocial assessment reveals no barriers to cardiac rehab participation. Patient exhibits positive and healthy coping skills. Pt is quiet by nature and performs outlined tasks diligently. Patient reports health and activity level greatly improved in the past 30 days as evidenced by his upcoming trip to [facility] week after next. Will continue to monitor and evaluate progress toward psychosocial goals.  Alanson Aly, BSN

## 2015-03-01 ENCOUNTER — Encounter (HOSPITAL_COMMUNITY)
Admission: RE | Admit: 2015-03-01 | Discharge: 2015-03-01 | Disposition: A | Discharge status: Home / Self Care | Payer: Medicare Other | Source: Ambulatory Visit | Attending: Cardiology | Admitting: Cardiology

## 2015-03-01 DIAGNOSIS — Z48812 Encounter for surgical aftercare following surgery on the circulatory system: Secondary | ICD-10-CM | POA: Diagnosis not present

## 2015-03-03 ENCOUNTER — Encounter (HOSPITAL_COMMUNITY)
Admission: RE | Admit: 2015-03-03 | Discharge: 2015-03-03 | Disposition: A | Discharge status: Home / Self Care | Payer: Medicare Other | Source: Ambulatory Visit | Attending: Cardiology | Admitting: Cardiology

## 2015-03-03 DIAGNOSIS — Z48812 Encounter for surgical aftercare following surgery on the circulatory system: Secondary | ICD-10-CM | POA: Diagnosis not present

## 2015-03-05 ENCOUNTER — Encounter (HOSPITAL_COMMUNITY)
Admission: RE | Admit: 2015-03-05 | Discharge: 2015-03-05 | Disposition: A | Discharge status: Home / Self Care | Payer: Medicare Other | Source: Ambulatory Visit | Attending: Cardiology | Admitting: Cardiology

## 2015-03-05 DIAGNOSIS — Z48812 Encounter for surgical aftercare following surgery on the circulatory system: Secondary | ICD-10-CM | POA: Diagnosis not present

## 2015-03-08 ENCOUNTER — Encounter (HOSPITAL_COMMUNITY): Payer: Medicare Other

## 2015-03-10 ENCOUNTER — Encounter (HOSPITAL_COMMUNITY): Payer: Medicare Other

## 2015-03-12 ENCOUNTER — Encounter (HOSPITAL_COMMUNITY): Payer: Medicare Other

## 2015-03-15 ENCOUNTER — Encounter (HOSPITAL_COMMUNITY)
Admission: RE | Admit: 2015-03-15 | Discharge: 2015-03-15 | Disposition: A | Discharge status: Home / Self Care | Payer: Medicare Other | Source: Ambulatory Visit | Attending: Cardiology | Admitting: Cardiology

## 2015-03-15 DIAGNOSIS — Z48812 Encounter for surgical aftercare following surgery on the circulatory system: Secondary | ICD-10-CM | POA: Diagnosis not present

## 2015-03-17 ENCOUNTER — Encounter (HOSPITAL_COMMUNITY)
Admission: RE | Admit: 2015-03-17 | Discharge: 2015-03-17 | Disposition: A | Discharge status: Home / Self Care | Payer: Medicare Other | Source: Ambulatory Visit | Attending: Cardiology | Admitting: Cardiology

## 2015-03-17 DIAGNOSIS — Z48812 Encounter for surgical aftercare following surgery on the circulatory system: Secondary | ICD-10-CM | POA: Diagnosis not present

## 2015-03-19 ENCOUNTER — Encounter (HOSPITAL_COMMUNITY)
Admission: RE | Admit: 2015-03-19 | Discharge: 2015-03-19 | Disposition: A | Discharge status: Home / Self Care | Payer: Medicare Other | Source: Ambulatory Visit | Attending: Cardiology | Admitting: Cardiology

## 2015-03-19 DIAGNOSIS — Z48812 Encounter for surgical aftercare following surgery on the circulatory system: Secondary | ICD-10-CM | POA: Diagnosis not present

## 2015-03-22 ENCOUNTER — Encounter (HOSPITAL_COMMUNITY)
Admission: RE | Admit: 2015-03-22 | Discharge: 2015-03-22 | Disposition: A | Discharge status: Home / Self Care | Payer: Medicare Other | Source: Ambulatory Visit | Attending: Cardiology | Admitting: Cardiology

## 2015-03-22 DIAGNOSIS — Z48812 Encounter for surgical aftercare following surgery on the circulatory system: Secondary | ICD-10-CM | POA: Diagnosis not present

## 2015-03-24 ENCOUNTER — Encounter (HOSPITAL_COMMUNITY)
Admission: RE | Admit: 2015-03-24 | Discharge: 2015-03-24 | Disposition: A | Discharge status: Home / Self Care | Payer: Medicare Other | Source: Ambulatory Visit | Attending: Cardiology | Admitting: Cardiology

## 2015-03-24 DIAGNOSIS — Z48812 Encounter for surgical aftercare following surgery on the circulatory system: Secondary | ICD-10-CM | POA: Diagnosis not present

## 2015-03-26 ENCOUNTER — Encounter (HOSPITAL_COMMUNITY)
Admission: RE | Admit: 2015-03-26 | Discharge: 2015-03-26 | Disposition: A | Discharge status: Home / Self Care | Payer: Medicare Other | Source: Ambulatory Visit | Attending: Cardiology | Admitting: Cardiology

## 2015-03-26 DIAGNOSIS — Z951 Presence of aortocoronary bypass graft: Secondary | ICD-10-CM | POA: Diagnosis not present

## 2015-03-26 DIAGNOSIS — Z48812 Encounter for surgical aftercare following surgery on the circulatory system: Secondary | ICD-10-CM | POA: Insufficient documentation

## 2015-03-26 DIAGNOSIS — I2510 Atherosclerotic heart disease of native coronary artery without angina pectoris: Secondary | ICD-10-CM | POA: Diagnosis not present

## 2015-03-31 ENCOUNTER — Encounter (HOSPITAL_COMMUNITY)
Admission: RE | Admit: 2015-03-31 | Discharge: 2015-03-31 | Disposition: A | Discharge status: Home / Self Care | Payer: Medicare Other | Source: Ambulatory Visit | Attending: Cardiology | Admitting: Cardiology

## 2015-03-31 DIAGNOSIS — Z48812 Encounter for surgical aftercare following surgery on the circulatory system: Secondary | ICD-10-CM | POA: Diagnosis not present

## 2015-03-31 NOTE — Progress Notes (Signed)
Pt graduated from cardiac rehab program today with completion of 36 exercise sessions in Phase II. Pt maintained good attendance and progressed nicely during his participation in rehab as evidenced by increased MET level. Pt with increased MET level from 2.8 to 5.7.     Medication list reconciled. Repeat  PHQ score- 0 .  Pt has made significant lifestyle changes and should be commended for his success. Pt feels he has achieved his goals during cardiac rehab. Pt feels he has more flexibility and endurance. Pt able to walk 2 miles in 30 minutes on his off days from cardiac rehab.  Pt plans to continue exercise in cardiac maintenance program three times a week and walking at area church on tuesdays and thursdays.  It was indeed a great pleasure to have this pt exercise with [place] here at rehab. Cherre Huger, BSN

## 2015-04-19 ENCOUNTER — Encounter (HOSPITAL_COMMUNITY)
Admission: RE | Admit: 2015-04-19 | Discharge: 2015-04-19 | Disposition: A | Discharge status: Home / Self Care | Payer: Self-pay | Source: Ambulatory Visit | Attending: Cardiology | Admitting: Cardiology

## 2015-04-19 DIAGNOSIS — Z48812 Encounter for surgical aftercare following surgery on the circulatory system: Secondary | ICD-10-CM | POA: Insufficient documentation

## 2015-04-19 DIAGNOSIS — Z951 Presence of aortocoronary bypass graft: Secondary | ICD-10-CM | POA: Insufficient documentation

## 2015-04-21 ENCOUNTER — Encounter (HOSPITAL_COMMUNITY)
Admission: RE | Admit: 2015-04-21 | Discharge: 2015-04-21 | Disposition: A | Discharge status: Home / Self Care | Payer: Self-pay | Source: Ambulatory Visit | Attending: Cardiology | Admitting: Cardiology

## 2015-04-23 ENCOUNTER — Encounter (HOSPITAL_COMMUNITY)
Admission: RE | Admit: 2015-04-23 | Discharge: 2015-04-23 | Disposition: A | Discharge status: Home / Self Care | Payer: Self-pay | Source: Ambulatory Visit | Attending: Cardiology | Admitting: Cardiology

## 2015-04-26 ENCOUNTER — Encounter (HOSPITAL_COMMUNITY)
Admission: RE | Admit: 2015-04-26 | Discharge: 2015-04-26 | Disposition: A | Discharge status: Home / Self Care | Payer: Self-pay | Source: Ambulatory Visit | Attending: Cardiology | Admitting: Cardiology

## 2015-04-26 DIAGNOSIS — Z951 Presence of aortocoronary bypass graft: Secondary | ICD-10-CM | POA: Insufficient documentation

## 2015-04-26 DIAGNOSIS — Z48812 Encounter for surgical aftercare following surgery on the circulatory system: Secondary | ICD-10-CM | POA: Insufficient documentation

## 2015-04-28 ENCOUNTER — Encounter (HOSPITAL_COMMUNITY)
Admission: RE | Admit: 2015-04-28 | Discharge: 2015-04-28 | Disposition: A | Discharge status: Home / Self Care | Payer: Self-pay | Source: Ambulatory Visit | Attending: Cardiology | Admitting: Cardiology

## 2015-04-30 ENCOUNTER — Encounter (HOSPITAL_COMMUNITY)
Admission: RE | Admit: 2015-04-30 | Discharge: 2015-04-30 | Disposition: A | Discharge status: Home / Self Care | Payer: Self-pay | Source: Ambulatory Visit | Attending: Cardiology | Admitting: Cardiology

## 2015-05-03 ENCOUNTER — Encounter (HOSPITAL_COMMUNITY)
Admission: RE | Admit: 2015-05-03 | Discharge: 2015-05-03 | Disposition: A | Discharge status: Home / Self Care | Payer: Self-pay | Source: Ambulatory Visit | Attending: Cardiology | Admitting: Cardiology

## 2015-05-05 ENCOUNTER — Encounter (HOSPITAL_COMMUNITY)
Admission: RE | Admit: 2015-05-05 | Discharge: 2015-05-05 | Disposition: A | Discharge status: Home / Self Care | Payer: Self-pay | Source: Ambulatory Visit | Attending: Cardiology | Admitting: Cardiology

## 2015-05-07 ENCOUNTER — Encounter (HOSPITAL_COMMUNITY)
Admission: RE | Admit: 2015-05-07 | Discharge: 2015-05-07 | Disposition: A | Discharge status: Home / Self Care | Payer: Self-pay | Source: Ambulatory Visit | Attending: Cardiology | Admitting: Cardiology

## 2015-05-10 ENCOUNTER — Encounter (HOSPITAL_COMMUNITY)
Admission: RE | Admit: 2015-05-10 | Discharge: 2015-05-10 | Disposition: A | Discharge status: Home / Self Care | Payer: Self-pay | Source: Ambulatory Visit | Attending: Cardiology | Admitting: Cardiology

## 2015-05-12 ENCOUNTER — Encounter (HOSPITAL_COMMUNITY)
Admission: RE | Admit: 2015-05-12 | Discharge: 2015-05-12 | Disposition: A | Discharge status: Home / Self Care | Payer: Self-pay | Source: Ambulatory Visit | Attending: Cardiology | Admitting: Cardiology

## 2015-05-14 ENCOUNTER — Encounter (HOSPITAL_COMMUNITY)
Admission: RE | Admit: 2015-05-14 | Discharge: 2015-05-14 | Disposition: A | Discharge status: Home / Self Care | Payer: Self-pay | Source: Ambulatory Visit | Attending: Cardiology | Admitting: Cardiology

## 2015-05-17 ENCOUNTER — Encounter (HOSPITAL_COMMUNITY): Payer: Self-pay

## 2015-05-19 ENCOUNTER — Encounter (HOSPITAL_COMMUNITY): Payer: Self-pay

## 2015-05-21 ENCOUNTER — Encounter (HOSPITAL_COMMUNITY): Payer: Self-pay

## 2015-05-24 ENCOUNTER — Ambulatory Visit (INDEPENDENT_AMBULATORY_CARE_PROVIDER_SITE_OTHER): Payer: Medicare Other | Admitting: Cardiology

## 2015-05-24 ENCOUNTER — Encounter: Payer: Self-pay | Admitting: Cardiology

## 2015-05-24 ENCOUNTER — Encounter (HOSPITAL_COMMUNITY)
Admission: RE | Admit: 2015-05-24 | Discharge: 2015-05-24 | Disposition: A | Discharge status: Home / Self Care | Payer: Self-pay | Source: Ambulatory Visit | Attending: Cardiology | Admitting: Cardiology

## 2015-05-24 VITALS — BP 108/72 | HR 53 | Ht 66.0 in | Wt 151.8 lb

## 2015-05-24 DIAGNOSIS — I959 Hypotension, unspecified: Secondary | ICD-10-CM

## 2015-05-24 DIAGNOSIS — E785 Hyperlipidemia, unspecified: Secondary | ICD-10-CM | POA: Diagnosis not present

## 2015-05-24 DIAGNOSIS — Z951 Presence of aortocoronary bypass graft: Secondary | ICD-10-CM

## 2015-05-24 NOTE — Progress Notes (Signed)
Cardiology Office Note   Date:  05/24/2015   ID:  Merald, Stonge 04-21-1946, MRN [id number]  PCP:  Junious Silk, MD  Cardiologist: Cassell Clement MD  No chief complaint on file.     History of Present Illness: Anthony Vasquez is a 69 y.o. male who presents for a scheduled follow-up office visit.  Anthony Vasquez is a 69 y.o. male with a hx of HTN, HL, CKD. He was admitted 3/16-3/25 with a NSTEMI. He was taken for LHC that demonstrated EF 15-20%, severe 3 v CAD and LM involvement. He developed chest pain and hypotension during the procedure (cardiogenic shock) and an Impella device was placed. He was taken for emergent CABG by Dr. Donata Clay (L-LAD, S-RI, S-OM). Post op course was notable for ABL anemia but he did not require PRBCs. He remained in NSR. He has had a post op L effusion.   The patient was seen on 11/09/14 in office follow-up. He developed vasovagal syncope in the office. BP was in the 80s. We gave him IVFs and held his ACE inhibitor. Since stopping the ACE inhibitor he has had no further episodes of dizziness or syncope. His blood pressure at home rarely goes above 110 systolic. The patient had an echocardiogram 11/12/14 which demonstrated: - Mild LVH. EF 35% to 40%. Mid to apical anteroseptal akinesis, apical inferior akinesis, apical anterior akinesis, akinesis of the true apex. Grade 1 diastolic dysfunction  - Aortic valve: There was no stenosis. - Mitral valve: There was trivial regurgitation. - Right ventricle: The cavity size was normal. Systolic function was mildly reduced. - Pulmonary arteries: No complete TR doppler jet so unable to estimate PA systolic pressure. Impressions: Normal LV size with mild LV hypertrophy. EF 35-40%. Wall motion abnormalities as noted above. Normal RV size with mildly reduced systolic function. No significant valvular abnormalities.  The patient continues to do very well.  He is in the maintenance program  at cardiac rehabilitation.  On the off days he walks 2 miles in 30 minutes.  He is not having any exertional chest discomfort.  No dizziness or syncope.  No symptoms of CHF.  His blood pressure remains in the low normal range. The patient had a follow-up echocardiogram 02/10/15 which showed that his ejection fraction had improved to 45%.  Past Medical History  Diagnosis Date  . Hypertension   . Renal disorder   . Myocardial infarction [facility]) 09/2014    nstemi  . Ischemic cardiomyopathy     Echo 4/16: Mild LVH, EF 35-40%, anteroseptal, apical, inferior, apical anterior anteroapical akinesis, grade 1 diastolic dysfunction, mildly reduced RVSF    Past Surgical History  Procedure Laterality Date  . Tonsillectomy    . Colonoscopy  2015  . Left heart catheterization with coronary angiogram N/A 10/08/2014    Procedure: LEFT HEART CATHETERIZATION WITH CORONARY ANGIOGRAM;  Surgeon: Corky Crafts, MD;  Location: [facility] CATH LAB;  Service: Cardiovascular;  Laterality: N/A;  . Coronary artery bypass graft N/A 10/08/2014    Procedure: CORONARY ARTERY BYPASS GRAFTING (CABG);  Surgeon: Kerin Perna, MD;  Location: [facility] OR;  Service: Open Heart Surgery;  Laterality: N/A;  PATIENT IS GETTING AN IMPELLA IN THE CATH LAB CABG times 3 using left internal mammary artery and endoscopically harvested left saphenous vein.  Marland Kitchen Tee without cardioversion N/A 10/08/2014    Procedure: TRANSESOPHAGEAL ECHOCARDIOGRAM (TEE);  Surgeon: Kerin Perna, MD;  Location: [facility] OR;  Service: Open Heart Surgery;  Laterality: N/A;  Current Outpatient Prescriptions  Medication Sig Dispense Refill  . aspirin 81 MG tablet Take 81 mg by mouth daily.    . metoprolol tartrate (LOPRESSOR) 25 MG tablet Take 0.5 tablets (12.5 mg total) by mouth 2 (two) times daily. 30 tablet 11  . pravastatin (PRAVACHOL) 80 MG tablet Take 80 mg by mouth daily.    . Vitamin D, Ergocalciferol, (DRISDOL) 50000 UNITS CAPS capsule Take 50,000 Units by mouth  every 14 (fourteen) days.     No current facility-administered medications for this visit.    Allergies:   Review of patient's allergies indicates no known allergies.    Social History:  The patient  reports that he has never smoked. He has never used smokeless tobacco. He reports that he does not drink alcohol or use illicit drugs.   Family History:  The patient's family history includes Healthy in his brother; Heart attack in his mother; Heart disease in his father and mother. There is no history of Stroke.    ROS:  Please see the history of present illness.   Otherwise, review of systems are positive for none.   All other systems are reviewed and negative.    PHYSICAL EXAM: VS:  BP 108/72 mmHg  Pulse 53  Ht  (1.676 m)  Wt 151 lb 12.8 oz (68.856 kg)  BMI 24.51 kg/m2 , BMI Body mass index is 24.51 kg/(m^2). GEN: Well nourished, well developed, in no acute distress HEENT: normal Neck: no JVD, carotid bruits, or masses Cardiac: RRR; no murmurs, rubs, or gallops,no edema  Respiratory:  clear to auscultation bilaterally, normal work of breathing GI: soft, nontender, nondistended, + BS MS: no deformity or atrophy Skin: warm and dry, no rash Neuro:  Strength and sensation are intact Psych: euthymic mood, full affect   EKG:  EKG is ordered today. The ekg ordered today demonstrates normal sinus rhythm.  Nonspecific ST-T wave changes.   Recent Labs: 10/07/2014: B Natriuretic Peptide 426.5*; TSH 3.043 10/09/2014: Magnesium 2.0 11/09/2014: BUN 22; Creatinine, Ser 1.35; Hemoglobin 9.7*; Platelets 522.0*; Potassium 4.3; Sodium 132* 11/16/2014: ALT 41    Lipid Panel    Component Value Date/Time   CHOL 143 10/08/2014 0528   TRIG 88 10/08/2014 0528   HDL 36* 10/08/2014 0528   CHOLHDL 4.0 10/08/2014 0528   VLDL 18 10/08/2014 0528   LDLCALC 89 10/08/2014 0528      Wt Readings from Last 3 Encounters:  05/24/15 151 lb 12.8 oz (68.856 kg)  02/17/15 151 lb 12.8 oz (68.856 kg)    12/30/14 141 lb (63.957 kg)         ASSESSMENT AND PLAN:   Hypotension, unspecified hypotension type  His blood pressure is still too low to add ACE inhibitor again  Coronary artery disease S/P CABG (coronary artery bypass graft) on 10/08/2014 Continues to progress well since his CABG. Continue ASA, statin, beta-blocker.Currently he is taking aspirin 325 mg daily.  For cardiac indications we will decrease that to just 81 mg daily  Ischemic cardiomyopathy  Recent Echo demonstrated improved LVF with EF now up to 45%. Continue beta-blocker. His blood pressure is still too low to add ACE inhibitor.   Essential hypertension Controlled.   Hyperlipidemia Continue statin. Managed by PCP.   Current medicines are reviewed at length with the patient today. The patient does not have concerns regarding medicines.    Labs/ tests ordered today include:  No orders of the defined types were placed in this encounter.      Current  medicines are reviewed at length with the patient today.  The patient does not have concerns regarding medicines.  The following changes have been made:  Reduce ASA to 81 mg daily.  Labs/ tests ordered today include:   Orders Placed This Encounter  Procedures  . EKG 12-Lead    Disposition: Recheck in 6 months for follow-up office visit.  No change other than to reduce aspirin to 81 mg daily.  Karie Schwalbe MD 05/24/2015 8:16 AM    [facility] [address] [place], [place], [place]  [address] Phone: [phone]; Fax: [phone]

## 2015-05-24 NOTE — Patient Instructions (Signed)
Medication Instructions:  DECREASE YOUR ASPIRIN TO 81 MG DAILY  Labwork: NONE  Testing/Procedures: NON  Follow-Up: Your physician recommends that you schedule a follow-up appointment in: 6 MONTH OV WITH LORI G NP/SCOTT W PA    If you need a refill on your cardiac medications before your next appointment, please call your pharmacy.

## 2015-05-26 ENCOUNTER — Encounter (HOSPITAL_COMMUNITY)
Admission: RE | Admit: 2015-05-26 | Discharge: 2015-05-26 | Disposition: A | Discharge status: Home / Self Care | Payer: Self-pay | Source: Ambulatory Visit | Attending: Cardiology | Admitting: Cardiology

## 2015-05-26 DIAGNOSIS — Z951 Presence of aortocoronary bypass graft: Secondary | ICD-10-CM | POA: Insufficient documentation

## 2015-05-26 DIAGNOSIS — Z48812 Encounter for surgical aftercare following surgery on the circulatory system: Secondary | ICD-10-CM | POA: Insufficient documentation

## 2015-05-28 ENCOUNTER — Encounter (HOSPITAL_COMMUNITY)
Admission: RE | Admit: 2015-05-28 | Discharge: 2015-05-28 | Disposition: A | Discharge status: Home / Self Care | Payer: Self-pay | Source: Ambulatory Visit | Attending: Cardiology | Admitting: Cardiology

## 2015-05-31 ENCOUNTER — Encounter (HOSPITAL_COMMUNITY)
Admission: RE | Admit: 2015-05-31 | Discharge: 2015-05-31 | Disposition: A | Discharge status: Home / Self Care | Payer: Self-pay | Source: Ambulatory Visit | Attending: Cardiology | Admitting: Cardiology

## 2015-06-02 ENCOUNTER — Encounter (HOSPITAL_COMMUNITY): Payer: Self-pay

## 2015-06-04 ENCOUNTER — Encounter (HOSPITAL_COMMUNITY): Payer: Self-pay

## 2015-06-07 ENCOUNTER — Encounter (HOSPITAL_COMMUNITY)
Admission: RE | Admit: 2015-06-07 | Discharge: 2015-06-07 | Disposition: A | Discharge status: Home / Self Care | Payer: Self-pay | Source: Ambulatory Visit | Attending: Cardiology | Admitting: Cardiology

## 2015-06-09 ENCOUNTER — Encounter (HOSPITAL_COMMUNITY)
Admission: RE | Admit: 2015-06-09 | Discharge: 2015-06-09 | Disposition: A | Discharge status: Home / Self Care | Payer: Self-pay | Source: Ambulatory Visit | Attending: Cardiology | Admitting: Cardiology

## 2015-06-11 ENCOUNTER — Encounter (HOSPITAL_COMMUNITY)
Admission: RE | Admit: 2015-06-11 | Discharge: 2015-06-11 | Disposition: A | Discharge status: Home / Self Care | Payer: Self-pay | Source: Ambulatory Visit | Attending: Cardiology | Admitting: Cardiology

## 2015-06-14 ENCOUNTER — Encounter (HOSPITAL_COMMUNITY)
Admission: RE | Admit: 2015-06-14 | Discharge: 2015-06-14 | Disposition: A | Discharge status: Home / Self Care | Payer: Self-pay | Source: Ambulatory Visit | Attending: Cardiology | Admitting: Cardiology

## 2015-06-16 ENCOUNTER — Encounter (HOSPITAL_COMMUNITY)
Admission: RE | Admit: 2015-06-16 | Discharge: 2015-06-16 | Disposition: A | Discharge status: Home / Self Care | Payer: Self-pay | Source: Ambulatory Visit | Attending: Cardiology | Admitting: Cardiology

## 2015-06-18 ENCOUNTER — Encounter (HOSPITAL_COMMUNITY): Payer: Self-pay

## 2015-06-21 ENCOUNTER — Encounter (HOSPITAL_COMMUNITY)
Admission: RE | Admit: 2015-06-21 | Discharge: 2015-06-21 | Disposition: A | Discharge status: Home / Self Care | Payer: Self-pay | Source: Ambulatory Visit | Attending: Cardiology | Admitting: Cardiology

## 2015-06-23 ENCOUNTER — Encounter (HOSPITAL_COMMUNITY)
Admission: RE | Admit: 2015-06-23 | Discharge: 2015-06-23 | Disposition: A | Discharge status: Home / Self Care | Payer: Self-pay | Source: Ambulatory Visit | Attending: Cardiology | Admitting: Cardiology

## 2015-06-25 ENCOUNTER — Encounter (HOSPITAL_COMMUNITY)
Admission: RE | Admit: 2015-06-25 | Discharge: 2015-06-25 | Disposition: A | Discharge status: Home / Self Care | Payer: Self-pay | Source: Ambulatory Visit | Attending: Cardiology | Admitting: Cardiology

## 2015-06-25 DIAGNOSIS — Z951 Presence of aortocoronary bypass graft: Secondary | ICD-10-CM | POA: Insufficient documentation

## 2015-06-25 DIAGNOSIS — Z48812 Encounter for surgical aftercare following surgery on the circulatory system: Secondary | ICD-10-CM | POA: Insufficient documentation

## 2015-06-28 ENCOUNTER — Encounter (HOSPITAL_COMMUNITY)
Admission: RE | Admit: 2015-06-28 | Discharge: 2015-06-28 | Disposition: A | Discharge status: Home / Self Care | Payer: Self-pay | Source: Ambulatory Visit | Attending: Cardiology | Admitting: Cardiology

## 2015-06-30 ENCOUNTER — Encounter (HOSPITAL_COMMUNITY)
Admission: RE | Admit: 2015-06-30 | Discharge: 2015-06-30 | Disposition: A | Discharge status: Home / Self Care | Payer: Self-pay | Source: Ambulatory Visit | Attending: Cardiology | Admitting: Cardiology

## 2015-07-02 ENCOUNTER — Encounter (HOSPITAL_COMMUNITY): Payer: Self-pay

## 2015-07-05 ENCOUNTER — Encounter (HOSPITAL_COMMUNITY)
Admission: RE | Admit: 2015-07-05 | Discharge: 2015-07-05 | Disposition: A | Discharge status: Home / Self Care | Payer: Self-pay | Source: Ambulatory Visit | Attending: Cardiology | Admitting: Cardiology

## 2015-07-07 ENCOUNTER — Encounter (HOSPITAL_COMMUNITY)
Admission: RE | Admit: 2015-07-07 | Discharge: 2015-07-07 | Disposition: A | Discharge status: Home / Self Care | Payer: Self-pay | Source: Ambulatory Visit | Attending: Cardiology | Admitting: Cardiology

## 2015-07-09 ENCOUNTER — Encounter (HOSPITAL_COMMUNITY)
Admission: RE | Admit: 2015-07-09 | Discharge: 2015-07-09 | Disposition: A | Discharge status: Home / Self Care | Payer: Self-pay | Source: Ambulatory Visit | Attending: Cardiology | Admitting: Cardiology

## 2015-07-12 ENCOUNTER — Encounter (HOSPITAL_COMMUNITY)
Admission: RE | Admit: 2015-07-12 | Discharge: 2015-07-12 | Disposition: A | Discharge status: Home / Self Care | Payer: Self-pay | Source: Ambulatory Visit | Attending: Cardiology | Admitting: Cardiology

## 2015-07-14 ENCOUNTER — Encounter (HOSPITAL_COMMUNITY)
Admission: RE | Admit: 2015-07-14 | Discharge: 2015-07-14 | Disposition: A | Discharge status: Home / Self Care | Payer: Self-pay | Source: Ambulatory Visit | Attending: Cardiology | Admitting: Cardiology

## 2015-07-16 ENCOUNTER — Encounter (HOSPITAL_COMMUNITY)
Admission: RE | Admit: 2015-07-16 | Discharge: 2015-07-16 | Disposition: A | Discharge status: Home / Self Care | Payer: Self-pay | Source: Ambulatory Visit | Attending: Cardiology | Admitting: Cardiology

## 2015-07-19 ENCOUNTER — Encounter (HOSPITAL_COMMUNITY): Payer: Self-pay

## 2015-07-21 ENCOUNTER — Encounter (HOSPITAL_COMMUNITY)
Admission: RE | Admit: 2015-07-21 | Discharge: 2015-07-21 | Disposition: A | Discharge status: Home / Self Care | Payer: Self-pay | Source: Ambulatory Visit | Attending: Cardiology | Admitting: Cardiology

## 2015-07-23 ENCOUNTER — Encounter (HOSPITAL_COMMUNITY)
Admission: RE | Admit: 2015-07-23 | Discharge: 2015-07-23 | Disposition: A | Discharge status: Home / Self Care | Payer: Self-pay | Source: Ambulatory Visit | Attending: Cardiology | Admitting: Cardiology

## 2015-07-28 ENCOUNTER — Encounter (HOSPITAL_COMMUNITY)
Admission: RE | Admit: 2015-07-28 | Discharge: 2015-07-28 | Disposition: A | Discharge status: Home / Self Care | Payer: Self-pay | Source: Ambulatory Visit | Attending: Cardiology | Admitting: Cardiology

## 2015-07-28 DIAGNOSIS — Z48812 Encounter for surgical aftercare following surgery on the circulatory system: Secondary | ICD-10-CM | POA: Insufficient documentation

## 2015-07-28 DIAGNOSIS — Z951 Presence of aortocoronary bypass graft: Secondary | ICD-10-CM | POA: Insufficient documentation

## 2015-07-30 ENCOUNTER — Encounter (HOSPITAL_COMMUNITY)
Admission: RE | Admit: 2015-07-30 | Discharge: 2015-07-30 | Disposition: A | Discharge status: Home / Self Care | Payer: Self-pay | Source: Ambulatory Visit | Attending: Cardiology | Admitting: Cardiology

## 2015-08-04 ENCOUNTER — Encounter (HOSPITAL_COMMUNITY)
Admission: RE | Admit: 2015-08-04 | Discharge: 2015-08-04 | Disposition: A | Discharge status: Home / Self Care | Payer: Self-pay | Source: Ambulatory Visit | Attending: Cardiology | Admitting: Cardiology

## 2015-08-09 ENCOUNTER — Encounter (HOSPITAL_COMMUNITY)
Admission: RE | Admit: 2015-08-09 | Discharge: 2015-08-09 | Disposition: A | Discharge status: Home / Self Care | Payer: Self-pay | Source: Ambulatory Visit | Attending: Cardiology | Admitting: Cardiology

## 2015-08-25 ENCOUNTER — Encounter (HOSPITAL_COMMUNITY)
Admission: RE | Admit: 2015-08-25 | Discharge: 2015-08-25 | Disposition: A | Discharge status: Home / Self Care | Payer: Self-pay | Source: Ambulatory Visit | Attending: Cardiology | Admitting: Cardiology

## 2015-08-25 DIAGNOSIS — Z951 Presence of aortocoronary bypass graft: Secondary | ICD-10-CM | POA: Insufficient documentation

## 2015-08-27 ENCOUNTER — Encounter (HOSPITAL_COMMUNITY)
Admission: RE | Admit: 2015-08-27 | Discharge: 2015-08-27 | Disposition: A | Discharge status: Home / Self Care | Payer: Self-pay | Source: Ambulatory Visit | Attending: Cardiology | Admitting: Cardiology

## 2015-08-30 ENCOUNTER — Encounter (HOSPITAL_COMMUNITY)
Admission: RE | Admit: 2015-08-30 | Discharge: 2015-08-30 | Disposition: A | Discharge status: Home / Self Care | Payer: Self-pay | Source: Ambulatory Visit | Attending: Cardiology | Admitting: Cardiology

## 2015-09-01 ENCOUNTER — Encounter (HOSPITAL_COMMUNITY)
Admission: RE | Admit: 2015-09-01 | Discharge: 2015-09-01 | Disposition: A | Discharge status: Home / Self Care | Payer: Self-pay | Source: Ambulatory Visit | Attending: Cardiology | Admitting: Cardiology

## 2015-09-03 ENCOUNTER — Encounter (HOSPITAL_COMMUNITY)
Admission: RE | Admit: 2015-09-03 | Discharge: 2015-09-03 | Disposition: A | Discharge status: Home / Self Care | Payer: Self-pay | Source: Ambulatory Visit | Attending: Cardiology | Admitting: Cardiology

## 2015-09-06 ENCOUNTER — Encounter (HOSPITAL_COMMUNITY)
Admission: RE | Admit: 2015-09-06 | Discharge: 2015-09-06 | Disposition: A | Discharge status: Home / Self Care | Payer: Self-pay | Source: Ambulatory Visit | Attending: Cardiology | Admitting: Cardiology

## 2015-09-08 ENCOUNTER — Encounter (HOSPITAL_COMMUNITY)
Admission: RE | Admit: 2015-09-08 | Discharge: 2015-09-08 | Disposition: A | Discharge status: Home / Self Care | Payer: Self-pay | Source: Ambulatory Visit | Attending: Cardiology | Admitting: Cardiology

## 2015-09-10 ENCOUNTER — Encounter (HOSPITAL_COMMUNITY)
Admission: RE | Admit: 2015-09-10 | Discharge: 2015-09-10 | Disposition: A | Discharge status: Home / Self Care | Payer: Self-pay | Source: Ambulatory Visit | Attending: Cardiology | Admitting: Cardiology

## 2015-09-13 ENCOUNTER — Encounter (HOSPITAL_COMMUNITY)
Admission: RE | Admit: 2015-09-13 | Discharge: 2015-09-13 | Disposition: A | Discharge status: Home / Self Care | Payer: Self-pay | Source: Ambulatory Visit | Attending: Cardiology | Admitting: Cardiology

## 2015-09-15 ENCOUNTER — Encounter (HOSPITAL_COMMUNITY)
Admission: RE | Admit: 2015-09-15 | Discharge: 2015-09-15 | Disposition: A | Discharge status: Home / Self Care | Payer: Self-pay | Source: Ambulatory Visit | Attending: Cardiology | Admitting: Cardiology

## 2015-09-17 ENCOUNTER — Encounter (HOSPITAL_COMMUNITY)
Admission: RE | Admit: 2015-09-17 | Discharge: 2015-09-17 | Disposition: A | Discharge status: Home / Self Care | Payer: Self-pay | Source: Ambulatory Visit | Attending: Cardiology | Admitting: Cardiology

## 2015-09-20 ENCOUNTER — Encounter (HOSPITAL_COMMUNITY)
Admission: RE | Admit: 2015-09-20 | Discharge: 2015-09-20 | Disposition: A | Discharge status: Home / Self Care | Payer: Self-pay | Source: Ambulatory Visit | Attending: Cardiology | Admitting: Cardiology

## 2015-09-22 ENCOUNTER — Encounter (HOSPITAL_COMMUNITY)
Admission: RE | Admit: 2015-09-22 | Discharge: 2015-09-22 | Disposition: A | Discharge status: Home / Self Care | Payer: Self-pay | Source: Ambulatory Visit | Attending: Cardiology | Admitting: Cardiology

## 2015-09-22 DIAGNOSIS — Z951 Presence of aortocoronary bypass graft: Secondary | ICD-10-CM | POA: Insufficient documentation

## 2015-09-24 ENCOUNTER — Encounter (HOSPITAL_COMMUNITY)
Admission: RE | Admit: 2015-09-24 | Discharge: 2015-09-24 | Disposition: A | Discharge status: Home / Self Care | Payer: Self-pay | Source: Ambulatory Visit | Attending: Cardiology | Admitting: Cardiology

## 2015-09-27 ENCOUNTER — Encounter (HOSPITAL_COMMUNITY)
Admission: RE | Admit: 2015-09-27 | Discharge: 2015-09-27 | Disposition: A | Discharge status: Home / Self Care | Payer: Self-pay | Source: Ambulatory Visit | Attending: Cardiology | Admitting: Cardiology

## 2015-09-29 ENCOUNTER — Encounter (HOSPITAL_COMMUNITY)
Admission: RE | Admit: 2015-09-29 | Discharge: 2015-09-29 | Disposition: A | Discharge status: Home / Self Care | Payer: Self-pay | Source: Ambulatory Visit | Attending: Cardiology | Admitting: Cardiology

## 2015-10-01 ENCOUNTER — Encounter (HOSPITAL_COMMUNITY)
Admission: RE | Admit: 2015-10-01 | Discharge: 2015-10-01 | Disposition: A | Discharge status: Home / Self Care | Payer: Self-pay | Source: Ambulatory Visit | Attending: Cardiology | Admitting: Cardiology

## 2015-10-04 ENCOUNTER — Encounter (HOSPITAL_COMMUNITY)
Admission: RE | Admit: 2015-10-04 | Discharge: 2015-10-04 | Disposition: A | Discharge status: Home / Self Care | Payer: Self-pay | Source: Ambulatory Visit | Attending: Cardiology | Admitting: Cardiology

## 2015-10-06 ENCOUNTER — Encounter (HOSPITAL_COMMUNITY)
Admission: RE | Admit: 2015-10-06 | Discharge: 2015-10-06 | Disposition: A | Discharge status: Home / Self Care | Payer: Self-pay | Source: Ambulatory Visit | Attending: Cardiology | Admitting: Cardiology

## 2015-10-08 ENCOUNTER — Encounter (HOSPITAL_COMMUNITY)
Admission: RE | Admit: 2015-10-08 | Discharge: 2015-10-08 | Disposition: A | Discharge status: Home / Self Care | Payer: Self-pay | Source: Ambulatory Visit | Attending: Cardiology | Admitting: Cardiology

## 2015-10-11 ENCOUNTER — Encounter (HOSPITAL_COMMUNITY)
Admission: RE | Admit: 2015-10-11 | Discharge: 2015-10-11 | Disposition: A | Discharge status: Home / Self Care | Payer: Self-pay | Source: Ambulatory Visit | Attending: Cardiology | Admitting: Cardiology

## 2015-10-13 ENCOUNTER — Encounter (HOSPITAL_COMMUNITY)
Admission: RE | Admit: 2015-10-13 | Discharge: 2015-10-13 | Disposition: A | Discharge status: Home / Self Care | Payer: Self-pay | Source: Ambulatory Visit | Attending: Cardiology | Admitting: Cardiology

## 2015-10-15 ENCOUNTER — Encounter (HOSPITAL_COMMUNITY)
Admission: RE | Admit: 2015-10-15 | Discharge: 2015-10-15 | Disposition: A | Discharge status: Home / Self Care | Payer: Self-pay | Source: Ambulatory Visit | Attending: Cardiology | Admitting: Cardiology

## 2015-10-18 ENCOUNTER — Encounter (HOSPITAL_COMMUNITY)
Admission: RE | Admit: 2015-10-18 | Discharge: 2015-10-18 | Disposition: A | Discharge status: Home / Self Care | Payer: Self-pay | Source: Ambulatory Visit | Attending: Cardiology | Admitting: Cardiology

## 2015-10-20 ENCOUNTER — Encounter (HOSPITAL_COMMUNITY)
Admission: RE | Admit: 2015-10-20 | Discharge: 2015-10-20 | Disposition: A | Discharge status: Home / Self Care | Payer: Self-pay | Source: Ambulatory Visit | Attending: Cardiology | Admitting: Cardiology

## 2015-10-22 ENCOUNTER — Encounter (HOSPITAL_COMMUNITY)
Admission: RE | Admit: 2015-10-22 | Discharge: 2015-10-22 | Disposition: A | Discharge status: Home / Self Care | Payer: Self-pay | Source: Ambulatory Visit | Attending: Cardiology | Admitting: Cardiology

## 2015-10-25 ENCOUNTER — Encounter (HOSPITAL_COMMUNITY)
Admission: RE | Admit: 2015-10-25 | Discharge: 2015-10-25 | Disposition: A | Discharge status: Home / Self Care | Payer: Self-pay | Source: Ambulatory Visit | Attending: Nurse Practitioner | Admitting: Nurse Practitioner

## 2015-10-25 DIAGNOSIS — Z951 Presence of aortocoronary bypass graft: Secondary | ICD-10-CM | POA: Insufficient documentation

## 2015-10-27 ENCOUNTER — Encounter (HOSPITAL_COMMUNITY)
Admission: RE | Admit: 2015-10-27 | Discharge: 2015-10-27 | Disposition: A | Discharge status: Home / Self Care | Payer: Self-pay | Source: Ambulatory Visit | Attending: Nurse Practitioner | Admitting: Nurse Practitioner

## 2015-10-29 ENCOUNTER — Encounter (HOSPITAL_COMMUNITY)
Admission: RE | Admit: 2015-10-29 | Discharge: 2015-10-29 | Disposition: A | Discharge status: Home / Self Care | Payer: Self-pay | Source: Ambulatory Visit | Attending: Nurse Practitioner | Admitting: Nurse Practitioner

## 2015-11-01 ENCOUNTER — Encounter (HOSPITAL_COMMUNITY)
Admission: RE | Admit: 2015-11-01 | Discharge: 2015-11-01 | Disposition: A | Discharge status: Home / Self Care | Payer: Self-pay | Source: Ambulatory Visit | Attending: Nurse Practitioner | Admitting: Nurse Practitioner

## 2015-11-03 ENCOUNTER — Encounter (HOSPITAL_COMMUNITY)
Admission: RE | Admit: 2015-11-03 | Discharge: 2015-11-03 | Disposition: A | Discharge status: Home / Self Care | Payer: Self-pay | Source: Ambulatory Visit | Attending: Nurse Practitioner | Admitting: Nurse Practitioner

## 2015-11-05 ENCOUNTER — Encounter (HOSPITAL_COMMUNITY): Payer: Self-pay

## 2015-11-08 ENCOUNTER — Encounter (HOSPITAL_COMMUNITY)
Admission: RE | Admit: 2015-11-08 | Discharge: 2015-11-08 | Disposition: A | Discharge status: Home / Self Care | Payer: Self-pay | Source: Ambulatory Visit | Attending: Nurse Practitioner | Admitting: Nurse Practitioner

## 2015-11-10 ENCOUNTER — Encounter (HOSPITAL_COMMUNITY): Payer: Self-pay

## 2015-11-12 ENCOUNTER — Encounter (HOSPITAL_COMMUNITY): Payer: Self-pay

## 2015-11-15 ENCOUNTER — Encounter (HOSPITAL_COMMUNITY): Admission: RE | Admit: 2015-11-15 | Payer: Self-pay | Source: Ambulatory Visit

## 2015-11-17 ENCOUNTER — Encounter (HOSPITAL_COMMUNITY)
Admission: RE | Admit: 2015-11-17 | Discharge: 2015-11-17 | Disposition: A | Discharge status: Home / Self Care | Payer: Self-pay | Source: Ambulatory Visit | Attending: Nurse Practitioner | Admitting: Nurse Practitioner

## 2015-11-19 ENCOUNTER — Encounter (HOSPITAL_COMMUNITY)
Admission: RE | Admit: 2015-11-19 | Discharge: 2015-11-19 | Disposition: A | Discharge status: Home / Self Care | Payer: Self-pay | Source: Ambulatory Visit | Attending: Nurse Practitioner | Admitting: Nurse Practitioner

## 2015-11-22 ENCOUNTER — Encounter (HOSPITAL_COMMUNITY)
Admission: RE | Admit: 2015-11-22 | Discharge: 2015-11-22 | Disposition: A | Discharge status: Home / Self Care | Payer: Self-pay | Source: Ambulatory Visit | Attending: Nurse Practitioner | Admitting: Nurse Practitioner

## 2015-11-22 DIAGNOSIS — Z951 Presence of aortocoronary bypass graft: Secondary | ICD-10-CM | POA: Insufficient documentation

## 2015-11-23 ENCOUNTER — Encounter: Payer: Self-pay | Admitting: Nurse Practitioner

## 2015-11-23 ENCOUNTER — Ambulatory Visit (INDEPENDENT_AMBULATORY_CARE_PROVIDER_SITE_OTHER): Payer: Medicare Other | Admitting: Nurse Practitioner

## 2015-11-23 VITALS — BP 112/74 | HR 56 | Ht 66.0 in | Wt 154.8 lb

## 2015-11-23 DIAGNOSIS — I2510 Atherosclerotic heart disease of native coronary artery without angina pectoris: Secondary | ICD-10-CM | POA: Diagnosis not present

## 2015-11-23 DIAGNOSIS — I10 Essential (primary) hypertension: Secondary | ICD-10-CM

## 2015-11-23 NOTE — Patient Instructions (Addendum)
We will be checking the following labs today - NONE   Medication Instructions:    Continue with your current medicines.     Testing/Procedures To Be Arranged:  N/A  Follow-Up:   See Dr. Duke Salvia in 6 months.     Other Special Instructions:   Keep up the good work! Exercise daily!    If you need a refill on your cardiac medications before your next appointment, please call your pharmacy.   Call the [facility] office at [phone] if you have any questions, problems or concerns.

## 2015-11-23 NOTE — Progress Notes (Signed)
CARDIOLOGY OFFICE NOTE  Date:  11/23/2015    Anthony Vasquez Date of Birth: June 16, 1946 Medical Record #[id number]  PCP:  Junious Silk, MD  Cardiologist:  Former patient of Dr. Yevonne Pax  - to establish with Dr. Duke Salvia  Chief Complaint  Patient presents with  . Coronary Artery Disease  . Hypertension  . Hyperlipidemia    Follow up visit - former patient of Dr. Yevonne Pax.     History of Present Illness: Anthony Vasquez is a 70 y.o. male who presents today for a 6 month check. Former patient of Dr. Yevonne Pax.   He has a hx of CAD, HTN, HL, CKD. He was admitted 3/16-3/25 with a NSTEMI. He was taken for LHC that demonstrated EF 15-20%, severe 3 v CAD and LM involvement. He developed chest pain and hypotension during the procedure (cardiogenic shock) and an Impella device was placed. He was taken for emergent CABG by Dr. Donata Clay (L-LAD, S-RI, S-OM). Post op course was notable for ABL anemia but he did not require PRBCs. He remained in NSR. He has had a post op L effusion.Follow up echo 10/2014 with improvement in EF to 35 to 40% and echo in 01/2015 showed improvement to 45%. He has continued with medical management.   Last seen in October and was felt to be doing well.  Comes back today. Here alone. Labs are done by PCP. He has a physical later this month with labs. No real issue. Happy with how he is feeling. No chest pain. Breathing is good. No swelling. No palpitations but some rare racing at night - nothing that bothers him. Weight stable. He continues in cardiac rehab maintenance program. Stopped his walking the other days due to congestion at the walking track but planning on trying to resume.   Past Medical History  Diagnosis Date  . Hypertension   . Renal disorder   . Myocardial infarction [facility]) 09/2014    nstemi  . Ischemic cardiomyopathy     Echo 4/16: Mild LVH, EF 35-40%, anteroseptal, apical, inferior, apical anterior anteroapical akinesis, grade 1  diastolic dysfunction, mildly reduced RVSF    Past Surgical History  Procedure Laterality Date  . Tonsillectomy    . Colonoscopy  2015  . Left heart catheterization with coronary angiogram N/A 10/08/2014    Procedure: LEFT HEART CATHETERIZATION WITH CORONARY ANGIOGRAM;  Surgeon: Corky Crafts, MD;  [facility] C[facility] LAB;  Service: Cardiovascular;  Laterality: N/A;  . Coronary artery bypass graft N/A 10/08/2014    Procedure: CORONARY ARTERY BYPASS GRAFTING (CABG);  Surgeon: Kerin Perna, MD;  [facility];  Service: Open Heart Surgery;  Laterality: N/A;  PATIENT IS GETTING AN IMPELLA IN THE CATH LAB CABG times 3 using left internal mammary artery and endoscopically harvested left saphenous vein.  Marland Kitchen Tee without cardioversion N/A 10/08/2014    Procedure: TRANSESOPHAGEAL ECHOCARDIOGRAM (TEE);  Surgeon: Kerin Perna, MD;  Location[facility] OR;  Service: Open Heart Surgery;  Laterality: N/A;     Medications: Current Outpatient Prescriptions  Medication Sig Dispense Refill  . aspirin 81 MG tablet Take 81 mg by mouth daily.    . metoprolol tartrate (LOPRESSOR) 25 MG tablet Take 0.5 tablets (12.5 mg total) by mouth 2 (two) times daily. 30 tablet 11  . pravastatin (PRAVACHOL) 80 MG tablet Take 80 mg by mouth daily.    . Vitamin D, Ergocalciferol, (DRISDOL) 50000 UNITS CAPS capsule Take 50,000 Units by mouth every 14 (fourteen) days.  No current facility-administered medications for this visit.    Allergies: No Known Allergies  Social History: The patient  reports that he has never smoked. He has never used smokeless tobacco. He reports that he does not drink alcohol or use illicit drugs.   Family History: The patient's family history includes Healthy in his brother; Heart attack in his mother; Heart disease in his father and mother. There is no history of Stroke.   Review of Systems: Please see the history of present illness.   Otherwise, the review of systems is positive for  none.   All other systems are reviewed and negative.   Physical Exam: VS:  BP 112/74 mmHg  Pulse 56  Ht 5\' 6"  (1.676 m)  Wt 154 lb 12.8 oz (70.217 kg)  BMI 25.00 kg/m2 .  BMI Body mass index is 25 kg/(m^2).  Wt Readings from Last 3 Encounters:  11/23/15 154 lb 12.8 oz (70.217 kg)  05/24/15 151 lb 12.8 oz (68.856 kg)  02/17/15 151 lb 12.8 oz (68.856 kg)    General: Pleasant. Well developed, well nourished and in no acute distress.  HEENT: Normal. Neck: Supple, no JVD, carotid bruits, or masses noted.  Cardiac: Regular rate and rhythm. No murmurs, rubs, or gallops. No edema.  Respiratory:  Lungs are clear to auscultation bilaterally with normal work of breathing.  GI: Soft and nontender.  MS: No deformity or atrophy. Gait and ROM intact. Skin: Warm and dry. Color is normal.  Neuro:  Strength and sensation are intact and no gross focal deficits noted.  Psych: Alert, appropriate and with normal affect.   LABORATORY DATA:  EKG:  EKG is not ordered today.  Lab Results  Component Value Date   WBC 8.8 11/09/2014   HGB 9.7* 11/09/2014   HCT 30.2* 11/09/2014   PLT 522.0* 11/09/2014   GLUCOSE 95 11/09/2014   CHOL 143 10/08/2014   TRIG 88 10/08/2014   HDL 36* 10/08/2014   LDLCALC 89 10/08/2014   ALT 41 11/16/2014   AST 23 11/16/2014   NA 132* 11/09/2014   K 4.3 11/09/2014   CL 100 11/09/2014   CREATININE 1.35 11/09/2014   BUN 22 11/09/2014   CO2 22 11/09/2014   TSH 3.043 10/07/2014   INR 1.08 10/12/2014   HGBA1C 6.0* 10/09/2014    BNP (last 3 results) No results for input(s): BNP in the last 8760 hours.  ProBNP (last 3 results) No results for input(s): PROBNP in the last 8760 hours.   Other Studies Reviewed Today: Study Conclusions  - Left ventricle: Septal apical and distal anterior wall  hypokinesis. The cavity size was mildly dilated. Wall thickness  was normal. The estimated ejection fraction was 45%. - Left atrium: The atrium was mildly dilated. -  Atrial septum: There was increased thickness of the septum,  consistent with lipomatous hypertrophy. No defect or patent  foramen ovale was identified.   Assessment/Plan:  Ischemic cardiomyopathy  Last Echo demonstrated improved LVF with EF now up to 45%. He has no symptoms. Continue beta-blocker. His blood pressure is still too low to add ACE inhibitor.   Hypotension, unspecified hypotension type His blood pressure is still too low to add ACE inhibitor again - he will stay on his current regimen.   Coronary artery disease S/P CABG (coronary artery bypass graft) on 10/08/2014 Continues to progress well since his CABG. No symptoms at this time. Continue ASA, statin, beta-blocker.Continue with CV risk factor modification.   Hyperlipidemia Continue statin. Labs by PCP.Seeing PCP  later this month.   Current medicines are reviewed with the patient today.  The patient does not have concerns regarding medicines other than what has been noted above.  The following changes have been made:  See above.  Labs/ tests ordered today include:   No orders of the defined types were placed in this encounter.     Disposition:   FU with Dr. Duke Salvia in 6 months.   Patient is agreeable to this plan and will call if any problems develop in the interim.   Signed: Rosalio Macadamia, RN, ANP-C 11/23/2015 8:08 AM  [facility] [address] [place], [place]  [address] Phone: [phone] Fax: [phone]

## 2015-11-24 ENCOUNTER — Encounter (HOSPITAL_COMMUNITY)
Admission: RE | Admit: 2015-11-24 | Discharge: 2015-11-24 | Disposition: A | Discharge status: Home / Self Care | Payer: Self-pay | Source: Ambulatory Visit | Attending: Nurse Practitioner | Admitting: Nurse Practitioner

## 2015-11-26 ENCOUNTER — Encounter (HOSPITAL_COMMUNITY)
Admission: RE | Admit: 2015-11-26 | Discharge: 2015-11-26 | Disposition: A | Discharge status: Home / Self Care | Payer: Self-pay | Source: Ambulatory Visit | Attending: Nurse Practitioner | Admitting: Nurse Practitioner

## 2015-11-26 ENCOUNTER — Other Ambulatory Visit: Payer: Self-pay | Admitting: Physician Assistant

## 2015-11-29 ENCOUNTER — Encounter (HOSPITAL_COMMUNITY)
Admission: RE | Admit: 2015-11-29 | Discharge: 2015-11-29 | Disposition: A | Discharge status: Home / Self Care | Payer: Self-pay | Source: Ambulatory Visit | Attending: Nurse Practitioner | Admitting: Nurse Practitioner

## 2015-12-01 ENCOUNTER — Encounter (HOSPITAL_COMMUNITY): Payer: Self-pay

## 2015-12-03 ENCOUNTER — Encounter (HOSPITAL_COMMUNITY)
Admission: RE | Admit: 2015-12-03 | Discharge: 2015-12-03 | Disposition: A | Discharge status: Home / Self Care | Payer: Self-pay | Source: Ambulatory Visit | Attending: Nurse Practitioner | Admitting: Nurse Practitioner

## 2015-12-06 ENCOUNTER — Encounter (HOSPITAL_COMMUNITY)
Admission: RE | Admit: 2015-12-06 | Discharge: 2015-12-06 | Disposition: A | Discharge status: Home / Self Care | Payer: Self-pay | Source: Ambulatory Visit | Attending: Nurse Practitioner | Admitting: Nurse Practitioner

## 2015-12-08 ENCOUNTER — Encounter (HOSPITAL_COMMUNITY)
Admission: RE | Admit: 2015-12-08 | Discharge: 2015-12-08 | Disposition: A | Discharge status: Home / Self Care | Payer: Self-pay | Source: Ambulatory Visit | Attending: Nurse Practitioner | Admitting: Nurse Practitioner

## 2015-12-10 ENCOUNTER — Encounter (HOSPITAL_COMMUNITY)
Admission: RE | Admit: 2015-12-10 | Discharge: 2015-12-10 | Disposition: A | Discharge status: Home / Self Care | Payer: Self-pay | Source: Ambulatory Visit | Attending: Nurse Practitioner | Admitting: Nurse Practitioner

## 2015-12-13 ENCOUNTER — Encounter (HOSPITAL_COMMUNITY)
Admission: RE | Admit: 2015-12-13 | Discharge: 2015-12-13 | Disposition: A | Discharge status: Home / Self Care | Payer: Self-pay | Source: Ambulatory Visit | Attending: Nurse Practitioner | Admitting: Nurse Practitioner

## 2015-12-15 ENCOUNTER — Encounter (HOSPITAL_COMMUNITY)
Admission: RE | Admit: 2015-12-15 | Discharge: 2015-12-15 | Disposition: A | Discharge status: Home / Self Care | Payer: Self-pay | Source: Ambulatory Visit | Attending: Nurse Practitioner | Admitting: Nurse Practitioner

## 2015-12-17 ENCOUNTER — Encounter (HOSPITAL_COMMUNITY)
Admission: RE | Admit: 2015-12-17 | Discharge: 2015-12-17 | Disposition: A | Discharge status: Home / Self Care | Payer: Self-pay | Source: Ambulatory Visit | Attending: Nurse Practitioner | Admitting: Nurse Practitioner

## 2015-12-22 ENCOUNTER — Encounter (HOSPITAL_COMMUNITY)
Admission: RE | Admit: 2015-12-22 | Discharge: 2015-12-22 | Disposition: A | Discharge status: Home / Self Care | Payer: Self-pay | Source: Ambulatory Visit | Attending: Nurse Practitioner | Admitting: Nurse Practitioner

## 2015-12-24 ENCOUNTER — Encounter (HOSPITAL_COMMUNITY)
Admission: RE | Admit: 2015-12-24 | Discharge: 2015-12-24 | Disposition: A | Discharge status: Home / Self Care | Payer: Self-pay | Source: Ambulatory Visit | Attending: Nurse Practitioner | Admitting: Nurse Practitioner

## 2015-12-24 DIAGNOSIS — Z951 Presence of aortocoronary bypass graft: Secondary | ICD-10-CM | POA: Insufficient documentation

## 2015-12-27 ENCOUNTER — Encounter (HOSPITAL_COMMUNITY)
Admission: RE | Admit: 2015-12-27 | Discharge: 2015-12-27 | Disposition: A | Discharge status: Home / Self Care | Payer: Self-pay | Source: Ambulatory Visit | Attending: Nurse Practitioner | Admitting: Nurse Practitioner

## 2015-12-29 ENCOUNTER — Encounter (HOSPITAL_COMMUNITY)
Admission: RE | Admit: 2015-12-29 | Discharge: 2015-12-29 | Disposition: A | Discharge status: Home / Self Care | Payer: Self-pay | Source: Ambulatory Visit | Attending: Nurse Practitioner | Admitting: Nurse Practitioner

## 2015-12-31 ENCOUNTER — Encounter (HOSPITAL_COMMUNITY)
Admission: RE | Admit: 2015-12-31 | Discharge: 2015-12-31 | Disposition: A | Discharge status: Home / Self Care | Payer: Self-pay | Source: Ambulatory Visit | Attending: Nurse Practitioner | Admitting: Nurse Practitioner

## 2016-01-03 ENCOUNTER — Encounter (HOSPITAL_COMMUNITY)
Admission: RE | Admit: 2016-01-03 | Discharge: 2016-01-03 | Disposition: A | Discharge status: Home / Self Care | Payer: Self-pay | Source: Ambulatory Visit | Attending: Nurse Practitioner | Admitting: Nurse Practitioner

## 2016-01-05 ENCOUNTER — Encounter (HOSPITAL_COMMUNITY)
Admission: RE | Admit: 2016-01-05 | Discharge: 2016-01-05 | Disposition: A | Discharge status: Home / Self Care | Payer: Self-pay | Source: Ambulatory Visit | Attending: Cardiovascular Disease | Admitting: Cardiovascular Disease

## 2016-01-07 ENCOUNTER — Encounter (HOSPITAL_COMMUNITY)
Admission: RE | Admit: 2016-01-07 | Discharge: 2016-01-07 | Disposition: A | Discharge status: Home / Self Care | Payer: Self-pay | Source: Ambulatory Visit | Attending: Nurse Practitioner | Admitting: Nurse Practitioner

## 2016-01-10 ENCOUNTER — Encounter (HOSPITAL_COMMUNITY)
Admission: RE | Admit: 2016-01-10 | Discharge: 2016-01-10 | Disposition: A | Discharge status: Home / Self Care | Payer: Self-pay | Source: Ambulatory Visit | Attending: Nurse Practitioner | Admitting: Nurse Practitioner

## 2016-01-12 ENCOUNTER — Encounter (HOSPITAL_COMMUNITY)
Admission: RE | Admit: 2016-01-12 | Discharge: 2016-01-12 | Disposition: A | Discharge status: Home / Self Care | Payer: Self-pay | Source: Ambulatory Visit | Attending: Nurse Practitioner | Admitting: Nurse Practitioner

## 2016-01-14 ENCOUNTER — Encounter (HOSPITAL_COMMUNITY)
Admission: RE | Admit: 2016-01-14 | Discharge: 2016-01-14 | Disposition: A | Discharge status: Home / Self Care | Payer: Self-pay | Source: Ambulatory Visit | Attending: Nurse Practitioner | Admitting: Nurse Practitioner

## 2016-01-17 ENCOUNTER — Encounter (HOSPITAL_COMMUNITY)
Admission: RE | Admit: 2016-01-17 | Discharge: 2016-01-17 | Disposition: A | Discharge status: Home / Self Care | Payer: Self-pay | Source: Ambulatory Visit | Attending: Nurse Practitioner | Admitting: Nurse Practitioner

## 2016-01-19 ENCOUNTER — Encounter (HOSPITAL_COMMUNITY)
Admission: RE | Admit: 2016-01-19 | Discharge: 2016-01-19 | Disposition: A | Discharge status: Home / Self Care | Payer: Self-pay | Source: Ambulatory Visit | Attending: Nurse Practitioner | Admitting: Nurse Practitioner

## 2016-01-21 ENCOUNTER — Encounter (HOSPITAL_COMMUNITY)
Admission: RE | Admit: 2016-01-21 | Discharge: 2016-01-21 | Disposition: A | Discharge status: Home / Self Care | Payer: Self-pay | Source: Ambulatory Visit | Attending: Nurse Practitioner | Admitting: Nurse Practitioner

## 2016-01-24 ENCOUNTER — Encounter (HOSPITAL_COMMUNITY)
Admission: RE | Admit: 2016-01-24 | Discharge: 2016-01-24 | Disposition: A | Discharge status: Home / Self Care | Payer: Self-pay | Source: Ambulatory Visit | Attending: Nurse Practitioner | Admitting: Nurse Practitioner

## 2016-01-24 DIAGNOSIS — Z951 Presence of aortocoronary bypass graft: Secondary | ICD-10-CM | POA: Insufficient documentation

## 2016-01-26 ENCOUNTER — Encounter (HOSPITAL_COMMUNITY)
Admission: RE | Admit: 2016-01-26 | Discharge: 2016-01-26 | Disposition: A | Discharge status: Home / Self Care | Payer: Self-pay | Source: Ambulatory Visit | Attending: Nurse Practitioner | Admitting: Nurse Practitioner

## 2016-01-28 ENCOUNTER — Encounter (HOSPITAL_COMMUNITY)
Admission: RE | Admit: 2016-01-28 | Discharge: 2016-01-28 | Disposition: A | Discharge status: Home / Self Care | Payer: Self-pay | Source: Ambulatory Visit | Attending: Nurse Practitioner | Admitting: Nurse Practitioner

## 2016-01-31 ENCOUNTER — Encounter (HOSPITAL_COMMUNITY)
Admission: RE | Admit: 2016-01-31 | Discharge: 2016-01-31 | Disposition: A | Discharge status: Home / Self Care | Payer: Self-pay | Source: Ambulatory Visit | Attending: Nurse Practitioner | Admitting: Nurse Practitioner

## 2016-02-02 ENCOUNTER — Encounter (HOSPITAL_COMMUNITY)
Admission: RE | Admit: 2016-02-02 | Discharge: 2016-02-02 | Disposition: A | Discharge status: Home / Self Care | Payer: Self-pay | Source: Ambulatory Visit | Attending: Nurse Practitioner | Admitting: Nurse Practitioner

## 2016-02-04 ENCOUNTER — Encounter (HOSPITAL_COMMUNITY)
Admission: RE | Admit: 2016-02-04 | Discharge: 2016-02-04 | Disposition: A | Discharge status: Home / Self Care | Payer: Self-pay | Source: Ambulatory Visit | Attending: Nurse Practitioner | Admitting: Nurse Practitioner

## 2016-02-07 ENCOUNTER — Encounter (HOSPITAL_COMMUNITY)
Admission: RE | Admit: 2016-02-07 | Discharge: 2016-02-07 | Disposition: A | Discharge status: Home / Self Care | Payer: Self-pay | Source: Ambulatory Visit | Attending: Nurse Practitioner | Admitting: Nurse Practitioner

## 2016-02-09 ENCOUNTER — Encounter (HOSPITAL_COMMUNITY)
Admission: RE | Admit: 2016-02-09 | Discharge: 2016-02-09 | Disposition: A | Discharge status: Home / Self Care | Payer: Self-pay | Source: Ambulatory Visit | Attending: Nurse Practitioner | Admitting: Nurse Practitioner

## 2016-02-11 ENCOUNTER — Encounter (HOSPITAL_COMMUNITY)
Admission: RE | Admit: 2016-02-11 | Discharge: 2016-02-11 | Disposition: A | Discharge status: Home / Self Care | Payer: Self-pay | Source: Ambulatory Visit | Attending: Nurse Practitioner | Admitting: Nurse Practitioner

## 2016-02-14 ENCOUNTER — Encounter (HOSPITAL_COMMUNITY)
Admission: RE | Admit: 2016-02-14 | Discharge: 2016-02-14 | Disposition: A | Discharge status: Home / Self Care | Payer: Self-pay | Source: Ambulatory Visit | Attending: Nurse Practitioner | Admitting: Nurse Practitioner

## 2016-02-16 ENCOUNTER — Encounter (HOSPITAL_COMMUNITY)
Admission: RE | Admit: 2016-02-16 | Discharge: 2016-02-16 | Disposition: A | Discharge status: Home / Self Care | Payer: Self-pay | Source: Ambulatory Visit | Attending: Nurse Practitioner | Admitting: Nurse Practitioner

## 2016-02-18 ENCOUNTER — Encounter (HOSPITAL_COMMUNITY)
Admission: RE | Admit: 2016-02-18 | Discharge: 2016-02-18 | Disposition: A | Discharge status: Home / Self Care | Payer: Self-pay | Source: Ambulatory Visit | Attending: Nurse Practitioner | Admitting: Nurse Practitioner

## 2016-02-21 ENCOUNTER — Encounter (HOSPITAL_COMMUNITY)
Admission: RE | Admit: 2016-02-21 | Discharge: 2016-02-21 | Disposition: A | Discharge status: Home / Self Care | Payer: Self-pay | Source: Ambulatory Visit | Attending: Nurse Practitioner | Admitting: Nurse Practitioner

## 2016-02-23 ENCOUNTER — Encounter (HOSPITAL_COMMUNITY)
Admission: RE | Admit: 2016-02-23 | Discharge: 2016-02-23 | Disposition: A | Discharge status: Home / Self Care | Payer: Self-pay | Source: Ambulatory Visit | Attending: Nurse Practitioner | Admitting: Nurse Practitioner

## 2016-02-23 DIAGNOSIS — Z951 Presence of aortocoronary bypass graft: Secondary | ICD-10-CM | POA: Insufficient documentation

## 2016-02-25 ENCOUNTER — Encounter (HOSPITAL_COMMUNITY)
Admission: RE | Admit: 2016-02-25 | Discharge: 2016-02-25 | Disposition: A | Discharge status: Home / Self Care | Payer: Self-pay | Source: Ambulatory Visit | Attending: Nurse Practitioner | Admitting: Nurse Practitioner

## 2016-02-28 ENCOUNTER — Encounter (HOSPITAL_COMMUNITY)
Admission: RE | Admit: 2016-02-28 | Discharge: 2016-02-28 | Disposition: A | Discharge status: Home / Self Care | Payer: Self-pay | Source: Ambulatory Visit | Attending: Nurse Practitioner | Admitting: Nurse Practitioner

## 2016-03-01 ENCOUNTER — Encounter (HOSPITAL_COMMUNITY)
Admission: RE | Admit: 2016-03-01 | Discharge: 2016-03-01 | Disposition: A | Discharge status: Home / Self Care | Payer: Self-pay | Source: Ambulatory Visit | Attending: Nurse Practitioner | Admitting: Nurse Practitioner

## 2016-03-03 ENCOUNTER — Encounter (HOSPITAL_COMMUNITY)
Admission: RE | Admit: 2016-03-03 | Discharge: 2016-03-03 | Disposition: A | Discharge status: Home / Self Care | Payer: Self-pay | Source: Ambulatory Visit | Attending: Nurse Practitioner | Admitting: Nurse Practitioner

## 2016-03-06 ENCOUNTER — Encounter (HOSPITAL_COMMUNITY): Payer: Self-pay

## 2016-03-08 ENCOUNTER — Encounter (HOSPITAL_COMMUNITY): Payer: Self-pay

## 2016-03-10 ENCOUNTER — Encounter (HOSPITAL_COMMUNITY): Payer: Self-pay

## 2016-03-13 ENCOUNTER — Encounter (HOSPITAL_COMMUNITY)
Admission: RE | Admit: 2016-03-13 | Discharge: 2016-03-13 | Disposition: A | Discharge status: Home / Self Care | Payer: Self-pay | Source: Ambulatory Visit | Attending: Nurse Practitioner | Admitting: Nurse Practitioner

## 2016-03-15 ENCOUNTER — Encounter (HOSPITAL_COMMUNITY)
Admission: RE | Admit: 2016-03-15 | Discharge: 2016-03-15 | Disposition: A | Discharge status: Home / Self Care | Payer: Self-pay | Source: Ambulatory Visit | Attending: Nurse Practitioner | Admitting: Nurse Practitioner

## 2016-03-17 ENCOUNTER — Encounter (HOSPITAL_COMMUNITY)
Admission: RE | Admit: 2016-03-17 | Discharge: 2016-03-17 | Disposition: A | Discharge status: Home / Self Care | Payer: Self-pay | Source: Ambulatory Visit | Attending: Nurse Practitioner | Admitting: Nurse Practitioner

## 2016-03-20 ENCOUNTER — Encounter (HOSPITAL_COMMUNITY)
Admission: RE | Admit: 2016-03-20 | Discharge: 2016-03-20 | Disposition: A | Discharge status: Home / Self Care | Payer: Self-pay | Source: Ambulatory Visit | Attending: Nurse Practitioner | Admitting: Nurse Practitioner

## 2016-03-22 ENCOUNTER — Encounter (HOSPITAL_COMMUNITY)
Admission: RE | Admit: 2016-03-22 | Discharge: 2016-03-22 | Disposition: A | Discharge status: Home / Self Care | Payer: Self-pay | Source: Ambulatory Visit | Attending: Nurse Practitioner | Admitting: Nurse Practitioner

## 2016-03-24 ENCOUNTER — Encounter (HOSPITAL_COMMUNITY)
Admission: RE | Admit: 2016-03-24 | Discharge: 2016-03-24 | Disposition: A | Discharge status: Home / Self Care | Payer: Self-pay | Source: Ambulatory Visit | Attending: Nurse Practitioner | Admitting: Nurse Practitioner

## 2016-03-24 DIAGNOSIS — Z951 Presence of aortocoronary bypass graft: Secondary | ICD-10-CM | POA: Insufficient documentation

## 2016-03-29 ENCOUNTER — Encounter (HOSPITAL_COMMUNITY)
Admission: RE | Admit: 2016-03-29 | Discharge: 2016-03-29 | Disposition: A | Discharge status: Home / Self Care | Payer: Self-pay | Source: Ambulatory Visit | Attending: Nurse Practitioner | Admitting: Nurse Practitioner

## 2016-03-31 ENCOUNTER — Encounter (HOSPITAL_COMMUNITY)
Admission: RE | Admit: 2016-03-31 | Discharge: 2016-03-31 | Disposition: A | Discharge status: Home / Self Care | Payer: Self-pay | Source: Ambulatory Visit | Attending: Nurse Practitioner | Admitting: Nurse Practitioner

## 2016-04-03 ENCOUNTER — Encounter (HOSPITAL_COMMUNITY)
Admission: RE | Admit: 2016-04-03 | Discharge: 2016-04-03 | Disposition: A | Discharge status: Home / Self Care | Payer: Self-pay | Source: Ambulatory Visit | Attending: Nurse Practitioner | Admitting: Nurse Practitioner

## 2016-04-05 ENCOUNTER — Encounter (HOSPITAL_COMMUNITY)
Admission: RE | Admit: 2016-04-05 | Discharge: 2016-04-05 | Disposition: A | Discharge status: Home / Self Care | Payer: Self-pay | Source: Ambulatory Visit | Attending: Nurse Practitioner | Admitting: Nurse Practitioner

## 2016-04-07 ENCOUNTER — Encounter (HOSPITAL_COMMUNITY): Payer: Self-pay

## 2016-04-10 ENCOUNTER — Encounter (HOSPITAL_COMMUNITY)
Admission: RE | Admit: 2016-04-10 | Discharge: 2016-04-10 | Disposition: A | Discharge status: Home / Self Care | Payer: Self-pay | Source: Ambulatory Visit | Attending: Nurse Practitioner | Admitting: Nurse Practitioner

## 2016-04-12 ENCOUNTER — Encounter (HOSPITAL_COMMUNITY)
Admission: RE | Admit: 2016-04-12 | Discharge: 2016-04-12 | Disposition: A | Discharge status: Home / Self Care | Payer: Self-pay | Source: Ambulatory Visit | Attending: Nurse Practitioner | Admitting: Nurse Practitioner

## 2016-04-14 ENCOUNTER — Encounter (HOSPITAL_COMMUNITY): Payer: Self-pay

## 2016-04-17 ENCOUNTER — Encounter (HOSPITAL_COMMUNITY)
Admission: RE | Admit: 2016-04-17 | Discharge: 2016-04-17 | Disposition: A | Discharge status: Home / Self Care | Payer: Self-pay | Source: Ambulatory Visit | Attending: Nurse Practitioner | Admitting: Nurse Practitioner

## 2016-04-19 ENCOUNTER — Encounter (HOSPITAL_COMMUNITY)
Admission: RE | Admit: 2016-04-19 | Discharge: 2016-04-19 | Disposition: A | Discharge status: Home / Self Care | Payer: Self-pay | Source: Ambulatory Visit | Attending: Nurse Practitioner | Admitting: Nurse Practitioner

## 2016-04-21 ENCOUNTER — Encounter (HOSPITAL_COMMUNITY)
Admission: RE | Admit: 2016-04-21 | Discharge: 2016-04-21 | Disposition: A | Discharge status: Home / Self Care | Payer: Self-pay | Source: Ambulatory Visit | Attending: Nurse Practitioner | Admitting: Nurse Practitioner

## 2016-04-24 ENCOUNTER — Encounter (HOSPITAL_COMMUNITY)
Admission: RE | Admit: 2016-04-24 | Discharge: 2016-04-24 | Disposition: A | Discharge status: Home / Self Care | Payer: Self-pay | Source: Ambulatory Visit | Attending: Cardiovascular Disease | Admitting: Cardiovascular Disease

## 2016-04-24 DIAGNOSIS — Z951 Presence of aortocoronary bypass graft: Secondary | ICD-10-CM | POA: Insufficient documentation

## 2016-04-26 ENCOUNTER — Encounter (HOSPITAL_COMMUNITY)
Admission: RE | Admit: 2016-04-26 | Discharge: 2016-04-26 | Disposition: A | Discharge status: Home / Self Care | Payer: Self-pay | Source: Ambulatory Visit | Attending: Cardiovascular Disease | Admitting: Cardiovascular Disease

## 2016-05-02 ENCOUNTER — Telehealth (HOSPITAL_COMMUNITY): Payer: Self-pay | Admitting: *Deleted

## 2016-05-05 ENCOUNTER — Encounter (HOSPITAL_COMMUNITY)
Admission: RE | Admit: 2016-05-05 | Discharge: 2016-05-05 | Disposition: A | Discharge status: Home / Self Care | Payer: Self-pay | Source: Ambulatory Visit | Attending: Cardiovascular Disease | Admitting: Cardiovascular Disease

## 2016-05-08 ENCOUNTER — Encounter (HOSPITAL_COMMUNITY)
Admission: RE | Admit: 2016-05-08 | Discharge: 2016-05-08 | Disposition: A | Discharge status: Home / Self Care | Payer: Self-pay | Source: Ambulatory Visit | Attending: Cardiovascular Disease | Admitting: Cardiovascular Disease

## 2016-05-10 ENCOUNTER — Encounter (HOSPITAL_COMMUNITY): Payer: Self-pay

## 2016-05-12 ENCOUNTER — Encounter (HOSPITAL_COMMUNITY)
Admission: RE | Admit: 2016-05-12 | Discharge: 2016-05-12 | Disposition: A | Discharge status: Home / Self Care | Payer: Self-pay | Source: Ambulatory Visit | Attending: Cardiovascular Disease | Admitting: Cardiovascular Disease

## 2016-05-12 ENCOUNTER — Encounter (HOSPITAL_COMMUNITY): Payer: Self-pay | Admitting: *Deleted

## 2016-05-15 ENCOUNTER — Encounter (HOSPITAL_COMMUNITY)
Admission: RE | Admit: 2016-05-15 | Discharge: 2016-05-15 | Disposition: A | Discharge status: Home / Self Care | Payer: Self-pay | Source: Ambulatory Visit | Attending: Cardiovascular Disease | Admitting: Cardiovascular Disease

## 2016-05-16 ENCOUNTER — Other Ambulatory Visit: Payer: Self-pay | Admitting: *Deleted

## 2016-05-17 ENCOUNTER — Encounter (HOSPITAL_COMMUNITY)
Admission: RE | Admit: 2016-05-17 | Discharge: 2016-05-17 | Disposition: A | Discharge status: Home / Self Care | Payer: Self-pay | Source: Ambulatory Visit | Attending: Cardiovascular Disease | Admitting: Cardiovascular Disease

## 2016-05-19 ENCOUNTER — Encounter (HOSPITAL_COMMUNITY)
Admission: RE | Admit: 2016-05-19 | Discharge: 2016-05-19 | Disposition: A | Discharge status: Home / Self Care | Payer: Self-pay | Source: Ambulatory Visit | Attending: Cardiovascular Disease | Admitting: Cardiovascular Disease

## 2016-05-22 ENCOUNTER — Encounter (HOSPITAL_COMMUNITY)
Admission: RE | Admit: 2016-05-22 | Discharge: 2016-05-22 | Disposition: A | Discharge status: Home / Self Care | Payer: Self-pay | Source: Ambulatory Visit | Attending: Cardiovascular Disease | Admitting: Cardiovascular Disease

## 2016-05-24 ENCOUNTER — Encounter (HOSPITAL_COMMUNITY)
Admission: RE | Admit: 2016-05-24 | Discharge: 2016-05-24 | Disposition: A | Discharge status: Home / Self Care | Payer: Self-pay | Source: Ambulatory Visit | Attending: Cardiovascular Disease | Admitting: Cardiovascular Disease

## 2016-05-24 DIAGNOSIS — Z951 Presence of aortocoronary bypass graft: Secondary | ICD-10-CM | POA: Insufficient documentation

## 2016-05-26 ENCOUNTER — Encounter (HOSPITAL_COMMUNITY)
Admission: RE | Admit: 2016-05-26 | Discharge: 2016-05-26 | Disposition: A | Discharge status: Home / Self Care | Payer: Self-pay | Source: Ambulatory Visit | Attending: Cardiovascular Disease | Admitting: Cardiovascular Disease

## 2016-05-29 ENCOUNTER — Encounter (HOSPITAL_COMMUNITY)
Admission: RE | Admit: 2016-05-29 | Discharge: 2016-05-29 | Disposition: A | Discharge status: Home / Self Care | Payer: Self-pay | Source: Ambulatory Visit | Attending: Cardiovascular Disease | Admitting: Cardiovascular Disease

## 2016-05-30 ENCOUNTER — Ambulatory Visit (INDEPENDENT_AMBULATORY_CARE_PROVIDER_SITE_OTHER): Payer: Medicare Other | Admitting: Cardiovascular Disease

## 2016-05-30 ENCOUNTER — Encounter: Payer: Self-pay | Admitting: Cardiovascular Disease

## 2016-05-30 ENCOUNTER — Encounter (INDEPENDENT_AMBULATORY_CARE_PROVIDER_SITE_OTHER): Payer: Self-pay

## 2016-05-30 VITALS — BP 122/68 | HR 68 | Ht 66.0 in | Wt 159.6 lb

## 2016-05-30 DIAGNOSIS — I10 Essential (primary) hypertension: Secondary | ICD-10-CM

## 2016-05-30 DIAGNOSIS — E7800 Pure hypercholesterolemia, unspecified: Secondary | ICD-10-CM | POA: Diagnosis not present

## 2016-05-30 DIAGNOSIS — I5042 Chronic combined systolic (congestive) and diastolic (congestive) heart failure: Secondary | ICD-10-CM

## 2016-05-30 DIAGNOSIS — Z951 Presence of aortocoronary bypass graft: Secondary | ICD-10-CM

## 2016-05-30 NOTE — Patient Instructions (Signed)
Medication Instructions:  Your physician recommends that you continue on your current medications as directed. Please refer to the Current Medication list given to you today.  Labwork: none  Testing/Procedures: none  Follow-Up: Your physician wants you to follow-up in: 6 month ov with NP/PA You will receive a reminder letter in the mail two months in advance. If you don't receive a letter, please call our office to schedule the follow-up appointment.  Your physician wants you to follow-up in: 1 year ov  You will receive a reminder letter in the mail two months in advance. If you don't receive a letter, please call our office to schedule the follow-up appointment.  If you need a refill on your cardiac medications before your next appointment, please call your pharmacy.

## 2016-05-30 NOTE — Progress Notes (Signed)
Cardiology Office Note   Date:  05/30/2016   ID:  Anthony Vasquez, DOB 1946-06-03, MRN [id number]  PCP:  Junious SilkALTHEIMER,MICHAEL D, MD  Cardiologist:   Chilton Siiffany Mantoloking, MD   Chief Complaint  Patient presents with  . New Evaluation    former Dr. Patty SermonsBrackbill patient, pt has no complaints today       History of Present Illness: Anthony Vasquez is a 70 y.o. male with Chronic systolic and diastolic heart failure (LVEF improved from 15% to 45%) , hypertension, hyperlipidemia, CAD s/p CABG who presents for follow up.  Anthony Vasquez was previously a patient of Dr. Patty SermonsBrackbill.  He was admitted 09/2014 with NSTEMI.  He had a LHC that demonstrated LVEF 15-20% and 3 vessel CAD. He underwent coronary artery bypass grafting with Dr. Donata ClayVan Trigt ( (L-LAD, S-RI, S-OM).  Subsequent echo/2016 revealed improvement in his EF to 35-40%. He had a subsequent echo 01/2015 that revealed LVEF 45%. He last saw Norma FredricksonLori Gerhardt 11/2015 and was doing well. He was not started on an ACE inhibitor due to hypotension.  Anthony Vasquez continues to do well. He continues to [facility] per week. He also walks for 2 miles on the other days. He is able to complete 2 miles in 30 minutes. He takes some days off. He has no chest pain or shortness of breath with this activity. He also denies lower extremity edema, orthopnea, or PND. He sometimes notes palpitations when laying in bed at night. There is no associated lightheadedness, shortness of breath or dizziness. The episodes last for a few seconds at a time. He tries to eat a healthy diet and drinks mostly water throughout the day.   Past Medical History:  Diagnosis Date  . Hypertension   . Ischemic cardiomyopathy    Echo 4/16: Mild LVH, EF 35-40%, anteroseptal, apical, inferior, apical anterior anteroapical akinesis, grade 1 diastolic dysfunction, mildly reduced RVSF  . Myocardial infarction 09/2014   nstemi  . Renal disorder     Past Surgical  History:  Procedure Laterality Date  . COLONOSCOPY  2015  . CORONARY ARTERY BYPASS GRAFT N/A 10/08/2014   Procedure: CORONARY ARTERY BYPASS GRAFTING (CABG);  Surgeon: Kerin PernaPeter Van Trigt, MD;  [facility];  Service: Open Heart Surgery;  Laterality: N/A;  PATIENT IS GETTING AN IMPELLA IN THE CATH LAB CABG times 3 using left internal mammary artery and endoscopically harvested left saphenous vein.  Marland Kitchen. LEFT HEART CATHETERIZATION WITH CORONARY ANGIOGRAM N/A 10/08/2014   Procedure: LEFT HEART CATHETERIZATION WITH CORONARY ANGIOGRAM;  Surgeon: Corky CraftsJayadeep S Varanasi, MD;  [facility];  Service: Cardiovascular;  Laterality: N/A;  . TEE WITHOUT CARDIOVERSION N/A 10/08/2014   Procedure: TRANSESOPHAGEAL ECHOCARDIOGRAM (TEE);  Surgeon: Kerin PernaPeter Van Trigt, MD;  [facility];  Service: Open Heart Surgery;  Laterality: N/A;  . TONSILLECTOMY       Current Outpatient Prescriptions  Medication Sig Dispense Refill  . aspirin 81 MG tablet Take 81 mg by mouth daily.    . metoprolol tartrate (LOPRESSOR) 25 MG tablet TAKE 1/2 TABLET BY MOUTH TWICE DAILY 30 tablet 11  . pravastatin (PRAVACHOL) 80 MG tablet Take 80 mg by mouth daily.    . Vitamin D, Ergocalciferol, (DRISDOL) 50000 UNITS CAPS capsule Take 50,000 Units by mouth every 14 (fourteen) days.     No current facility-administered medications for this visit.     Allergies:   Patient has no known allergies.    Social  History:  The patient  reports that he has never smoked. He has never used smokeless tobacco. He reports that he does not drink alcohol or use drugs.   Family History:  The patient's family history includes Healthy in his brother; Heart attack in his mother; Heart disease in his father and mother.    ROS:  Please see the history of present illness.   Otherwise, review of systems are positive for none.   All other systems are reviewed and negative.    PHYSICAL EXAM: VS:  BP 122/68 (BP Location: Right Arm, Patient Position: Sitting,  Cuff Size: Normal)   Pulse 68   Ht 5\' 6"  (1.676 m)   Wt 72.4 kg (159 lb 9.6 oz)   SpO2 97%   BMI 25.76 kg/m  , BMI Body mass index is 25.76 kg/m. GENERAL:  Well appearing HEENT:  Pupils equal round and reactive, fundi not visualized, oral mucosa unremarkable NECK:  No jugular venous distention, waveform within normal limits, carotid upstroke brisk and symmetric, no bruits, no thyromegaly LYMPHATICS:  No cervical adenopathy LUNGS:  Clear to auscultation bilaterally HEART:  RRR.  PMI not displaced or sustained,S1 and S2 within normal limits, no S3, no S4, no clicks, no rubs, mp murmurs ABD:  Flat, positive bowel sounds normal in frequency in pitch, no bruits, no rebound, no guarding, no midline pulsatile mass, no hepatomegaly, no splenomegaly EXT:  2 plus pulses throughout, no edema, no cyanosis no clubbing SKIN:  No rashes no nodules NEURO:  Cranial nerves II through XII grossly intact, motor grossly intact throughout PSYCH:  Cognitively intact, oriented to person place and time    EKG:  EKG is ordered today. The ekg ordered today demonstrates sinus rhythm rate 68 bpm.  Prior anteroseptal infarct.     Recent Labs: No results found for requested labs within last 8760 hours.    Lipid Panel    Component Value Date/Time   CHOL 143 10/08/2014 0528   TRIG 88 10/08/2014 0528   HDL 36 (L) 10/08/2014 0528   CHOLHDL 4.0 10/08/2014 0528   VLDL 18 10/08/2014 0528   LDLCALC 89 10/08/2014 0528      Wt Readings from Last 3 Encounters:  05/30/16 72.4 kg (159 lb 9.6 oz)  11/23/15 70.2 kg (154 lb 12.8 oz)  05/24/15 68.9 kg (151 lb 12.8 oz)      ASSESSMENT AND PLAN:  # CAD s/p CABG: Anthony Vasquez is doing very well and has no symptoms with exercise.  Continue aspirin, pravastatin, and metoprolol.  # Chronic systolic and diastolic heart failure:  Anthony Vasquez is doing well.  He is euvolemic.  BP is too low for an ACE-I/ARB.  Continue metoprolol.  # Hypertension: Blood pressure is  well-controlled.  Continue metoprolol.  # Hyperlipidemia: Lipids recently checked with PCP and reportedly at goal.  LDL should be <70.   Current medicines are reviewed at length with the patient today.  The patient does not have concerns regarding medicines.  The following changes have been made:  no change  Labs/ tests ordered today include:  No orders of the defined types were placed in this encounter.    Disposition:   FU with Peggy Monk C. Duke Salvia, MD, [facility] in 1 year.  He will see one of our APPs in 6 months.     This note was written with the assistance of speech recognition software.  Please excuse any transcriptional errors.  Signed, Salil Raineri C. Duke Salvia, MD, [facility]  05/30/2016 2:32 PM  [facility]

## 2016-05-31 ENCOUNTER — Encounter (HOSPITAL_COMMUNITY)
Admission: RE | Admit: 2016-05-31 | Discharge: 2016-05-31 | Disposition: A | Discharge status: Home / Self Care | Payer: Self-pay | Source: Ambulatory Visit | Attending: Cardiovascular Disease | Admitting: Cardiovascular Disease

## 2016-06-02 ENCOUNTER — Encounter (HOSPITAL_COMMUNITY)
Admission: RE | Admit: 2016-06-02 | Discharge: 2016-06-02 | Disposition: A | Discharge status: Home / Self Care | Payer: Self-pay | Source: Ambulatory Visit | Attending: Cardiovascular Disease | Admitting: Cardiovascular Disease

## 2016-06-05 ENCOUNTER — Encounter (HOSPITAL_COMMUNITY)
Admission: RE | Admit: 2016-06-05 | Discharge: 2016-06-05 | Disposition: A | Discharge status: Home / Self Care | Payer: Self-pay | Source: Ambulatory Visit | Attending: Cardiovascular Disease | Admitting: Cardiovascular Disease

## 2016-06-07 ENCOUNTER — Encounter (HOSPITAL_COMMUNITY)
Admission: RE | Admit: 2016-06-07 | Discharge: 2016-06-07 | Disposition: A | Discharge status: Home / Self Care | Payer: Self-pay | Source: Ambulatory Visit | Attending: Cardiovascular Disease | Admitting: Cardiovascular Disease

## 2016-06-09 ENCOUNTER — Encounter (HOSPITAL_COMMUNITY)
Admission: RE | Admit: 2016-06-09 | Discharge: 2016-06-09 | Disposition: A | Discharge status: Home / Self Care | Payer: Self-pay | Source: Ambulatory Visit | Attending: Cardiovascular Disease | Admitting: Cardiovascular Disease

## 2016-06-12 ENCOUNTER — Encounter (HOSPITAL_COMMUNITY)
Admission: RE | Admit: 2016-06-12 | Discharge: 2016-06-12 | Disposition: A | Discharge status: Home / Self Care | Payer: Self-pay | Source: Ambulatory Visit | Attending: Cardiovascular Disease | Admitting: Cardiovascular Disease

## 2016-06-14 ENCOUNTER — Encounter (HOSPITAL_COMMUNITY)
Admission: RE | Admit: 2016-06-14 | Discharge: 2016-06-14 | Disposition: A | Discharge status: Home / Self Care | Payer: Self-pay | Source: Ambulatory Visit | Attending: Cardiovascular Disease | Admitting: Cardiovascular Disease

## 2016-06-19 ENCOUNTER — Encounter (HOSPITAL_COMMUNITY)
Admission: RE | Admit: 2016-06-19 | Discharge: 2016-06-19 | Disposition: A | Discharge status: Home / Self Care | Payer: Self-pay | Source: Ambulatory Visit | Attending: Cardiovascular Disease | Admitting: Cardiovascular Disease

## 2016-06-21 ENCOUNTER — Encounter (HOSPITAL_COMMUNITY)
Admission: RE | Admit: 2016-06-21 | Discharge: 2016-06-21 | Disposition: A | Discharge status: Home / Self Care | Payer: Self-pay | Source: Ambulatory Visit | Attending: Cardiovascular Disease | Admitting: Cardiovascular Disease

## 2016-06-23 ENCOUNTER — Encounter (HOSPITAL_COMMUNITY)
Admission: RE | Admit: 2016-06-23 | Discharge: 2016-06-23 | Disposition: A | Discharge status: Home / Self Care | Payer: Self-pay | Source: Ambulatory Visit | Attending: Cardiovascular Disease | Admitting: Cardiovascular Disease

## 2016-06-23 DIAGNOSIS — Z951 Presence of aortocoronary bypass graft: Secondary | ICD-10-CM | POA: Insufficient documentation

## 2016-06-26 ENCOUNTER — Encounter (HOSPITAL_COMMUNITY)
Admission: RE | Admit: 2016-06-26 | Discharge: 2016-06-26 | Disposition: A | Discharge status: Home / Self Care | Payer: Self-pay | Source: Ambulatory Visit | Attending: Cardiovascular Disease | Admitting: Cardiovascular Disease

## 2016-06-28 ENCOUNTER — Encounter (HOSPITAL_COMMUNITY)
Admission: RE | Admit: 2016-06-28 | Discharge: 2016-06-28 | Disposition: A | Discharge status: Home / Self Care | Payer: Self-pay | Source: Ambulatory Visit | Attending: Cardiovascular Disease | Admitting: Cardiovascular Disease

## 2016-06-30 ENCOUNTER — Encounter (HOSPITAL_COMMUNITY)
Admission: RE | Admit: 2016-06-30 | Discharge: 2016-06-30 | Disposition: A | Discharge status: Home / Self Care | Payer: Self-pay | Source: Ambulatory Visit | Attending: Cardiovascular Disease | Admitting: Cardiovascular Disease

## 2016-07-03 ENCOUNTER — Encounter (HOSPITAL_COMMUNITY)
Admission: RE | Admit: 2016-07-03 | Discharge: 2016-07-03 | Disposition: A | Discharge status: Home / Self Care | Payer: Self-pay | Source: Ambulatory Visit | Attending: Cardiovascular Disease | Admitting: Cardiovascular Disease

## 2016-07-05 ENCOUNTER — Encounter (HOSPITAL_COMMUNITY)
Admission: RE | Admit: 2016-07-05 | Discharge: 2016-07-05 | Disposition: A | Discharge status: Home / Self Care | Payer: Self-pay | Source: Ambulatory Visit | Attending: Cardiovascular Disease | Admitting: Cardiovascular Disease

## 2016-07-07 ENCOUNTER — Encounter (HOSPITAL_COMMUNITY)
Admission: RE | Admit: 2016-07-07 | Discharge: 2016-07-07 | Disposition: A | Discharge status: Home / Self Care | Payer: Self-pay | Source: Ambulatory Visit | Attending: Cardiovascular Disease | Admitting: Cardiovascular Disease

## 2016-07-10 ENCOUNTER — Encounter (HOSPITAL_COMMUNITY)
Admission: RE | Admit: 2016-07-10 | Discharge: 2016-07-10 | Disposition: A | Discharge status: Home / Self Care | Payer: Self-pay | Source: Ambulatory Visit | Attending: Cardiovascular Disease | Admitting: Cardiovascular Disease

## 2016-07-12 ENCOUNTER — Encounter (HOSPITAL_COMMUNITY)
Admission: RE | Admit: 2016-07-12 | Discharge: 2016-07-12 | Disposition: A | Discharge status: Home / Self Care | Payer: Self-pay | Source: Ambulatory Visit | Attending: Cardiovascular Disease | Admitting: Cardiovascular Disease

## 2016-07-14 ENCOUNTER — Encounter (HOSPITAL_COMMUNITY): Payer: Self-pay

## 2016-07-19 ENCOUNTER — Encounter (HOSPITAL_COMMUNITY)
Admission: RE | Admit: 2016-07-19 | Discharge: 2016-07-19 | Disposition: A | Discharge status: Home / Self Care | Payer: Self-pay | Source: Ambulatory Visit | Attending: Cardiovascular Disease | Admitting: Cardiovascular Disease

## 2016-07-21 ENCOUNTER — Encounter (HOSPITAL_COMMUNITY)
Admission: RE | Admit: 2016-07-21 | Discharge: 2016-07-21 | Disposition: A | Discharge status: Home / Self Care | Payer: Self-pay | Source: Ambulatory Visit | Attending: Cardiovascular Disease | Admitting: Cardiovascular Disease

## 2016-07-26 ENCOUNTER — Encounter (HOSPITAL_COMMUNITY): Payer: Self-pay

## 2016-07-26 DIAGNOSIS — Z951 Presence of aortocoronary bypass graft: Secondary | ICD-10-CM | POA: Insufficient documentation

## 2016-07-28 ENCOUNTER — Encounter (HOSPITAL_COMMUNITY): Payer: Self-pay

## 2016-07-31 ENCOUNTER — Encounter (HOSPITAL_COMMUNITY): Payer: Self-pay

## 2016-08-02 ENCOUNTER — Encounter (HOSPITAL_COMMUNITY)
Admission: RE | Admit: 2016-08-02 | Discharge: 2016-08-02 | Disposition: A | Discharge status: Home / Self Care | Payer: Self-pay | Source: Ambulatory Visit | Attending: Cardiovascular Disease | Admitting: Cardiovascular Disease

## 2016-08-04 ENCOUNTER — Encounter (HOSPITAL_COMMUNITY)
Admission: RE | Admit: 2016-08-04 | Discharge: 2016-08-04 | Disposition: A | Discharge status: Home / Self Care | Payer: Self-pay | Source: Ambulatory Visit | Attending: Cardiovascular Disease | Admitting: Cardiovascular Disease

## 2016-08-07 ENCOUNTER — Encounter (HOSPITAL_COMMUNITY): Payer: Self-pay

## 2016-08-09 ENCOUNTER — Encounter (HOSPITAL_COMMUNITY): Payer: Self-pay

## 2016-08-11 ENCOUNTER — Encounter (HOSPITAL_COMMUNITY): Payer: Self-pay

## 2016-08-14 ENCOUNTER — Encounter (HOSPITAL_COMMUNITY)
Admission: RE | Admit: 2016-08-14 | Discharge: 2016-08-14 | Disposition: A | Discharge status: Home / Self Care | Payer: Self-pay | Source: Ambulatory Visit | Attending: Cardiovascular Disease | Admitting: Cardiovascular Disease

## 2016-08-16 ENCOUNTER — Encounter (HOSPITAL_COMMUNITY)
Admission: RE | Admit: 2016-08-16 | Discharge: 2016-08-16 | Disposition: A | Discharge status: Home / Self Care | Payer: Self-pay | Source: Ambulatory Visit | Attending: Cardiovascular Disease | Admitting: Cardiovascular Disease

## 2016-08-18 ENCOUNTER — Encounter (HOSPITAL_COMMUNITY)
Admission: RE | Admit: 2016-08-18 | Discharge: 2016-08-18 | Disposition: A | Discharge status: Home / Self Care | Payer: Self-pay | Source: Ambulatory Visit | Attending: Cardiovascular Disease | Admitting: Cardiovascular Disease

## 2016-08-21 ENCOUNTER — Encounter (HOSPITAL_COMMUNITY)
Admission: RE | Admit: 2016-08-21 | Discharge: 2016-08-21 | Disposition: A | Discharge status: Home / Self Care | Payer: Self-pay | Source: Ambulatory Visit | Attending: Cardiovascular Disease | Admitting: Cardiovascular Disease

## 2016-08-23 ENCOUNTER — Encounter (HOSPITAL_COMMUNITY)
Admission: RE | Admit: 2016-08-23 | Discharge: 2016-08-23 | Disposition: A | Discharge status: Home / Self Care | Payer: Self-pay | Source: Ambulatory Visit | Attending: Cardiovascular Disease | Admitting: Cardiovascular Disease

## 2016-08-25 ENCOUNTER — Encounter (HOSPITAL_COMMUNITY)
Admission: RE | Admit: 2016-08-25 | Discharge: 2016-08-25 | Disposition: A | Discharge status: Home / Self Care | Payer: Medicare Other | Source: Ambulatory Visit | Attending: Cardiovascular Disease | Admitting: Cardiovascular Disease

## 2016-08-25 DIAGNOSIS — Z951 Presence of aortocoronary bypass graft: Secondary | ICD-10-CM | POA: Insufficient documentation

## 2016-08-28 ENCOUNTER — Encounter (HOSPITAL_COMMUNITY)
Admission: RE | Admit: 2016-08-28 | Discharge: 2016-08-28 | Disposition: A | Discharge status: Home / Self Care | Payer: Medicare Other | Source: Ambulatory Visit | Attending: Cardiovascular Disease | Admitting: Cardiovascular Disease

## 2016-08-30 ENCOUNTER — Encounter (HOSPITAL_COMMUNITY)
Admission: RE | Admit: 2016-08-30 | Discharge: 2016-08-30 | Disposition: A | Discharge status: Home / Self Care | Payer: Medicare Other | Source: Ambulatory Visit | Attending: Cardiovascular Disease | Admitting: Cardiovascular Disease

## 2016-09-01 ENCOUNTER — Encounter (HOSPITAL_COMMUNITY)
Admission: RE | Admit: 2016-09-01 | Discharge: 2016-09-01 | Disposition: A | Discharge status: Home / Self Care | Payer: Medicare Other | Source: Ambulatory Visit | Attending: Cardiovascular Disease | Admitting: Cardiovascular Disease

## 2016-09-04 ENCOUNTER — Encounter (HOSPITAL_COMMUNITY)
Admission: RE | Admit: 2016-09-04 | Discharge: 2016-09-04 | Disposition: A | Discharge status: Home / Self Care | Payer: Medicare Other | Source: Ambulatory Visit | Attending: Cardiovascular Disease | Admitting: Cardiovascular Disease

## 2016-09-06 ENCOUNTER — Encounter (HOSPITAL_COMMUNITY)
Admission: RE | Admit: 2016-09-06 | Discharge: 2016-09-06 | Disposition: A | Discharge status: Home / Self Care | Payer: Medicare Other | Source: Ambulatory Visit | Attending: Cardiovascular Disease | Admitting: Cardiovascular Disease

## 2016-09-07 ENCOUNTER — Telehealth: Payer: Self-pay | Admitting: Cardiovascular Disease

## 2016-09-07 NOTE — Telephone Encounter (Signed)
New Message    Arline Asp states VM is confidential. She needs medical conditions, Echo date and results.

## 2016-09-07 NOTE — Telephone Encounter (Signed)
Left Cindy a VM asking her to fax over the EF% request form- left her with both fax & phone number to medical records.

## 2016-09-08 ENCOUNTER — Encounter (HOSPITAL_COMMUNITY)
Admission: RE | Admit: 2016-09-08 | Discharge: 2016-09-08 | Disposition: A | Discharge status: Home / Self Care | Payer: Medicare Other | Source: Ambulatory Visit | Attending: Cardiovascular Disease | Admitting: Cardiovascular Disease

## 2016-09-11 ENCOUNTER — Encounter (HOSPITAL_COMMUNITY)
Admission: RE | Admit: 2016-09-11 | Discharge: 2016-09-11 | Disposition: A | Discharge status: Home / Self Care | Payer: Medicare Other | Source: Ambulatory Visit | Attending: Cardiovascular Disease | Admitting: Cardiovascular Disease

## 2016-09-13 ENCOUNTER — Encounter (HOSPITAL_COMMUNITY)
Admission: RE | Admit: 2016-09-13 | Discharge: 2016-09-13 | Disposition: A | Discharge status: Home / Self Care | Payer: Medicare Other | Source: Ambulatory Visit | Attending: Cardiovascular Disease | Admitting: Cardiovascular Disease

## 2016-09-15 ENCOUNTER — Encounter (HOSPITAL_COMMUNITY)
Admission: RE | Admit: 2016-09-15 | Discharge: 2016-09-15 | Disposition: A | Discharge status: Home / Self Care | Payer: Medicare Other | Source: Ambulatory Visit | Attending: Cardiovascular Disease | Admitting: Cardiovascular Disease

## 2016-09-18 ENCOUNTER — Encounter (HOSPITAL_COMMUNITY)
Admission: RE | Admit: 2016-09-18 | Discharge: 2016-09-18 | Disposition: A | Discharge status: Home / Self Care | Payer: Medicare Other | Source: Ambulatory Visit | Attending: Cardiovascular Disease | Admitting: Cardiovascular Disease

## 2016-09-20 ENCOUNTER — Encounter (HOSPITAL_COMMUNITY)
Admission: RE | Admit: 2016-09-20 | Discharge: 2016-09-20 | Disposition: A | Discharge status: Home / Self Care | Payer: Medicare Other | Source: Ambulatory Visit | Attending: Cardiovascular Disease | Admitting: Cardiovascular Disease

## 2016-09-22 ENCOUNTER — Encounter (HOSPITAL_COMMUNITY): Payer: Self-pay

## 2016-09-22 DIAGNOSIS — Z951 Presence of aortocoronary bypass graft: Secondary | ICD-10-CM | POA: Insufficient documentation

## 2016-09-25 ENCOUNTER — Encounter (HOSPITAL_COMMUNITY)
Admission: RE | Admit: 2016-09-25 | Discharge: 2016-09-25 | Disposition: A | Discharge status: Home / Self Care | Payer: Self-pay | Source: Ambulatory Visit | Attending: Cardiovascular Disease | Admitting: Cardiovascular Disease

## 2016-09-27 ENCOUNTER — Encounter (HOSPITAL_COMMUNITY)
Admission: RE | Admit: 2016-09-27 | Discharge: 2016-09-27 | Disposition: A | Discharge status: Home / Self Care | Payer: Self-pay | Source: Ambulatory Visit | Attending: Cardiovascular Disease | Admitting: Cardiovascular Disease

## 2016-09-29 ENCOUNTER — Encounter (HOSPITAL_COMMUNITY): Payer: Self-pay

## 2016-10-02 ENCOUNTER — Encounter (HOSPITAL_COMMUNITY): Payer: Self-pay

## 2016-10-04 ENCOUNTER — Encounter (HOSPITAL_COMMUNITY)
Admission: RE | Admit: 2016-10-04 | Discharge: 2016-10-04 | Disposition: A | Discharge status: Home / Self Care | Payer: Self-pay | Source: Ambulatory Visit | Attending: Cardiovascular Disease | Admitting: Cardiovascular Disease

## 2016-10-06 ENCOUNTER — Encounter (HOSPITAL_COMMUNITY)
Admission: RE | Admit: 2016-10-06 | Discharge: 2016-10-06 | Disposition: A | Discharge status: Home / Self Care | Payer: Self-pay | Source: Ambulatory Visit | Attending: Cardiovascular Disease | Admitting: Cardiovascular Disease

## 2016-10-09 ENCOUNTER — Encounter (HOSPITAL_COMMUNITY)
Admission: RE | Admit: 2016-10-09 | Discharge: 2016-10-09 | Disposition: A | Discharge status: Home / Self Care | Payer: Self-pay | Source: Ambulatory Visit | Attending: Cardiovascular Disease | Admitting: Cardiovascular Disease

## 2016-10-11 ENCOUNTER — Encounter (HOSPITAL_COMMUNITY)
Admission: RE | Admit: 2016-10-11 | Discharge: 2016-10-11 | Disposition: A | Discharge status: Home / Self Care | Payer: Self-pay | Source: Ambulatory Visit | Attending: Cardiovascular Disease | Admitting: Cardiovascular Disease

## 2016-10-13 ENCOUNTER — Encounter (HOSPITAL_COMMUNITY)
Admission: RE | Admit: 2016-10-13 | Discharge: 2016-10-13 | Disposition: A | Discharge status: Home / Self Care | Payer: Self-pay | Source: Ambulatory Visit | Attending: Cardiovascular Disease | Admitting: Cardiovascular Disease

## 2016-10-16 ENCOUNTER — Encounter (HOSPITAL_COMMUNITY)
Admission: RE | Admit: 2016-10-16 | Discharge: 2016-10-16 | Disposition: A | Discharge status: Home / Self Care | Payer: Self-pay | Source: Ambulatory Visit | Attending: Cardiovascular Disease | Admitting: Cardiovascular Disease

## 2016-10-18 ENCOUNTER — Encounter (HOSPITAL_COMMUNITY)
Admission: RE | Admit: 2016-10-18 | Discharge: 2016-10-18 | Disposition: A | Discharge status: Home / Self Care | Payer: Self-pay | Source: Ambulatory Visit | Attending: Cardiovascular Disease | Admitting: Cardiovascular Disease

## 2016-10-20 ENCOUNTER — Encounter (HOSPITAL_COMMUNITY)
Admission: RE | Admit: 2016-10-20 | Discharge: 2016-10-20 | Disposition: A | Discharge status: Home / Self Care | Payer: Self-pay | Source: Ambulatory Visit | Attending: Cardiovascular Disease | Admitting: Cardiovascular Disease

## 2016-10-23 ENCOUNTER — Encounter (HOSPITAL_COMMUNITY)
Admission: RE | Admit: 2016-10-23 | Discharge: 2016-10-23 | Disposition: A | Discharge status: Home / Self Care | Payer: Self-pay | Source: Ambulatory Visit | Attending: Cardiovascular Disease | Admitting: Cardiovascular Disease

## 2016-10-23 DIAGNOSIS — Z951 Presence of aortocoronary bypass graft: Secondary | ICD-10-CM | POA: Insufficient documentation

## 2016-10-25 ENCOUNTER — Encounter (HOSPITAL_COMMUNITY): Admission: RE | Admit: 2016-10-25 | Payer: Self-pay | Source: Ambulatory Visit

## 2016-10-25 ENCOUNTER — Encounter (HOSPITAL_COMMUNITY): Payer: Self-pay

## 2016-10-27 ENCOUNTER — Encounter (HOSPITAL_COMMUNITY)
Admission: RE | Admit: 2016-10-27 | Discharge: 2016-10-27 | Disposition: A | Discharge status: Home / Self Care | Payer: Self-pay | Source: Ambulatory Visit | Attending: Cardiovascular Disease | Admitting: Cardiovascular Disease

## 2016-10-27 ENCOUNTER — Encounter (HOSPITAL_COMMUNITY): Payer: Self-pay

## 2016-10-30 ENCOUNTER — Encounter (HOSPITAL_COMMUNITY): Payer: Self-pay

## 2016-10-30 ENCOUNTER — Encounter (HOSPITAL_COMMUNITY)
Admission: RE | Admit: 2016-10-30 | Discharge: 2016-10-30 | Disposition: A | Discharge status: Home / Self Care | Payer: Self-pay | Source: Ambulatory Visit | Attending: Cardiovascular Disease | Admitting: Cardiovascular Disease

## 2016-11-01 ENCOUNTER — Encounter (HOSPITAL_COMMUNITY)
Admission: RE | Admit: 2016-11-01 | Discharge: 2016-11-01 | Disposition: A | Discharge status: Home / Self Care | Payer: Self-pay | Source: Ambulatory Visit | Attending: Cardiovascular Disease | Admitting: Cardiovascular Disease

## 2016-11-01 ENCOUNTER — Encounter (HOSPITAL_COMMUNITY): Payer: Self-pay

## 2016-11-03 ENCOUNTER — Encounter (HOSPITAL_COMMUNITY): Payer: Self-pay

## 2016-11-03 ENCOUNTER — Encounter (HOSPITAL_COMMUNITY)
Admission: RE | Admit: 2016-11-03 | Discharge: 2016-11-03 | Disposition: A | Discharge status: Home / Self Care | Payer: Self-pay | Source: Ambulatory Visit | Attending: Cardiovascular Disease | Admitting: Cardiovascular Disease

## 2016-11-06 ENCOUNTER — Encounter (HOSPITAL_COMMUNITY)
Admission: RE | Admit: 2016-11-06 | Discharge: 2016-11-06 | Disposition: A | Discharge status: Home / Self Care | Payer: Self-pay | Source: Ambulatory Visit | Attending: Cardiovascular Disease | Admitting: Cardiovascular Disease

## 2016-11-06 ENCOUNTER — Encounter (HOSPITAL_COMMUNITY): Payer: Self-pay

## 2016-11-08 ENCOUNTER — Encounter (HOSPITAL_COMMUNITY)
Admission: RE | Admit: 2016-11-08 | Discharge: 2016-11-08 | Disposition: A | Discharge status: Home / Self Care | Payer: Self-pay | Source: Ambulatory Visit | Attending: Cardiovascular Disease | Admitting: Cardiovascular Disease

## 2016-11-08 ENCOUNTER — Encounter (HOSPITAL_COMMUNITY): Payer: Self-pay

## 2016-11-10 ENCOUNTER — Encounter (HOSPITAL_COMMUNITY): Payer: Self-pay

## 2016-11-10 ENCOUNTER — Encounter (HOSPITAL_COMMUNITY)
Admission: RE | Admit: 2016-11-10 | Discharge: 2016-11-10 | Disposition: A | Discharge status: Home / Self Care | Payer: Self-pay | Source: Ambulatory Visit | Attending: Cardiovascular Disease | Admitting: Cardiovascular Disease

## 2016-11-13 ENCOUNTER — Encounter (HOSPITAL_COMMUNITY): Payer: Self-pay

## 2016-11-13 ENCOUNTER — Encounter (HOSPITAL_COMMUNITY)
Admission: RE | Admit: 2016-11-13 | Discharge: 2016-11-13 | Disposition: A | Discharge status: Home / Self Care | Payer: Self-pay | Source: Ambulatory Visit | Attending: Cardiovascular Disease | Admitting: Cardiovascular Disease

## 2016-11-15 ENCOUNTER — Encounter (HOSPITAL_COMMUNITY): Payer: Self-pay

## 2016-11-15 ENCOUNTER — Encounter (HOSPITAL_COMMUNITY)
Admission: RE | Admit: 2016-11-15 | Discharge: 2016-11-15 | Disposition: A | Discharge status: Home / Self Care | Payer: Self-pay | Source: Ambulatory Visit | Attending: Cardiovascular Disease | Admitting: Cardiovascular Disease

## 2016-11-17 ENCOUNTER — Encounter (HOSPITAL_COMMUNITY): Payer: Self-pay

## 2016-11-17 ENCOUNTER — Encounter (HOSPITAL_COMMUNITY)
Admission: RE | Admit: 2016-11-17 | Discharge: 2016-11-17 | Disposition: A | Discharge status: Home / Self Care | Payer: Self-pay | Source: Ambulatory Visit | Attending: Cardiovascular Disease | Admitting: Cardiovascular Disease

## 2016-11-20 ENCOUNTER — Encounter (HOSPITAL_COMMUNITY): Payer: Self-pay

## 2016-11-22 ENCOUNTER — Encounter (HOSPITAL_COMMUNITY)
Admission: RE | Admit: 2016-11-22 | Discharge: 2016-11-22 | Disposition: A | Discharge status: Home / Self Care | Payer: Self-pay | Source: Ambulatory Visit | Attending: Cardiovascular Disease | Admitting: Cardiovascular Disease

## 2016-11-22 ENCOUNTER — Encounter (HOSPITAL_COMMUNITY): Payer: Self-pay

## 2016-11-22 DIAGNOSIS — Z951 Presence of aortocoronary bypass graft: Secondary | ICD-10-CM | POA: Insufficient documentation

## 2016-11-24 ENCOUNTER — Encounter (HOSPITAL_COMMUNITY): Payer: Self-pay

## 2016-11-24 ENCOUNTER — Encounter (HOSPITAL_COMMUNITY)
Admission: RE | Admit: 2016-11-24 | Discharge: 2016-11-24 | Disposition: A | Discharge status: Home / Self Care | Payer: Self-pay | Source: Ambulatory Visit | Attending: Cardiovascular Disease | Admitting: Cardiovascular Disease

## 2016-11-27 ENCOUNTER — Encounter (HOSPITAL_COMMUNITY)
Admission: RE | Admit: 2016-11-27 | Discharge: 2016-11-27 | Disposition: A | Discharge status: Home / Self Care | Payer: Self-pay | Source: Ambulatory Visit | Attending: Cardiovascular Disease | Admitting: Cardiovascular Disease

## 2016-11-27 ENCOUNTER — Encounter (HOSPITAL_COMMUNITY): Payer: Self-pay

## 2016-11-29 ENCOUNTER — Encounter (HOSPITAL_COMMUNITY): Payer: Self-pay

## 2016-11-29 ENCOUNTER — Encounter (HOSPITAL_COMMUNITY)
Admission: RE | Admit: 2016-11-29 | Discharge: 2016-11-29 | Disposition: A | Discharge status: Home / Self Care | Payer: Self-pay | Source: Ambulatory Visit | Attending: Cardiovascular Disease | Admitting: Cardiovascular Disease

## 2016-11-30 ENCOUNTER — Other Ambulatory Visit: Payer: Self-pay | Admitting: Physician Assistant

## 2016-11-30 NOTE — Telephone Encounter (Signed)
REFILL 

## 2016-12-01 ENCOUNTER — Encounter (HOSPITAL_COMMUNITY): Payer: Self-pay

## 2016-12-01 ENCOUNTER — Encounter (HOSPITAL_COMMUNITY)
Admission: RE | Admit: 2016-12-01 | Discharge: 2016-12-01 | Disposition: A | Discharge status: Home / Self Care | Payer: Self-pay | Source: Ambulatory Visit | Attending: Cardiovascular Disease | Admitting: Cardiovascular Disease

## 2016-12-04 ENCOUNTER — Encounter (HOSPITAL_COMMUNITY)
Admission: RE | Admit: 2016-12-04 | Discharge: 2016-12-04 | Disposition: A | Discharge status: Home / Self Care | Payer: Self-pay | Source: Ambulatory Visit | Attending: Cardiovascular Disease | Admitting: Cardiovascular Disease

## 2016-12-04 ENCOUNTER — Encounter (HOSPITAL_COMMUNITY): Payer: Self-pay

## 2016-12-06 ENCOUNTER — Encounter (HOSPITAL_COMMUNITY)
Admission: RE | Admit: 2016-12-06 | Discharge: 2016-12-06 | Disposition: A | Discharge status: Home / Self Care | Payer: Self-pay | Source: Ambulatory Visit | Attending: Cardiovascular Disease | Admitting: Cardiovascular Disease

## 2016-12-06 ENCOUNTER — Encounter (HOSPITAL_COMMUNITY): Payer: Self-pay

## 2016-12-08 ENCOUNTER — Encounter (HOSPITAL_COMMUNITY)
Admission: RE | Admit: 2016-12-08 | Discharge: 2016-12-08 | Disposition: A | Discharge status: Home / Self Care | Payer: Self-pay | Source: Ambulatory Visit | Attending: Cardiovascular Disease | Admitting: Cardiovascular Disease

## 2016-12-08 ENCOUNTER — Encounter (HOSPITAL_COMMUNITY): Payer: Self-pay

## 2016-12-11 ENCOUNTER — Encounter (HOSPITAL_COMMUNITY): Payer: Self-pay

## 2016-12-11 ENCOUNTER — Encounter (HOSPITAL_COMMUNITY)
Admission: RE | Admit: 2016-12-11 | Discharge: 2016-12-11 | Disposition: A | Discharge status: Home / Self Care | Payer: Self-pay | Source: Ambulatory Visit | Attending: Cardiovascular Disease | Admitting: Cardiovascular Disease

## 2016-12-13 ENCOUNTER — Encounter (HOSPITAL_COMMUNITY)
Admission: RE | Admit: 2016-12-13 | Discharge: 2016-12-13 | Disposition: A | Discharge status: Home / Self Care | Payer: Self-pay | Source: Ambulatory Visit | Attending: Cardiovascular Disease | Admitting: Cardiovascular Disease

## 2016-12-13 ENCOUNTER — Encounter (HOSPITAL_COMMUNITY): Payer: Self-pay

## 2016-12-15 ENCOUNTER — Encounter (HOSPITAL_COMMUNITY)
Admission: RE | Admit: 2016-12-15 | Discharge: 2016-12-15 | Disposition: A | Discharge status: Home / Self Care | Payer: Self-pay | Source: Ambulatory Visit | Attending: Cardiovascular Disease | Admitting: Cardiovascular Disease

## 2016-12-15 ENCOUNTER — Encounter (HOSPITAL_COMMUNITY): Payer: Self-pay

## 2016-12-20 ENCOUNTER — Encounter (HOSPITAL_COMMUNITY): Payer: Self-pay

## 2016-12-22 ENCOUNTER — Encounter (HOSPITAL_COMMUNITY): Payer: Self-pay

## 2016-12-22 ENCOUNTER — Encounter (HOSPITAL_COMMUNITY)
Admission: RE | Admit: 2016-12-22 | Discharge: 2016-12-22 | Disposition: A | Discharge status: Home / Self Care | Payer: Self-pay | Source: Ambulatory Visit | Attending: Cardiovascular Disease | Admitting: Cardiovascular Disease

## 2016-12-22 DIAGNOSIS — Z951 Presence of aortocoronary bypass graft: Secondary | ICD-10-CM | POA: Insufficient documentation

## 2016-12-25 ENCOUNTER — Encounter (HOSPITAL_COMMUNITY): Payer: Self-pay

## 2016-12-25 ENCOUNTER — Encounter (HOSPITAL_COMMUNITY)
Admission: RE | Admit: 2016-12-25 | Discharge: 2016-12-25 | Disposition: A | Discharge status: Home / Self Care | Payer: Self-pay | Source: Ambulatory Visit | Attending: Cardiovascular Disease | Admitting: Cardiovascular Disease

## 2016-12-27 ENCOUNTER — Encounter (HOSPITAL_COMMUNITY): Payer: Self-pay

## 2016-12-27 ENCOUNTER — Encounter (HOSPITAL_COMMUNITY)
Admission: RE | Admit: 2016-12-27 | Discharge: 2016-12-27 | Disposition: A | Discharge status: Home / Self Care | Payer: Self-pay | Source: Ambulatory Visit | Attending: Cardiovascular Disease | Admitting: Cardiovascular Disease

## 2016-12-29 ENCOUNTER — Encounter (HOSPITAL_COMMUNITY)
Admission: RE | Admit: 2016-12-29 | Discharge: 2016-12-29 | Disposition: A | Discharge status: Home / Self Care | Payer: Self-pay | Source: Ambulatory Visit | Attending: Cardiovascular Disease | Admitting: Cardiovascular Disease

## 2016-12-29 ENCOUNTER — Encounter (HOSPITAL_COMMUNITY): Payer: Self-pay

## 2017-01-01 ENCOUNTER — Encounter (HOSPITAL_COMMUNITY): Payer: Self-pay

## 2017-01-01 ENCOUNTER — Encounter (HOSPITAL_COMMUNITY)
Admission: RE | Admit: 2017-01-01 | Discharge: 2017-01-01 | Disposition: A | Discharge status: Home / Self Care | Payer: Self-pay | Source: Ambulatory Visit | Attending: Cardiovascular Disease | Admitting: Cardiovascular Disease

## 2017-01-03 ENCOUNTER — Encounter (HOSPITAL_COMMUNITY): Payer: Self-pay

## 2017-01-03 ENCOUNTER — Encounter (HOSPITAL_COMMUNITY)
Admission: RE | Admit: 2017-01-03 | Discharge: 2017-01-03 | Disposition: A | Discharge status: Home / Self Care | Payer: Self-pay | Source: Ambulatory Visit | Attending: Cardiovascular Disease | Admitting: Cardiovascular Disease

## 2017-01-05 ENCOUNTER — Encounter (HOSPITAL_COMMUNITY)
Admission: RE | Admit: 2017-01-05 | Discharge: 2017-01-05 | Disposition: A | Discharge status: Home / Self Care | Payer: Self-pay | Source: Ambulatory Visit | Attending: Cardiovascular Disease | Admitting: Cardiovascular Disease

## 2017-01-05 ENCOUNTER — Encounter (HOSPITAL_COMMUNITY): Payer: Self-pay

## 2017-01-08 ENCOUNTER — Encounter (HOSPITAL_COMMUNITY): Payer: Self-pay

## 2017-01-08 ENCOUNTER — Encounter (HOSPITAL_COMMUNITY)
Admission: RE | Admit: 2017-01-08 | Discharge: 2017-01-08 | Disposition: A | Discharge status: Home / Self Care | Payer: Self-pay | Source: Ambulatory Visit | Attending: Cardiovascular Disease | Admitting: Cardiovascular Disease

## 2017-01-10 ENCOUNTER — Encounter (HOSPITAL_COMMUNITY)
Admission: RE | Admit: 2017-01-10 | Discharge: 2017-01-10 | Disposition: A | Discharge status: Home / Self Care | Payer: Self-pay | Source: Ambulatory Visit | Attending: Cardiovascular Disease | Admitting: Cardiovascular Disease

## 2017-01-10 ENCOUNTER — Encounter (HOSPITAL_COMMUNITY): Payer: Self-pay

## 2017-01-10 NOTE — Progress Notes (Signed)
Pt c/o dizziness post exercise at cardiac rehab.  Pt immediately laid down on chairs with feet elevated.    BP: 82/40.  HR_65 sinus rhythm.  Pt given gatorade, water and banana. Pt reports he experienced generalized joint aches last night after working outdoors for several hours in 90 degree temperatures yesterday.  Recheck BP:  102/60.  Pt sat up with minimal dizziness.  orthostat BP obtained.  Lying 119/78, HR65.  Sitting:  94/58, HR- 66 standing:  103/70, HR- 73.    Pt asymptomatic. Pt walked around track.  BP:  112/72, HR-69.  Pt stated relief of symptoms.  Left department in stable condition.

## 2017-01-12 ENCOUNTER — Telehealth (HOSPITAL_COMMUNITY): Payer: Self-pay | Admitting: Cardiac Rehabilitation

## 2017-01-12 ENCOUNTER — Encounter (HOSPITAL_COMMUNITY): Payer: Self-pay

## 2017-01-12 NOTE — Telephone Encounter (Signed)
-----   Message from Chilton Si, MD sent at 01/10/2017  5:28 PM EDT ----- Thank you very much for letting me know what happened.  Please let me know if this is an ongoing problem and we will stop metoprolol.  Tiffany C. Duke Salvia, MD, [facility]  ----- Message ----- From: Robyne Peers, RN Sent: 01/10/2017   3:48 PM To: Chilton Si, MD  Dear Dr. Duke Salvia,  Pt had symptomatic hypotension  at cardiac rehab this morning.  Probable dehydration.  Pt felt dizzy and presyncopal.  Pt given gatorade, water and banana with relief of symptoms.  See attached progress note for VS.    Thank you, Deveron Furlong, RN, BSN Cardiac Pulmonary Rehab

## 2017-01-15 ENCOUNTER — Encounter (HOSPITAL_COMMUNITY): Payer: Self-pay

## 2017-01-17 ENCOUNTER — Encounter (HOSPITAL_COMMUNITY): Payer: Self-pay

## 2017-01-19 ENCOUNTER — Encounter (HOSPITAL_COMMUNITY): Payer: Self-pay

## 2017-01-22 ENCOUNTER — Encounter (HOSPITAL_COMMUNITY): Payer: Self-pay

## 2017-01-22 ENCOUNTER — Encounter (HOSPITAL_COMMUNITY)
Admission: RE | Admit: 2017-01-22 | Discharge: 2017-01-22 | Disposition: A | Discharge status: Home / Self Care | Payer: Self-pay | Source: Ambulatory Visit | Attending: Cardiovascular Disease | Admitting: Cardiovascular Disease

## 2017-01-22 DIAGNOSIS — Z951 Presence of aortocoronary bypass graft: Secondary | ICD-10-CM | POA: Insufficient documentation

## 2017-01-26 ENCOUNTER — Encounter (HOSPITAL_COMMUNITY)
Admission: RE | Admit: 2017-01-26 | Discharge: 2017-01-26 | Disposition: A | Discharge status: Home / Self Care | Payer: Self-pay | Source: Ambulatory Visit | Attending: Cardiovascular Disease | Admitting: Cardiovascular Disease

## 2017-01-26 ENCOUNTER — Encounter (HOSPITAL_COMMUNITY): Payer: Self-pay

## 2017-01-29 ENCOUNTER — Encounter (HOSPITAL_COMMUNITY): Payer: Self-pay

## 2017-01-29 ENCOUNTER — Encounter (HOSPITAL_COMMUNITY)
Admission: RE | Admit: 2017-01-29 | Discharge: 2017-01-29 | Disposition: A | Discharge status: Home / Self Care | Payer: Self-pay | Source: Ambulatory Visit | Attending: Cardiovascular Disease | Admitting: Cardiovascular Disease

## 2017-01-31 ENCOUNTER — Encounter (HOSPITAL_COMMUNITY): Payer: Self-pay

## 2017-01-31 ENCOUNTER — Encounter (HOSPITAL_COMMUNITY)
Admission: RE | Admit: 2017-01-31 | Discharge: 2017-01-31 | Disposition: A | Discharge status: Home / Self Care | Payer: Self-pay | Source: Ambulatory Visit | Attending: Cardiovascular Disease | Admitting: Cardiovascular Disease

## 2017-02-02 ENCOUNTER — Encounter (HOSPITAL_COMMUNITY): Payer: Self-pay

## 2017-02-05 ENCOUNTER — Encounter (HOSPITAL_COMMUNITY): Payer: Self-pay

## 2017-02-05 ENCOUNTER — Encounter (HOSPITAL_COMMUNITY)
Admission: RE | Admit: 2017-02-05 | Discharge: 2017-02-05 | Disposition: A | Discharge status: Home / Self Care | Payer: Self-pay | Source: Ambulatory Visit | Attending: Cardiovascular Disease | Admitting: Cardiovascular Disease

## 2017-02-07 ENCOUNTER — Encounter (HOSPITAL_COMMUNITY): Payer: Self-pay

## 2017-02-07 ENCOUNTER — Encounter (HOSPITAL_COMMUNITY)
Admission: RE | Admit: 2017-02-07 | Discharge: 2017-02-07 | Disposition: A | Discharge status: Home / Self Care | Payer: Self-pay | Source: Ambulatory Visit | Attending: Cardiovascular Disease | Admitting: Cardiovascular Disease

## 2017-02-09 ENCOUNTER — Encounter (HOSPITAL_COMMUNITY)
Admission: RE | Admit: 2017-02-09 | Discharge: 2017-02-09 | Disposition: A | Discharge status: Home / Self Care | Payer: Self-pay | Source: Ambulatory Visit | Attending: Cardiovascular Disease | Admitting: Cardiovascular Disease

## 2017-02-09 ENCOUNTER — Encounter (HOSPITAL_COMMUNITY): Payer: Self-pay

## 2017-02-12 ENCOUNTER — Encounter (HOSPITAL_COMMUNITY): Payer: Self-pay

## 2017-02-12 ENCOUNTER — Encounter (HOSPITAL_COMMUNITY)
Admission: RE | Admit: 2017-02-12 | Discharge: 2017-02-12 | Disposition: A | Discharge status: Home / Self Care | Payer: Self-pay | Source: Ambulatory Visit | Attending: Cardiovascular Disease | Admitting: Cardiovascular Disease

## 2017-02-14 ENCOUNTER — Encounter (HOSPITAL_COMMUNITY): Payer: Self-pay

## 2017-02-16 ENCOUNTER — Encounter (HOSPITAL_COMMUNITY): Payer: Self-pay

## 2017-02-16 ENCOUNTER — Encounter (HOSPITAL_COMMUNITY)
Admission: RE | Admit: 2017-02-16 | Discharge: 2017-02-16 | Disposition: A | Discharge status: Home / Self Care | Payer: Self-pay | Source: Ambulatory Visit | Attending: Cardiovascular Disease | Admitting: Cardiovascular Disease

## 2017-02-19 ENCOUNTER — Encounter (HOSPITAL_COMMUNITY)
Admission: RE | Admit: 2017-02-19 | Discharge: 2017-02-19 | Disposition: A | Discharge status: Home / Self Care | Payer: Self-pay | Source: Ambulatory Visit | Attending: Cardiovascular Disease | Admitting: Cardiovascular Disease

## 2017-02-19 ENCOUNTER — Encounter (HOSPITAL_COMMUNITY): Payer: Self-pay

## 2017-02-21 ENCOUNTER — Encounter (HOSPITAL_COMMUNITY)
Admission: RE | Admit: 2017-02-21 | Discharge: 2017-02-21 | Disposition: A | Discharge status: Home / Self Care | Payer: Self-pay | Source: Ambulatory Visit | Attending: Cardiovascular Disease | Admitting: Cardiovascular Disease

## 2017-02-21 ENCOUNTER — Encounter (HOSPITAL_COMMUNITY): Payer: Self-pay

## 2017-02-21 DIAGNOSIS — Z951 Presence of aortocoronary bypass graft: Secondary | ICD-10-CM | POA: Insufficient documentation

## 2017-02-23 ENCOUNTER — Encounter (HOSPITAL_COMMUNITY): Payer: Self-pay

## 2017-02-23 ENCOUNTER — Encounter (HOSPITAL_COMMUNITY)
Admission: RE | Admit: 2017-02-23 | Discharge: 2017-02-23 | Disposition: A | Discharge status: Home / Self Care | Payer: Self-pay | Source: Ambulatory Visit | Attending: Cardiovascular Disease | Admitting: Cardiovascular Disease

## 2017-02-26 ENCOUNTER — Encounter (HOSPITAL_COMMUNITY): Payer: Self-pay

## 2017-02-28 ENCOUNTER — Encounter (HOSPITAL_COMMUNITY): Payer: Self-pay

## 2017-02-28 ENCOUNTER — Encounter (HOSPITAL_COMMUNITY)
Admission: RE | Admit: 2017-02-28 | Discharge: 2017-02-28 | Disposition: A | Discharge status: Home / Self Care | Payer: Self-pay | Source: Ambulatory Visit | Attending: Cardiovascular Disease | Admitting: Cardiovascular Disease

## 2017-03-02 ENCOUNTER — Encounter (HOSPITAL_COMMUNITY): Payer: Self-pay

## 2017-03-02 ENCOUNTER — Encounter (HOSPITAL_COMMUNITY)
Admission: RE | Admit: 2017-03-02 | Discharge: 2017-03-02 | Disposition: A | Discharge status: Home / Self Care | Payer: Self-pay | Source: Ambulatory Visit | Attending: Cardiovascular Disease | Admitting: Cardiovascular Disease

## 2017-03-03 IMAGING — CR DG CHEST 2V
2 series · 2 of 2 positions shown · non-contrast
Comparison: September 19, 2010

CLINICAL DATA: Four day history of dizziness and chest pain

EXAM:
CHEST  2 VIEW

[w chest pa]
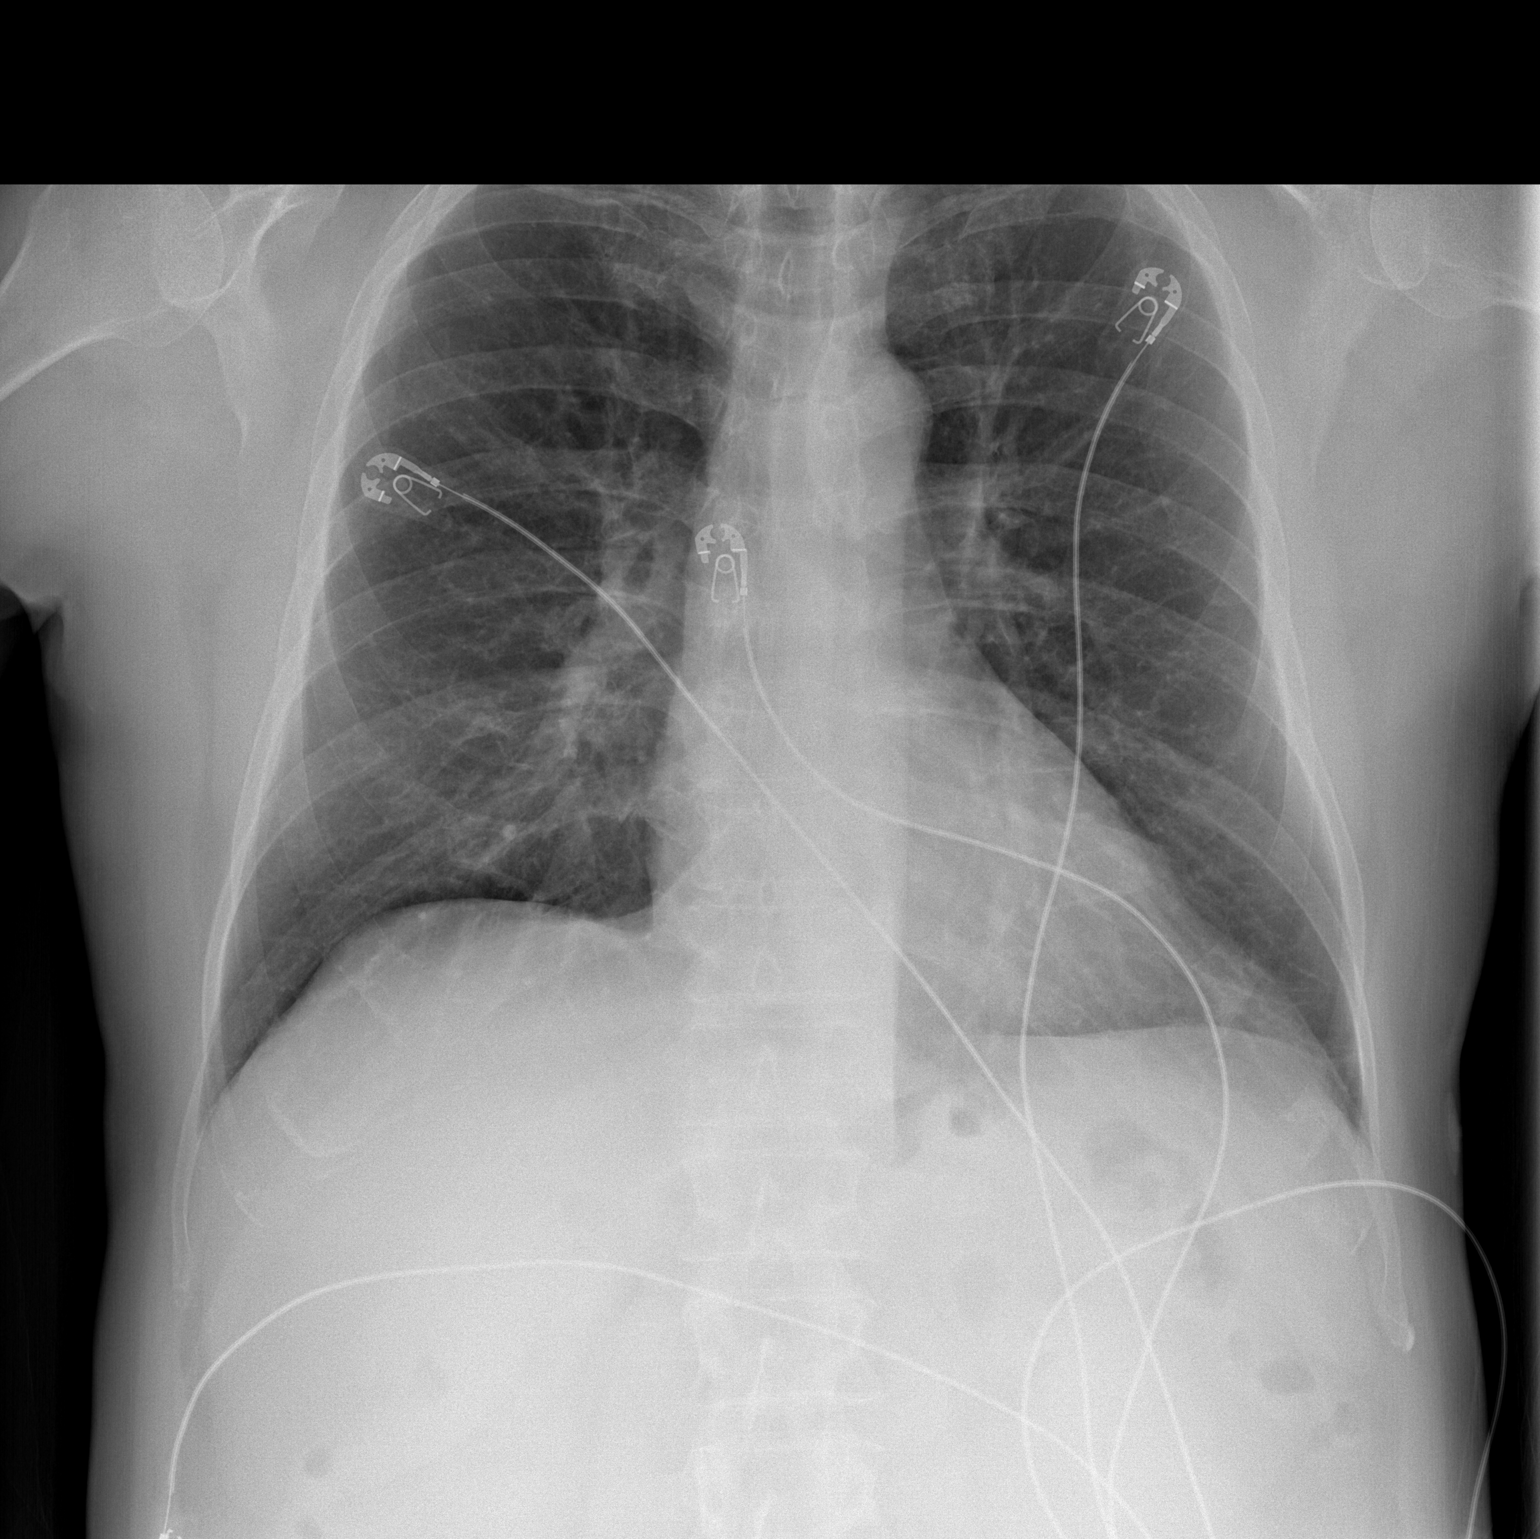

[w chest lat]
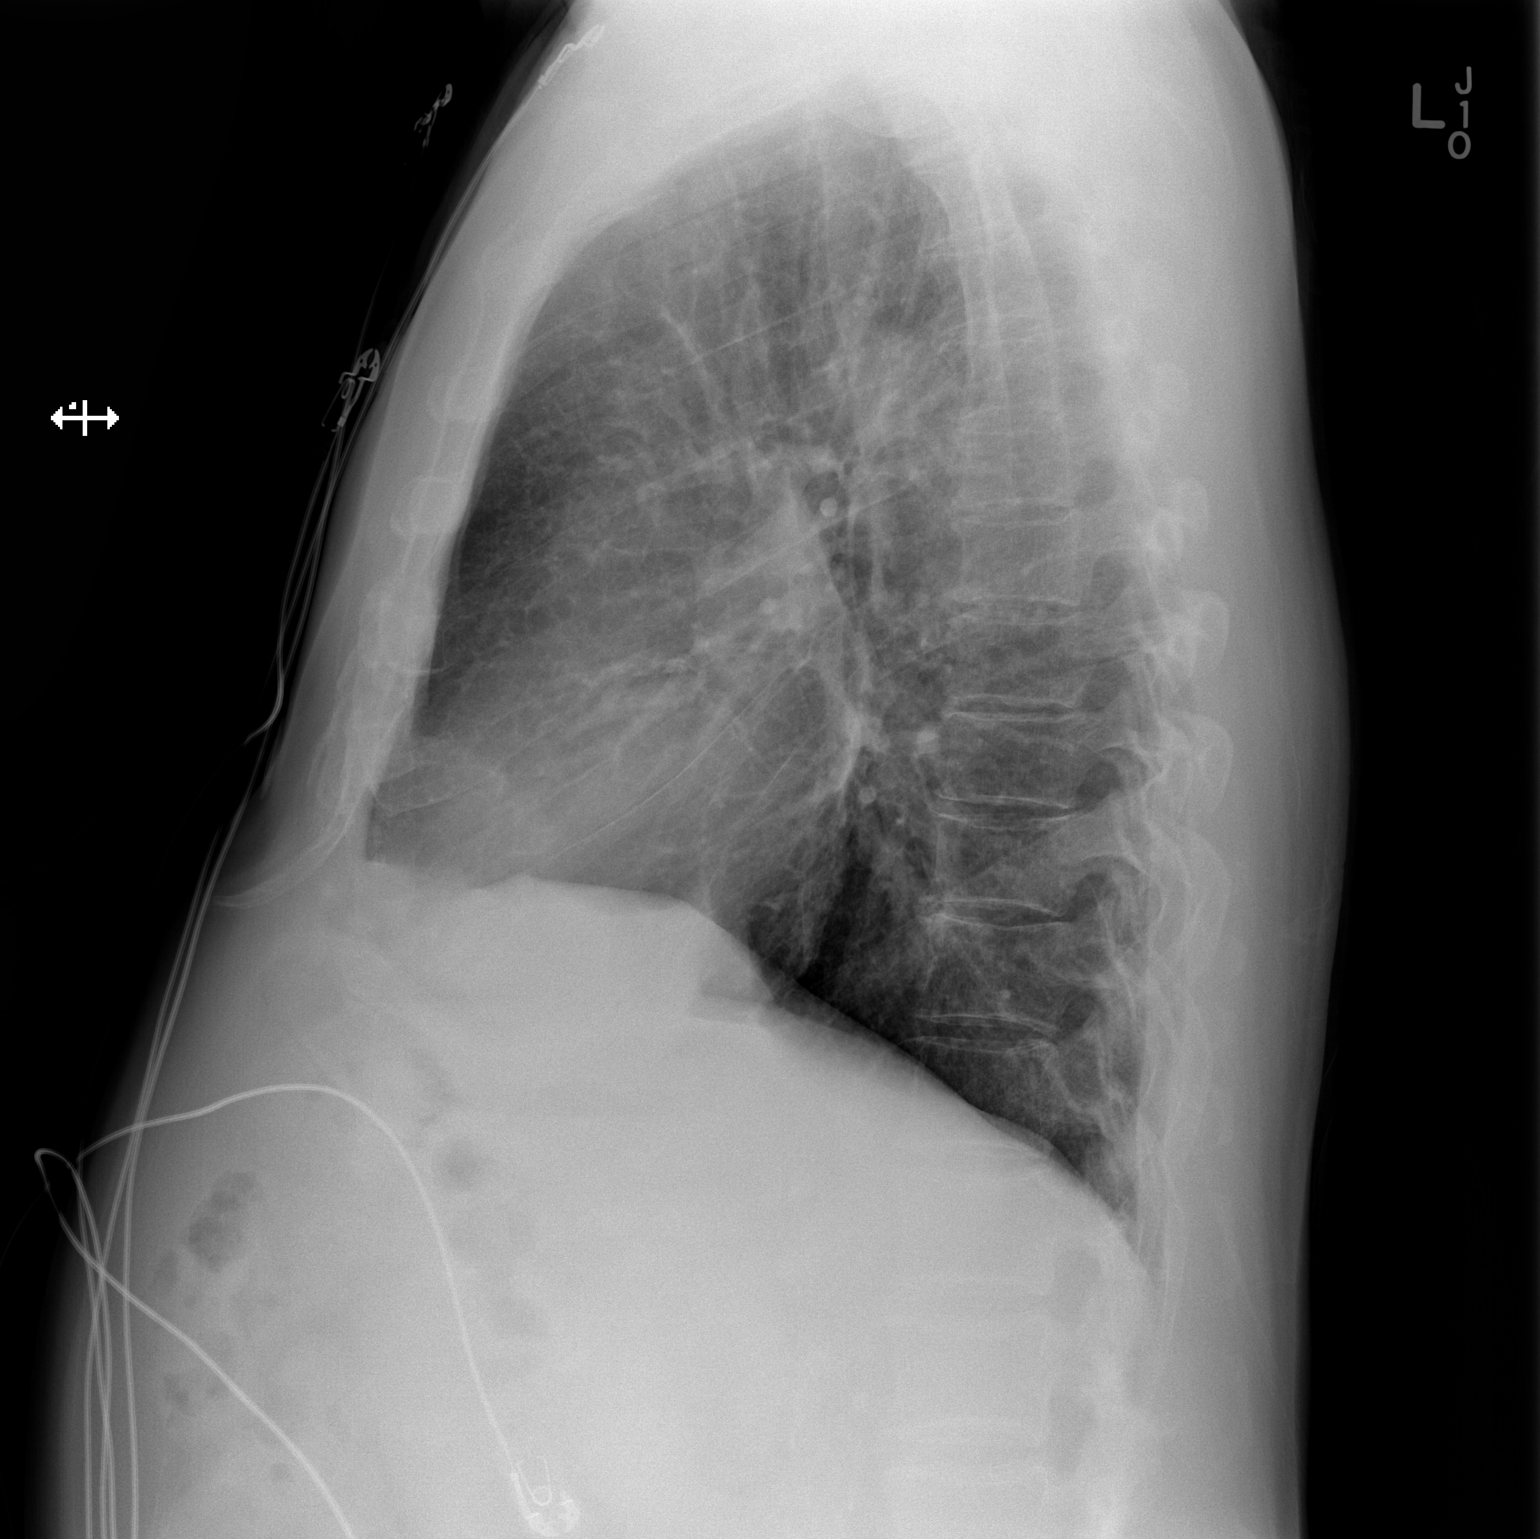

[2 of 2 positions shown; findings below may reference images not displayed]

FINDINGS: There is minimal scarring left base. There is no edema or
consolidation. Heart size and pulmonary vascularity are normal. No
adenopathy. No pneumothorax. No bone lesions.
IMPRESSION: Minimal scarring left base.  No edema or consolidation.

## 2017-03-05 ENCOUNTER — Encounter (HOSPITAL_COMMUNITY): Payer: Self-pay

## 2017-03-07 ENCOUNTER — Encounter (HOSPITAL_COMMUNITY): Payer: Self-pay

## 2017-03-09 ENCOUNTER — Encounter (HOSPITAL_COMMUNITY): Payer: Self-pay

## 2017-03-12 ENCOUNTER — Encounter (HOSPITAL_COMMUNITY)
Admission: RE | Admit: 2017-03-12 | Discharge: 2017-03-12 | Disposition: A | Discharge status: Home / Self Care | Payer: Self-pay | Source: Ambulatory Visit | Attending: Cardiovascular Disease | Admitting: Cardiovascular Disease

## 2017-03-12 ENCOUNTER — Encounter (HOSPITAL_COMMUNITY): Payer: Self-pay

## 2017-03-14 ENCOUNTER — Encounter (HOSPITAL_COMMUNITY): Payer: Self-pay

## 2017-03-14 ENCOUNTER — Encounter (HOSPITAL_COMMUNITY)
Admission: RE | Admit: 2017-03-14 | Discharge: 2017-03-14 | Disposition: A | Discharge status: Home / Self Care | Payer: Self-pay | Source: Ambulatory Visit | Attending: Cardiovascular Disease | Admitting: Cardiovascular Disease

## 2017-03-16 ENCOUNTER — Encounter (HOSPITAL_COMMUNITY): Payer: Self-pay

## 2017-03-16 ENCOUNTER — Encounter (HOSPITAL_COMMUNITY)
Admission: RE | Admit: 2017-03-16 | Discharge: 2017-03-16 | Disposition: A | Discharge status: Home / Self Care | Payer: Self-pay | Source: Ambulatory Visit | Attending: Cardiovascular Disease | Admitting: Cardiovascular Disease

## 2017-03-19 ENCOUNTER — Encounter (HOSPITAL_COMMUNITY): Payer: Self-pay

## 2017-03-19 ENCOUNTER — Encounter (HOSPITAL_COMMUNITY)
Admission: RE | Admit: 2017-03-19 | Discharge: 2017-03-19 | Disposition: A | Discharge status: Home / Self Care | Payer: Self-pay | Source: Ambulatory Visit | Attending: Cardiovascular Disease | Admitting: Cardiovascular Disease

## 2017-03-21 ENCOUNTER — Encounter (HOSPITAL_COMMUNITY): Payer: Self-pay

## 2017-03-21 ENCOUNTER — Encounter (HOSPITAL_COMMUNITY)
Admission: RE | Admit: 2017-03-21 | Discharge: 2017-03-21 | Disposition: A | Discharge status: Home / Self Care | Payer: Self-pay | Source: Ambulatory Visit | Attending: Cardiovascular Disease | Admitting: Cardiovascular Disease

## 2017-03-23 ENCOUNTER — Encounter (HOSPITAL_COMMUNITY)
Admission: RE | Admit: 2017-03-23 | Discharge: 2017-03-23 | Disposition: A | Discharge status: Home / Self Care | Payer: Self-pay | Source: Ambulatory Visit | Attending: Cardiovascular Disease | Admitting: Cardiovascular Disease

## 2017-03-23 ENCOUNTER — Encounter (HOSPITAL_COMMUNITY): Payer: Self-pay

## 2017-03-26 IMAGING — CR DG CHEST 2V
2 series · 2 of 2 positions shown · non-contrast
Comparison: 10/14/2014

CLINICAL DATA: CABG 3 weeks ago.

EXAM:
CHEST  2 VIEW

[w chest pa]
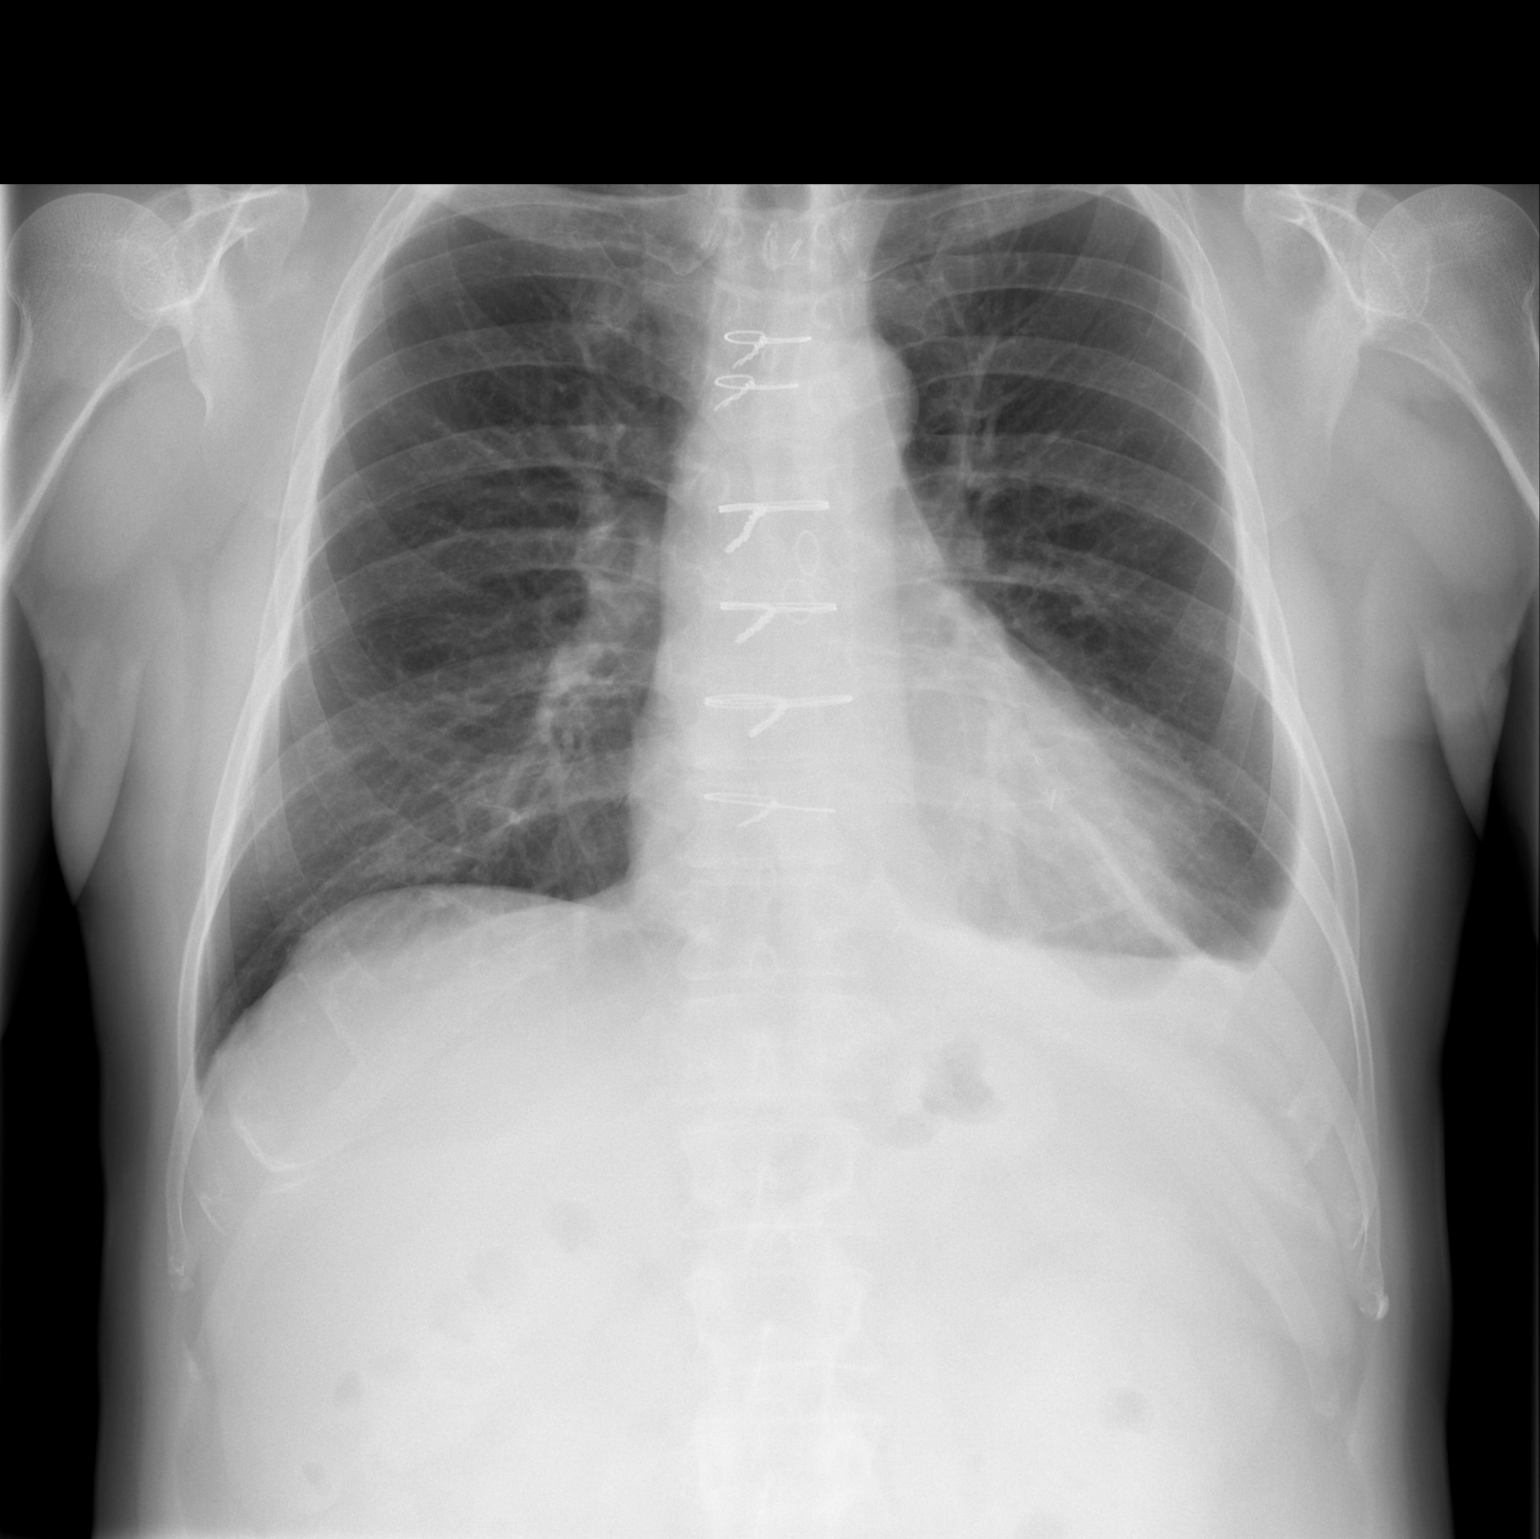

[w chest lat]
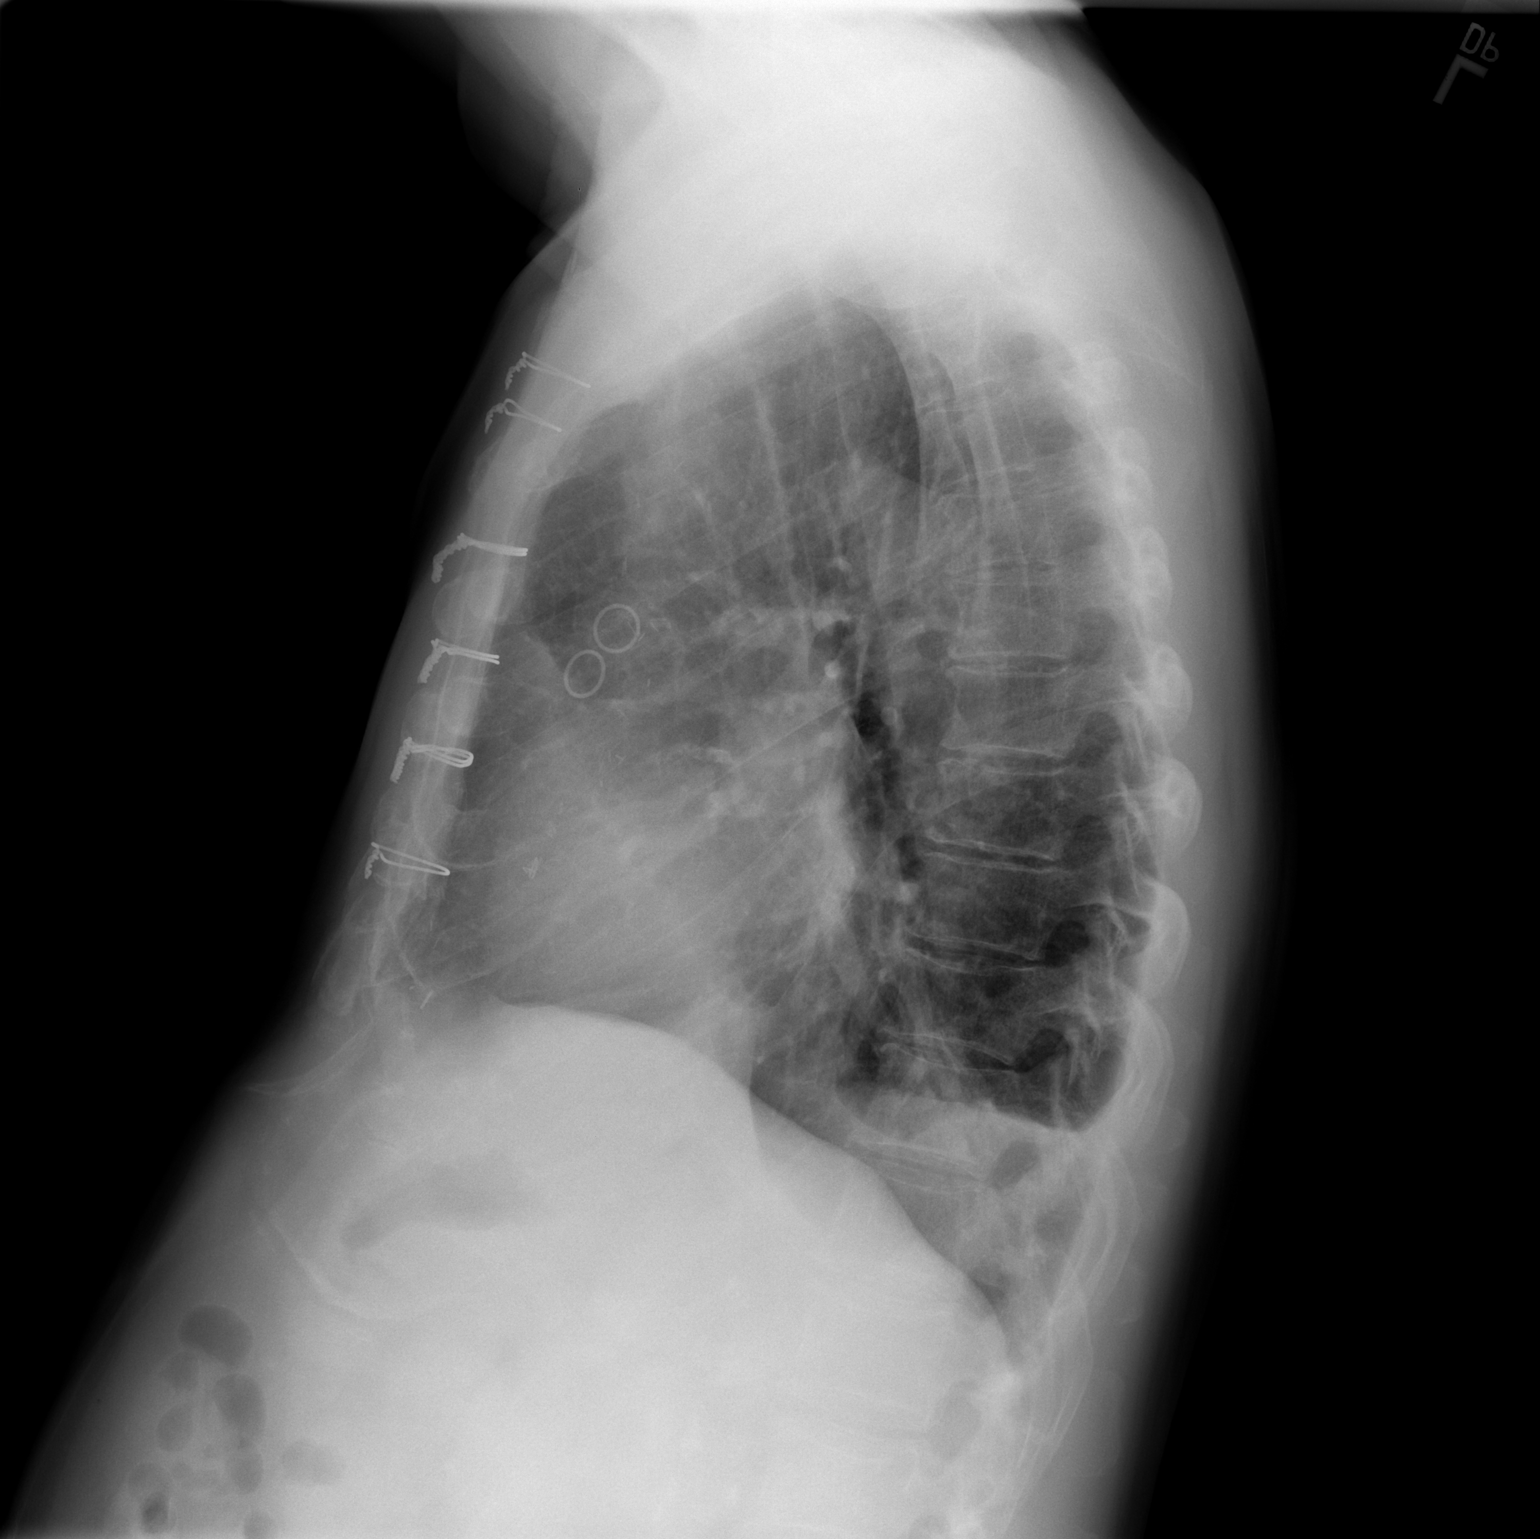

[2 of 2 positions shown; findings below may reference images not displayed]

FINDINGS: Sternotomy wires unchanged. Lungs are adequately inflated and
demonstrate slight worsening small left pleural effusion.
Cardiomediastinal silhouette and remainder the exam is unchanged.
IMPRESSION: Slight worsening small left pleural effusion.

## 2017-03-28 ENCOUNTER — Encounter (HOSPITAL_COMMUNITY)
Admission: RE | Admit: 2017-03-28 | Discharge: 2017-03-28 | Disposition: A | Discharge status: Home / Self Care | Payer: Self-pay | Source: Ambulatory Visit | Attending: Cardiovascular Disease | Admitting: Cardiovascular Disease

## 2017-03-28 ENCOUNTER — Encounter (HOSPITAL_COMMUNITY): Payer: Self-pay

## 2017-03-28 DIAGNOSIS — Z951 Presence of aortocoronary bypass graft: Secondary | ICD-10-CM | POA: Insufficient documentation

## 2017-03-30 ENCOUNTER — Encounter (HOSPITAL_COMMUNITY): Payer: Self-pay

## 2017-04-02 ENCOUNTER — Encounter (HOSPITAL_COMMUNITY)
Admission: RE | Admit: 2017-04-02 | Discharge: 2017-04-02 | Disposition: A | Discharge status: Home / Self Care | Payer: Self-pay | Source: Ambulatory Visit | Attending: Cardiovascular Disease | Admitting: Cardiovascular Disease

## 2017-04-02 ENCOUNTER — Encounter (HOSPITAL_COMMUNITY): Payer: Self-pay

## 2017-04-04 ENCOUNTER — Encounter (HOSPITAL_COMMUNITY)
Admission: RE | Admit: 2017-04-04 | Discharge: 2017-04-04 | Disposition: A | Discharge status: Home / Self Care | Payer: Self-pay | Source: Ambulatory Visit | Attending: Cardiovascular Disease | Admitting: Cardiovascular Disease

## 2017-04-04 ENCOUNTER — Encounter (HOSPITAL_COMMUNITY): Payer: Self-pay

## 2017-04-06 ENCOUNTER — Encounter (HOSPITAL_COMMUNITY): Payer: Self-pay

## 2017-04-06 ENCOUNTER — Encounter (HOSPITAL_COMMUNITY)
Admission: RE | Admit: 2017-04-06 | Discharge: 2017-04-06 | Disposition: A | Discharge status: Home / Self Care | Payer: Self-pay | Source: Ambulatory Visit | Attending: Cardiovascular Disease | Admitting: Cardiovascular Disease

## 2017-04-08 IMAGING — CR DG CHEST 2V
2 series · 2 of 2 positions shown · non-contrast
Comparison: PA and lateral chest x-ray October 30, 2014.

CLINICAL DATA: Status post CABG on October 08, 2014, no current chest
complaints.

EXAM:
CHEST  2 VIEW

[view not recorded (1 of 2)]
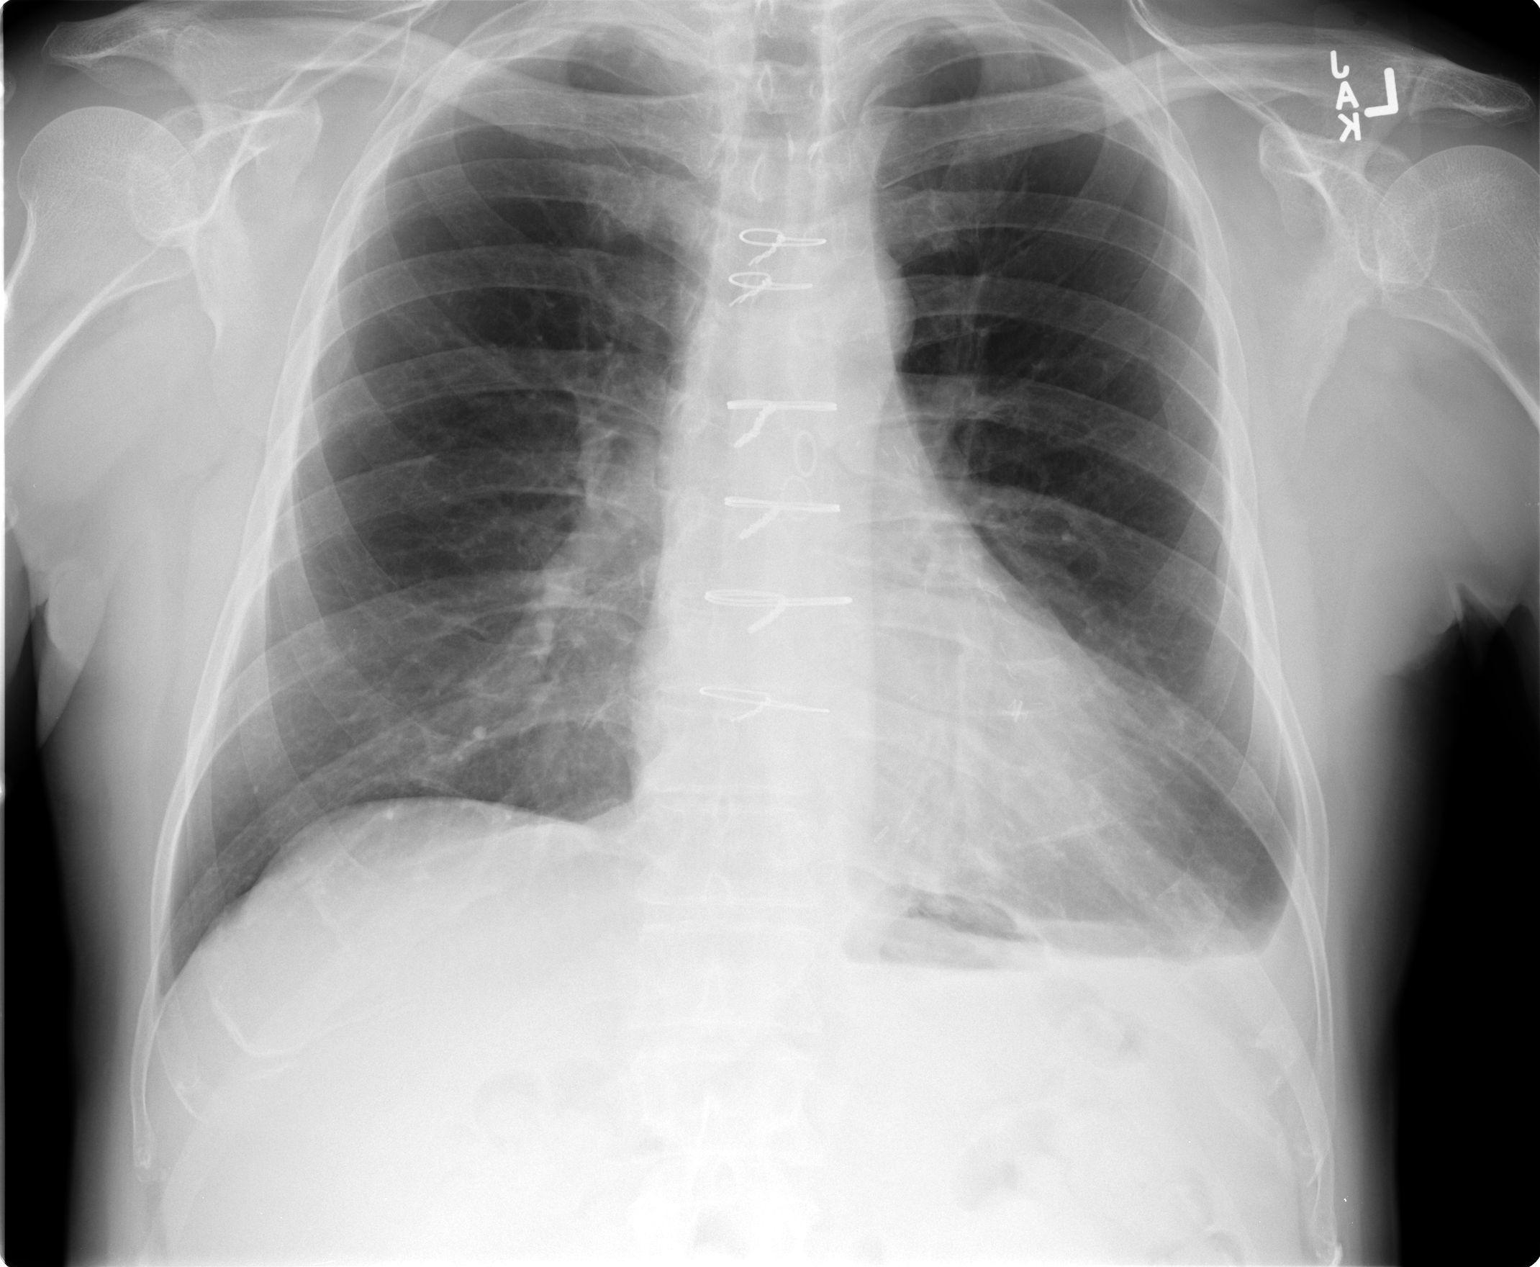

[view not recorded (2 of 2)]
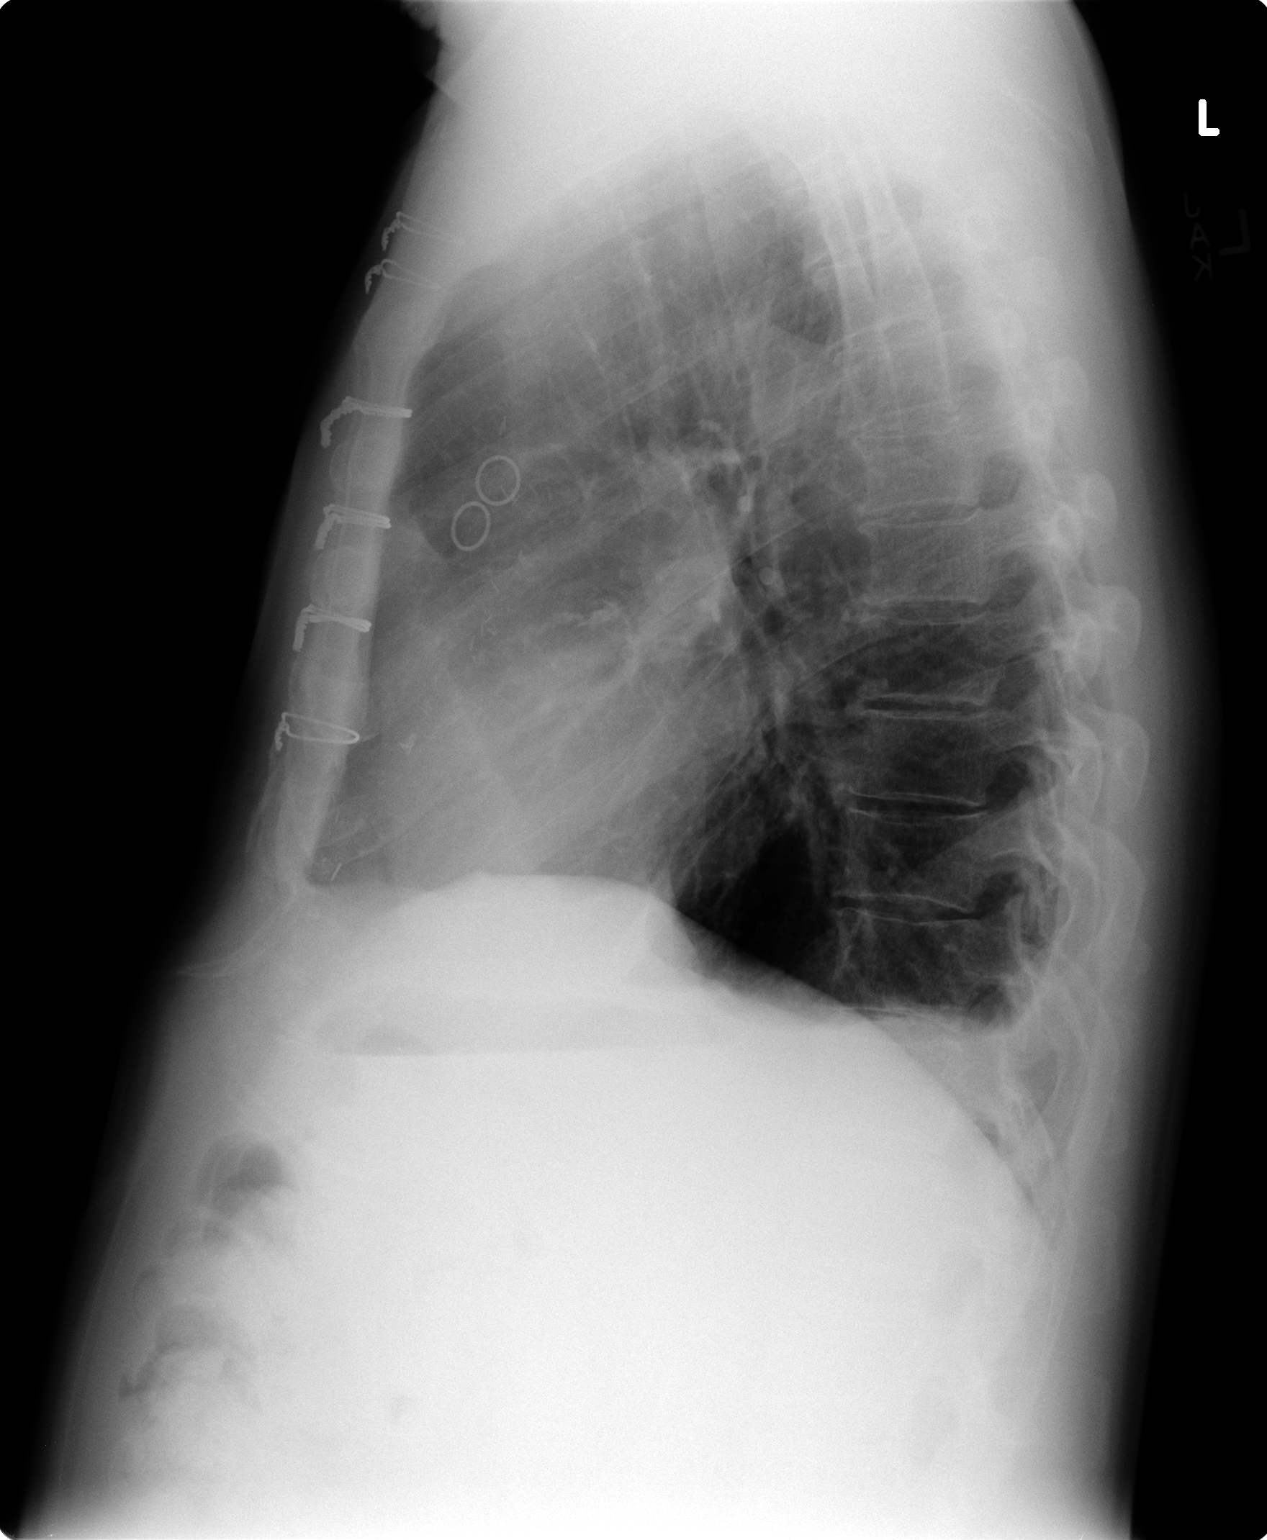

[2 of 2 positions shown; findings below may reference images not displayed]

FINDINGS: The lungs are mildly hyperinflated. There is a persistent small left
pleural effusion which is slightly smaller than on the previous
study. There is no pneumothorax. The heart and mediastinal
structures are normal. The pulmonary vascularity is normal. There
are 6 intact sternal wires.
IMPRESSION: Mild further in a interval decrease in the volume of the left
pleural effusion. There is no evidence of CHF nor other acute
cardiopulmonary abnormality.

## 2017-04-09 ENCOUNTER — Encounter (HOSPITAL_COMMUNITY): Payer: Self-pay

## 2017-04-11 ENCOUNTER — Encounter (HOSPITAL_COMMUNITY)
Admission: RE | Admit: 2017-04-11 | Discharge: 2017-04-11 | Disposition: A | Discharge status: Home / Self Care | Payer: Self-pay | Source: Ambulatory Visit | Attending: Cardiovascular Disease | Admitting: Cardiovascular Disease

## 2017-04-11 ENCOUNTER — Encounter (HOSPITAL_COMMUNITY): Payer: Self-pay

## 2017-04-13 ENCOUNTER — Encounter (HOSPITAL_COMMUNITY): Payer: Self-pay

## 2017-04-13 ENCOUNTER — Encounter (HOSPITAL_COMMUNITY)
Admission: RE | Admit: 2017-04-13 | Discharge: 2017-04-13 | Disposition: A | Discharge status: Home / Self Care | Payer: Self-pay | Source: Ambulatory Visit | Attending: Cardiovascular Disease | Admitting: Cardiovascular Disease

## 2017-04-16 ENCOUNTER — Encounter (HOSPITAL_COMMUNITY)
Admission: RE | Admit: 2017-04-16 | Discharge: 2017-04-16 | Disposition: A | Discharge status: Home / Self Care | Payer: Self-pay | Source: Ambulatory Visit | Attending: Cardiovascular Disease | Admitting: Cardiovascular Disease

## 2017-04-16 ENCOUNTER — Encounter (HOSPITAL_COMMUNITY): Payer: Self-pay

## 2017-04-18 ENCOUNTER — Encounter (HOSPITAL_COMMUNITY)
Admission: RE | Admit: 2017-04-18 | Discharge: 2017-04-18 | Disposition: A | Discharge status: Home / Self Care | Payer: Self-pay | Source: Ambulatory Visit | Attending: Cardiovascular Disease | Admitting: Cardiovascular Disease

## 2017-04-18 ENCOUNTER — Encounter (HOSPITAL_COMMUNITY): Payer: Self-pay

## 2017-04-20 ENCOUNTER — Encounter (HOSPITAL_COMMUNITY)
Admission: RE | Admit: 2017-04-20 | Discharge: 2017-04-20 | Disposition: A | Discharge status: Home / Self Care | Payer: Self-pay | Source: Ambulatory Visit | Attending: Cardiovascular Disease | Admitting: Cardiovascular Disease

## 2017-04-20 ENCOUNTER — Encounter (HOSPITAL_COMMUNITY): Payer: Self-pay

## 2017-04-25 ENCOUNTER — Encounter (HOSPITAL_COMMUNITY)
Admission: RE | Admit: 2017-04-25 | Discharge: 2017-04-25 | Disposition: A | Discharge status: Home / Self Care | Payer: Self-pay | Source: Ambulatory Visit | Attending: Cardiovascular Disease | Admitting: Cardiovascular Disease

## 2017-04-25 DIAGNOSIS — Z951 Presence of aortocoronary bypass graft: Secondary | ICD-10-CM | POA: Insufficient documentation

## 2017-04-27 ENCOUNTER — Encounter (HOSPITAL_COMMUNITY)
Admission: RE | Admit: 2017-04-27 | Discharge: 2017-04-27 | Disposition: A | Discharge status: Home / Self Care | Payer: Self-pay | Source: Ambulatory Visit | Attending: Cardiovascular Disease | Admitting: Cardiovascular Disease

## 2017-04-30 ENCOUNTER — Encounter (HOSPITAL_COMMUNITY)
Admission: RE | Admit: 2017-04-30 | Discharge: 2017-04-30 | Disposition: A | Discharge status: Home / Self Care | Payer: Self-pay | Source: Ambulatory Visit | Attending: Cardiovascular Disease | Admitting: Cardiovascular Disease

## 2017-05-02 ENCOUNTER — Encounter (HOSPITAL_COMMUNITY)
Admission: RE | Admit: 2017-05-02 | Discharge: 2017-05-02 | Disposition: A | Discharge status: Home / Self Care | Payer: Self-pay | Source: Ambulatory Visit | Attending: Cardiovascular Disease | Admitting: Cardiovascular Disease

## 2017-05-04 ENCOUNTER — Encounter (HOSPITAL_COMMUNITY)
Admission: RE | Admit: 2017-05-04 | Discharge: 2017-05-04 | Disposition: A | Discharge status: Home / Self Care | Payer: Self-pay | Source: Ambulatory Visit | Attending: Cardiovascular Disease | Admitting: Cardiovascular Disease

## 2017-05-07 ENCOUNTER — Encounter (HOSPITAL_COMMUNITY)
Admission: RE | Admit: 2017-05-07 | Discharge: 2017-05-07 | Disposition: A | Discharge status: Home / Self Care | Payer: Self-pay | Source: Ambulatory Visit | Attending: Cardiovascular Disease | Admitting: Cardiovascular Disease

## 2017-05-09 ENCOUNTER — Encounter (HOSPITAL_COMMUNITY)
Admission: RE | Admit: 2017-05-09 | Discharge: 2017-05-09 | Disposition: A | Discharge status: Home / Self Care | Payer: Self-pay | Source: Ambulatory Visit | Attending: Cardiovascular Disease | Admitting: Cardiovascular Disease

## 2017-05-11 ENCOUNTER — Encounter (HOSPITAL_COMMUNITY)
Admission: RE | Admit: 2017-05-11 | Discharge: 2017-05-11 | Disposition: A | Discharge status: Home / Self Care | Payer: Self-pay | Source: Ambulatory Visit | Attending: Cardiovascular Disease | Admitting: Cardiovascular Disease

## 2017-05-14 ENCOUNTER — Encounter (HOSPITAL_COMMUNITY)
Admission: RE | Admit: 2017-05-14 | Discharge: 2017-05-14 | Disposition: A | Discharge status: Home / Self Care | Payer: Self-pay | Source: Ambulatory Visit | Attending: Cardiovascular Disease | Admitting: Cardiovascular Disease

## 2017-05-16 ENCOUNTER — Encounter (HOSPITAL_COMMUNITY)
Admission: RE | Admit: 2017-05-16 | Discharge: 2017-05-16 | Disposition: A | Discharge status: Home / Self Care | Payer: Self-pay | Source: Ambulatory Visit | Attending: Cardiovascular Disease | Admitting: Cardiovascular Disease

## 2017-05-18 ENCOUNTER — Encounter (HOSPITAL_COMMUNITY)
Admission: RE | Admit: 2017-05-18 | Discharge: 2017-05-18 | Disposition: A | Discharge status: Home / Self Care | Payer: Self-pay | Source: Ambulatory Visit | Attending: Cardiovascular Disease | Admitting: Cardiovascular Disease

## 2017-05-21 ENCOUNTER — Encounter (HOSPITAL_COMMUNITY)
Admission: RE | Admit: 2017-05-21 | Discharge: 2017-05-21 | Disposition: A | Discharge status: Home / Self Care | Payer: Self-pay | Source: Ambulatory Visit | Attending: Cardiovascular Disease | Admitting: Cardiovascular Disease

## 2017-05-21 NOTE — Progress Notes (Signed)
Cardiology Office Note   Date:  05/22/2017   ID:  Anthony Vasquez, Anthony Vasquez June 21, 1946, MRN [id number]  PCP:  Hali Marry, MD  Cardiologist:   Chilton Si, MD   Chief Complaint  Patient presents with  . Follow-up    1 year     History of Present Illness: Anthony Vasquez is a 71 y.o. male with chronic systolic and diastolic heart failure (LVEF improved from 15% to 45%) , hypertension, hyperlipidemia, CAD s/p CABG who presents for follow up.  Mr. Schiano was previously a patient of Dr. Patty Sermons.  He was admitted 09/2014 with NSTEMI.  He had a LHC that demonstrated LVEF 15-20% and 3 vessel CAD. He underwent coronary artery bypass grafting with Dr. Donata Clay ( (L-LAD, S-RI, S-OM).  Subsequent echo 10/2014 revealed improvement in his EF to 35-40%. He had a subsequent echo 01/2015 that revealed LVEF 45%. He was not started on an ACE inhibitor due to hypotension.  He continues to participate in cardiac rehab regularly.  He participates in rehab 3 days/week and also walks at his church on the track twice weekly.  He feels great with exercise and has no chest pain or shortness of breath.  He also has not noted any lower extremity edema, orthopnea, or PND.  He checks his 12 times per week blood pressure and it typically is in the 120s.  He has not noted any elevated or low blood pressures.  He also denies palpitations, lightheadedness, or dizziness.  Past Medical History:  Diagnosis Date  . Hypertension   . Ischemic cardiomyopathy    Echo 4/16: Mild LVH, EF 35-40%, anteroseptal, apical, inferior, apical anterior anteroapical akinesis, grade 1 diastolic dysfunction, mildly reduced RVSF  . Myocardial infarction [facility]) 09/2014   nstemi  . Renal disorder     Past Surgical History:  Procedure Laterality Date  . COLONOSCOPY  2015  . CORONARY ARTERY BYPASS GRAFT N/A 10/08/2014   Procedure: CORONARY ARTERY BYPASS GRAFTING (CABG);  Surgeon: Kerin Perna, MD;  Location: [facility] OR;  Service: Open Heart  Surgery;  Laterality: N/A;  PATIENT IS GETTING AN IMPELLA IN THE CATH LAB CABG times 3 using left internal mammary artery and endoscopically harvested left saphenous vein.  Marland Kitchen LEFT HEART CATHETERIZATION WITH CORONARY ANGIOGRAM N/A 10/08/2014   Procedure: LEFT HEART CATHETERIZATION WITH CORONARY ANGIOGRAM;  Surgeon: Corky Crafts, MD;  Location: [facility] CATH LAB;  Service: Cardiovascular;  Laterality: N/A;  . TEE WITHOUT CARDIOVERSION N/A 10/08/2014   Procedure: TRANSESOPHAGEAL ECHOCARDIOGRAM (TEE);  Surgeon: Kerin Perna, MD;  Location: [facility] OR;  Service: Open Heart Surgery;  Laterality: N/A;  . TONSILLECTOMY       Current Outpatient Prescriptions  Medication Sig Dispense Refill  . aspirin 81 MG tablet Take 81 mg by mouth daily.    . metoprolol tartrate (LOPRESSOR) 25 MG tablet TAKE 1/2 TABLET BY MOUTH TWICE DAILY 30 tablet 5  . Vitamin D, Ergocalciferol, (DRISDOL) 50000 UNITS CAPS capsule Take 50,000 Units by mouth every 14 (fourteen) days.    . rosuvastatin (CRESTOR) 40 MG tablet Take 1 tablet (40 mg total) by mouth daily. 90 tablet 3   No current facility-administered medications for this visit.     Allergies:   Patient has no known allergies.    Social History:  The patient  reports that he has never smoked. He has never used smokeless tobacco. He reports that he does not drink alcohol or use drugs.   Family History:  The patient's family  history includes Healthy in his brother; Heart attack in his mother; Heart disease in his father and mother.    ROS:  Please see the history of present illness.   Otherwise, review of systems are positive for none.   All other systems are reviewed and negative.    PHYSICAL EXAM: VS:  BP 132/76   Pulse (!) 54   Ht 5\' 6"  (1.676 m)   Wt 71.3 kg (157 lb 3.2 oz)   BMI 25.37 kg/m  , BMI Body mass index is 25.37 kg/m. GENERAL:  Well appearing.  No acute distress HEENT: Pupils equal round and reactive, fundi not visualized, oral mucosa  unremarkable NECK:  No jugular venous distention, waveform within normal limits, carotid upstroke brisk and symmetric, no bruits, no thyromegaly LUNGS:  Clear to auscultation bilaterally.  No crackles, wheezes, or rhonchi. HEART:  RRR.  PMI not displaced or sustained,S1 and S2 within normal limits, no S3, no S4, no clicks, no rubs, no murmurs ABD:  Flat, positive bowel sounds normal in frequency in pitch, no bruits, no rebound, no guarding, no midline pulsatile mass, no hepatomegaly, no splenomegaly EXT:  2 plus pulses throughout, no edema, no cyanosis no clubbing SKIN:  No rashes no nodules NEURO:  Cranial nerves II through XII grossly intact, motor grossly intact throughout PSYCH:  Cognitively intact, oriented to person place and time    EKG:  EKG is ordered today. The ekg ordered 05/30/16 demonstrates sinus rhythm rate 68 bpm.  Prior anteroseptal infarct 05/22/17. Sinus bradycardia.  Rate 54 bpm.  Anteroseptal  infarct.  Lateral T wave flattening.   Recent Labs: No results found for requested labs within last 8760 hours.    Lipid Panel    Component Value Date/Time   CHOL 143 10/08/2014 0528   TRIG 88 10/08/2014 0528   HDL 36 (L) 10/08/2014 0528   CHOLHDL 4.0 10/08/2014 0528   VLDL 18 10/08/2014 0528   LDLCALC 89 10/08/2014 0528   11/20/16: Sodium 138, potassium 4.7, BUN 15, creatinine 1.22 AST 25, ALT 23 Total cholesterol 153, triglycerides 97, HDL 41, LDL 95  Wt Readings from Last 3 Encounters:  05/22/17 71.3 kg (157 lb 3.2 oz)  05/30/16 72.4 kg (159 lb 9.6 oz)  11/23/15 70.2 kg (154 lb 12.8 oz)      ASSESSMENT AND PLAN:  # CAD s/p CABG: Mr. Ulice BrilliantDrake is doing exceptionally well.  He has no angina.  Continue aspirin and metoprolol.  # Chronic systolic and diastolic heart failure: Stable.  He is euvolemic.  Continue metoprolol.  # Hypertension: Blood pressure is well-controlled.  Continue metoprolol.  # Hyperlipidemia: LDL was 95.  His goal is less than 70.  We will  switch pravastatin to rosuvastatin 40 mg daily.  He is due to have lipids rechecked with his PCP next month.   Current medicines are reviewed at length with the patient today.  The patient does not have concerns regarding medicines.  The following changes have been made:  no change  Labs/ tests ordered today include:   Orders Placed This Encounter  Procedures  . EKG 12-Lead     Disposition:   FU with Kalayah Leske C. Duke Salviaandolph, MD, [facility] in 1 year.     This note was written with the assistance of speech recognition software.  Please excuse any transcriptional errors.  Signed, Orazio Weller C. Duke Salviaandolph, MD, [facility]  05/22/2017 8:25 AM    [facility]

## 2017-05-22 ENCOUNTER — Ambulatory Visit (INDEPENDENT_AMBULATORY_CARE_PROVIDER_SITE_OTHER): Payer: Medicare Other | Admitting: Cardiovascular Disease

## 2017-05-22 ENCOUNTER — Other Ambulatory Visit: Payer: Self-pay | Admitting: Physician Assistant

## 2017-05-22 ENCOUNTER — Encounter: Payer: Self-pay | Admitting: Cardiovascular Disease

## 2017-05-22 VITALS — BP 132/76 | HR 54 | Ht 66.0 in | Wt 157.2 lb

## 2017-05-22 DIAGNOSIS — E7800 Pure hypercholesterolemia, unspecified: Secondary | ICD-10-CM

## 2017-05-22 DIAGNOSIS — I5042 Chronic combined systolic (congestive) and diastolic (congestive) heart failure: Secondary | ICD-10-CM | POA: Diagnosis not present

## 2017-05-22 DIAGNOSIS — I2510 Atherosclerotic heart disease of native coronary artery without angina pectoris: Secondary | ICD-10-CM

## 2017-05-22 MED ORDER — ROSUVASTATIN CALCIUM 40 MG PO TABS: 40.0000 mg | ORAL_TABLET | Freq: Every day | ORAL | 3 refills | Status: DC

## 2017-05-22 NOTE — Patient Instructions (Signed)
Medication Instructions:  STOP PRAVASTATIN   START ROSUVASTATIN 40 MG DAILY   Labwork: HAVE YOUR PRIMARY CARE PHYSICIAN SEND A COPY OF YOUR LABS TO [phone] WHEN YOU HAVE THEM DONE  Testing/Procedures: NONE  Follow-Up: Your physician wants you to follow-up in:  1 YEAR OV  You will receive a reminder letter in the mail two months in advance. If you don't receive a letter, please call our office to schedule the follow-up appointment.  If you need a refill on your cardiac medications before your next appointment, please call your pharmacy.

## 2017-05-22 NOTE — Telephone Encounter (Signed)
Rx(s) sent to pharmacy electronically.  

## 2017-05-23 ENCOUNTER — Encounter (HOSPITAL_COMMUNITY)
Admission: RE | Admit: 2017-05-23 | Discharge: 2017-05-23 | Disposition: A | Discharge status: Home / Self Care | Payer: Self-pay | Source: Ambulatory Visit | Attending: Cardiovascular Disease | Admitting: Cardiovascular Disease

## 2017-05-25 ENCOUNTER — Encounter (HOSPITAL_COMMUNITY)
Admission: RE | Admit: 2017-05-25 | Discharge: 2017-05-25 | Disposition: A | Discharge status: Home / Self Care | Payer: Self-pay | Source: Ambulatory Visit | Attending: Cardiovascular Disease | Admitting: Cardiovascular Disease

## 2017-05-25 DIAGNOSIS — Z951 Presence of aortocoronary bypass graft: Secondary | ICD-10-CM | POA: Insufficient documentation

## 2017-05-28 ENCOUNTER — Encounter (HOSPITAL_COMMUNITY): Payer: Self-pay

## 2017-05-30 ENCOUNTER — Encounter (HOSPITAL_COMMUNITY)
Admission: RE | Admit: 2017-05-30 | Discharge: 2017-05-30 | Disposition: A | Discharge status: Home / Self Care | Payer: TRICARE For Life (TFL) | Source: Ambulatory Visit | Attending: Cardiovascular Disease | Admitting: Cardiovascular Disease

## 2017-05-31 ENCOUNTER — Ambulatory Visit: Payer: Medicare Other | Admitting: Cardiovascular Disease

## 2017-06-01 ENCOUNTER — Encounter (HOSPITAL_COMMUNITY): Payer: Self-pay

## 2017-06-04 ENCOUNTER — Encounter (HOSPITAL_COMMUNITY)
Admission: RE | Admit: 2017-06-04 | Discharge: 2017-06-04 | Disposition: A | Discharge status: Home / Self Care | Payer: Self-pay | Source: Ambulatory Visit | Attending: Cardiovascular Disease | Admitting: Cardiovascular Disease

## 2017-06-06 ENCOUNTER — Encounter (HOSPITAL_COMMUNITY)
Admission: RE | Admit: 2017-06-06 | Discharge: 2017-06-06 | Disposition: A | Discharge status: Home / Self Care | Payer: Self-pay | Source: Ambulatory Visit | Attending: Cardiovascular Disease | Admitting: Cardiovascular Disease

## 2017-06-08 ENCOUNTER — Encounter (HOSPITAL_COMMUNITY): Payer: Self-pay

## 2017-06-11 ENCOUNTER — Encounter (HOSPITAL_COMMUNITY)
Admission: RE | Admit: 2017-06-11 | Discharge: 2017-06-11 | Disposition: A | Discharge status: Home / Self Care | Payer: Self-pay | Source: Ambulatory Visit | Attending: Cardiovascular Disease | Admitting: Cardiovascular Disease

## 2017-06-13 ENCOUNTER — Encounter (HOSPITAL_COMMUNITY): Payer: Self-pay

## 2017-06-18 ENCOUNTER — Encounter (HOSPITAL_COMMUNITY)
Admission: RE | Admit: 2017-06-18 | Discharge: 2017-06-18 | Disposition: A | Discharge status: Home / Self Care | Payer: Self-pay | Source: Ambulatory Visit | Attending: Cardiovascular Disease | Admitting: Cardiovascular Disease

## 2017-06-20 ENCOUNTER — Encounter (HOSPITAL_COMMUNITY)
Admission: RE | Admit: 2017-06-20 | Discharge: 2017-06-20 | Disposition: A | Discharge status: Home / Self Care | Payer: Self-pay | Source: Ambulatory Visit | Attending: Cardiovascular Disease | Admitting: Cardiovascular Disease

## 2017-06-22 ENCOUNTER — Encounter (HOSPITAL_COMMUNITY)
Admission: RE | Admit: 2017-06-22 | Discharge: 2017-06-22 | Disposition: A | Discharge status: Home / Self Care | Payer: Self-pay | Source: Ambulatory Visit | Attending: Cardiovascular Disease | Admitting: Cardiovascular Disease

## 2017-06-25 ENCOUNTER — Encounter (HOSPITAL_COMMUNITY)
Admission: RE | Admit: 2017-06-25 | Discharge: 2017-06-25 | Disposition: A | Discharge status: Home / Self Care | Payer: Self-pay | Source: Ambulatory Visit | Attending: Cardiovascular Disease | Admitting: Cardiovascular Disease

## 2017-06-25 DIAGNOSIS — Z951 Presence of aortocoronary bypass graft: Secondary | ICD-10-CM | POA: Insufficient documentation

## 2017-06-27 ENCOUNTER — Encounter (HOSPITAL_COMMUNITY)
Admission: RE | Admit: 2017-06-27 | Discharge: 2017-06-27 | Disposition: A | Discharge status: Home / Self Care | Payer: Self-pay | Source: Ambulatory Visit | Attending: Cardiovascular Disease | Admitting: Cardiovascular Disease

## 2017-06-29 ENCOUNTER — Encounter (HOSPITAL_COMMUNITY): Payer: Self-pay

## 2017-07-02 ENCOUNTER — Encounter (HOSPITAL_COMMUNITY): Payer: Self-pay

## 2017-07-04 ENCOUNTER — Encounter (HOSPITAL_COMMUNITY): Payer: Self-pay

## 2017-07-06 ENCOUNTER — Encounter (HOSPITAL_COMMUNITY)
Admission: RE | Admit: 2017-07-06 | Discharge: 2017-07-06 | Disposition: A | Discharge status: Home / Self Care | Payer: TRICARE For Life (TFL) | Source: Ambulatory Visit | Attending: Cardiovascular Disease | Admitting: Cardiovascular Disease

## 2017-07-09 ENCOUNTER — Encounter (HOSPITAL_COMMUNITY)
Admission: RE | Admit: 2017-07-09 | Discharge: 2017-07-09 | Disposition: A | Discharge status: Home / Self Care | Payer: Self-pay | Source: Ambulatory Visit | Attending: Cardiovascular Disease | Admitting: Cardiovascular Disease

## 2017-07-11 ENCOUNTER — Encounter (HOSPITAL_COMMUNITY)
Admission: RE | Admit: 2017-07-11 | Discharge: 2017-07-11 | Disposition: A | Discharge status: Home / Self Care | Payer: TRICARE For Life (TFL) | Source: Ambulatory Visit | Attending: Cardiovascular Disease | Admitting: Cardiovascular Disease

## 2017-07-13 ENCOUNTER — Encounter (HOSPITAL_COMMUNITY)
Admission: RE | Admit: 2017-07-13 | Discharge: 2017-07-13 | Disposition: A | Discharge status: Home / Self Care | Payer: Self-pay | Source: Ambulatory Visit | Attending: Cardiovascular Disease | Admitting: Cardiovascular Disease

## 2017-07-18 ENCOUNTER — Encounter (HOSPITAL_COMMUNITY)
Admission: RE | Admit: 2017-07-18 | Discharge: 2017-07-18 | Disposition: A | Discharge status: Home / Self Care | Payer: TRICARE For Life (TFL) | Source: Ambulatory Visit | Attending: Cardiovascular Disease | Admitting: Cardiovascular Disease

## 2017-07-20 ENCOUNTER — Encounter (HOSPITAL_COMMUNITY): Payer: Self-pay

## 2017-07-23 ENCOUNTER — Encounter (HOSPITAL_COMMUNITY)
Admission: RE | Admit: 2017-07-23 | Discharge: 2017-07-23 | Disposition: A | Discharge status: Home / Self Care | Payer: Self-pay | Source: Ambulatory Visit | Attending: Cardiovascular Disease | Admitting: Cardiovascular Disease

## 2017-07-25 ENCOUNTER — Encounter (HOSPITAL_COMMUNITY): Payer: Self-pay

## 2017-07-27 ENCOUNTER — Encounter (HOSPITAL_COMMUNITY): Payer: Self-pay

## 2017-07-30 ENCOUNTER — Encounter (HOSPITAL_COMMUNITY): Payer: Self-pay

## 2017-08-01 ENCOUNTER — Encounter (HOSPITAL_COMMUNITY): Payer: Self-pay

## 2017-08-03 ENCOUNTER — Encounter (HOSPITAL_COMMUNITY): Payer: Self-pay

## 2017-08-06 ENCOUNTER — Encounter (HOSPITAL_COMMUNITY): Payer: Self-pay

## 2017-08-08 ENCOUNTER — Encounter (HOSPITAL_COMMUNITY): Payer: Self-pay

## 2017-08-10 ENCOUNTER — Encounter (HOSPITAL_COMMUNITY): Payer: Self-pay

## 2017-08-13 ENCOUNTER — Encounter (HOSPITAL_COMMUNITY): Payer: Self-pay

## 2017-08-15 ENCOUNTER — Encounter (HOSPITAL_COMMUNITY): Payer: Self-pay

## 2017-08-17 ENCOUNTER — Encounter (HOSPITAL_COMMUNITY): Payer: Self-pay

## 2017-08-20 ENCOUNTER — Encounter (HOSPITAL_COMMUNITY): Payer: Self-pay

## 2017-08-22 ENCOUNTER — Encounter (HOSPITAL_COMMUNITY): Payer: Self-pay

## 2017-08-24 ENCOUNTER — Encounter (HOSPITAL_COMMUNITY): Payer: Self-pay

## 2017-08-27 ENCOUNTER — Encounter (HOSPITAL_COMMUNITY): Payer: Self-pay

## 2017-08-29 ENCOUNTER — Encounter (HOSPITAL_COMMUNITY): Payer: Self-pay

## 2017-08-31 ENCOUNTER — Encounter (HOSPITAL_COMMUNITY): Payer: Self-pay

## 2017-09-03 ENCOUNTER — Encounter (HOSPITAL_COMMUNITY): Payer: Self-pay

## 2017-09-05 ENCOUNTER — Encounter (HOSPITAL_COMMUNITY): Payer: Self-pay

## 2017-09-07 ENCOUNTER — Encounter (HOSPITAL_COMMUNITY): Payer: Self-pay

## 2017-09-10 ENCOUNTER — Encounter (HOSPITAL_COMMUNITY): Payer: Self-pay

## 2017-09-12 ENCOUNTER — Encounter (HOSPITAL_COMMUNITY): Payer: Self-pay

## 2017-09-14 ENCOUNTER — Encounter (HOSPITAL_COMMUNITY): Payer: Self-pay

## 2017-09-17 ENCOUNTER — Encounter (HOSPITAL_COMMUNITY): Payer: Self-pay

## 2017-09-19 ENCOUNTER — Encounter (HOSPITAL_COMMUNITY): Payer: Self-pay

## 2017-09-21 ENCOUNTER — Encounter (HOSPITAL_COMMUNITY): Payer: Self-pay

## 2017-09-24 ENCOUNTER — Encounter (HOSPITAL_COMMUNITY): Payer: Self-pay

## 2017-09-26 ENCOUNTER — Encounter (HOSPITAL_COMMUNITY): Payer: Self-pay

## 2017-09-28 ENCOUNTER — Encounter (HOSPITAL_COMMUNITY): Payer: Self-pay

## 2017-10-01 ENCOUNTER — Encounter (HOSPITAL_COMMUNITY): Payer: Self-pay

## 2017-10-03 ENCOUNTER — Encounter (HOSPITAL_COMMUNITY): Payer: Self-pay

## 2017-10-05 ENCOUNTER — Encounter (HOSPITAL_COMMUNITY): Payer: Self-pay

## 2017-10-08 ENCOUNTER — Encounter (HOSPITAL_COMMUNITY): Payer: Self-pay

## 2017-10-10 ENCOUNTER — Encounter (HOSPITAL_COMMUNITY): Payer: Self-pay

## 2017-10-12 ENCOUNTER — Encounter (HOSPITAL_COMMUNITY): Payer: Self-pay

## 2017-10-15 ENCOUNTER — Encounter (HOSPITAL_COMMUNITY): Payer: Self-pay

## 2017-10-17 ENCOUNTER — Encounter (HOSPITAL_COMMUNITY): Payer: Self-pay

## 2017-10-19 ENCOUNTER — Encounter (HOSPITAL_COMMUNITY): Payer: Self-pay

## 2017-10-22 ENCOUNTER — Encounter (HOSPITAL_COMMUNITY): Payer: Self-pay

## 2017-10-24 ENCOUNTER — Encounter (HOSPITAL_COMMUNITY): Payer: Self-pay

## 2017-10-26 ENCOUNTER — Encounter (HOSPITAL_COMMUNITY): Payer: Self-pay

## 2017-10-29 ENCOUNTER — Encounter (HOSPITAL_COMMUNITY): Payer: Self-pay

## 2017-10-31 ENCOUNTER — Encounter (HOSPITAL_COMMUNITY): Payer: Self-pay

## 2017-11-02 ENCOUNTER — Encounter (HOSPITAL_COMMUNITY): Payer: Self-pay

## 2017-11-05 ENCOUNTER — Encounter (HOSPITAL_COMMUNITY): Payer: Self-pay

## 2017-11-07 ENCOUNTER — Encounter (HOSPITAL_COMMUNITY): Payer: Self-pay

## 2017-11-09 ENCOUNTER — Encounter (HOSPITAL_COMMUNITY): Payer: Self-pay

## 2017-11-12 ENCOUNTER — Encounter (HOSPITAL_COMMUNITY): Payer: Self-pay

## 2017-11-14 ENCOUNTER — Encounter (HOSPITAL_COMMUNITY): Payer: Self-pay

## 2017-11-16 ENCOUNTER — Encounter (HOSPITAL_COMMUNITY): Payer: Self-pay

## 2017-11-19 ENCOUNTER — Encounter (HOSPITAL_COMMUNITY): Payer: Self-pay

## 2018-01-04 ENCOUNTER — Other Ambulatory Visit: Payer: Self-pay | Admitting: Cardiovascular Disease

## 2018-05-15 ENCOUNTER — Other Ambulatory Visit: Payer: Self-pay | Admitting: Cardiovascular Disease

## 2018-05-27 ENCOUNTER — Encounter: Payer: Self-pay | Admitting: Cardiovascular Disease

## 2018-05-27 ENCOUNTER — Ambulatory Visit (INDEPENDENT_AMBULATORY_CARE_PROVIDER_SITE_OTHER): Payer: Medicare Other | Admitting: Cardiovascular Disease

## 2018-05-27 VITALS — BP 116/72 | HR 52 | Ht 66.0 in | Wt 156.2 lb

## 2018-05-27 DIAGNOSIS — E7800 Pure hypercholesterolemia, unspecified: Secondary | ICD-10-CM

## 2018-05-27 DIAGNOSIS — I2510 Atherosclerotic heart disease of native coronary artery without angina pectoris: Secondary | ICD-10-CM | POA: Diagnosis not present

## 2018-05-27 MED ORDER — METOPROLOL SUCCINATE ER 25 MG PO TB24: ORAL_TABLET | ORAL | 3 refills | Status: DC

## 2018-05-27 NOTE — Patient Instructions (Signed)
Medication Instructions:  STOP METOPROLOL TART   START METOPROLOL SUCC 25 MG 1/2 TABLET DAILY   If you need a refill on your cardiac medications before your next appointment, please call your pharmacy.   Lab work: NONE  Testing/Procedures: NONE  Follow-Up: At [facility], you and your health needs are our priority.  As part of our continuing mission to provide you with exceptional heart care, we have created designated Provider Care Teams.  These Care Teams include your primary Cardiologist (physician) and Advanced Practice Providers (APPs -  Physician Assistants and Nurse Practitioners) who all work together to provide you with the care you need, when you need it. You will need a follow up appointment in 12 months.  Please call our office 2 months in advance to schedule this appointment.  You may see DR [facility] or one of the following Advanced Practice Providers on your designated Care Team:   Corine Shelter, PA-C Judy Pimple, [place] . Marjie Skiff, PA-C

## 2018-05-27 NOTE — Progress Notes (Signed)
Cardiology Office Note   Date:  05/27/2018   ID:  Mervyn, Pflaum 11-28-1945, MRN [id number]  PCP:  Hali Marry, MD  Cardiologist:   Chilton Si, MD   No chief complaint on file.    History of Present Illness: Anthony Vasquez is a 72 y.o. male with chronic systolic and diastolic heart failure (LVEF improved from 15% to 45%) , hypertension, hyperlipidemia, CAD s/p CABG who presents for follow up.  Anthony Vasquez was previously a patient of Dr. Patty Sermons.  He was admitted 09/2014 with NSTEMI.  He had a LHC that demonstrated LVEF 15-20% and 3 vessel CAD. He underwent coronary artery bypass grafting with Dr. Donata Clay ( (L-LAD, S-RI, S-OM).  Subsequent echo 10/2014 revealed improvement in his EF to 35-40%. He had a subsequent echo 01/2015 that revealed LVEF 45%. He was not started on an ACE inhibitor due to hypotension.  At his last appointment he was doing well.  Pravastatin was switched to rosuvastatin.  Since then his lipids have been well-controlled.  He has not had any issues with myalgias.  He continues to walk 2 miles per day.  It takes him about 33 minutes to do this.  He has no exertional chest pain or shortness of breath.  He also denies lower extremity edema, orthopnea, or PND.  He has some mild lightheadedness when he stands quickly but denies any syncope.  He denies palpitations.  He watches his diet and limits his salt intake.  Overall he has been doing well.  He [place] in about 1 year.  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   Past Medical History:  Diagnosis Date  . Hypertension   . Ischemic cardiomyopathy    Echo 4/16: Mild LVH, EF 35-40%, anteroseptal, apical, inferior, apical anterior anteroapical akinesis, grade 1 diastolic dysfunction, mildly reduced RVSF  . Myocardial infarction [facility]) 09/2014   nstemi  . Renal disorder     Past Surgical History:  Procedure Laterality Date  . COLONOSCOPY  2015  .  CORONARY ARTERY BYPASS GRAFT N/A 10/08/2014   Procedure: CORONARY ARTERY BYPASS GRAFTING (CABG);  Surgeon: Kerin Perna, MD;  Location: [facility] OR;  Service: Open Heart Surgery;  Laterality: N/A;  PATIENT IS GETTING AN IMPELLA IN THE CATH LAB CABG times 3 using left internal mammary artery and endoscopically harvested left saphenous vein.  Marland Kitchen LEFT HEART CATHETERIZATION WITH CORONARY ANGIOGRAM N/A 10/08/2014   Procedure: LEFT HEART CATHETERIZATION WITH CORONARY ANGIOGRAM;  Surgeon: Corky Crafts, MD;  Location: [facility] CATH LAB;  Service: Cardiovascular;  Laterality: N/A;  . TEE WITHOUT CARDIOVERSION N/A 10/08/2014   Procedure: TRANSESOPHAGEAL ECHOCARDIOGRAM (TEE);  Surgeon: Kerin Perna, MD;  Location: [facility] OR;  Service: Open Heart Surgery;  Laterality: N/A;  . TONSILLECTOMY       Current Outpatient Medications  Medication Sig Dispense Refill  . aspirin 81 MG tablet Take 81 mg by mouth daily.    . rosuvastatin (CRESTOR) 40 MG tablet TAKE 1 TABLET BY MOUTH DAILY 90 tablet 1  . Vitamin D, Ergocalciferol, (DRISDOL) 50000 UNITS CAPS capsule Take 50,000 Units by mouth every 14 (fourteen) days.    . metoprolol succinate (TOPROL XL) 25 MG 24 hr tablet TAKE 1/2 TABLET BY MOUTH DAILY 45 tablet 3   No current facility-administered medications for this visit.     Allergies:   Patient has no known allergies.    Social History:  The patient  reports that he has never smoked. He has never used smokeless tobacco. He reports that he does not drink alcohol or use drugs.   Family History:  The patient's family history includes Healthy in his brother; Heart attack in his mother; Heart disease in his father and mother.    ROS:  Please see the history of present illness.   Otherwise, review of systems are positive for none.   All other systems are reviewed and negative.    PHYSICAL EXAM: VS:  BP 116/72   Pulse (!) 52   Ht 5\' 6"  (1.676 m)   Wt 156 lb 3.2 oz (70.9 kg)   BMI 25.21 kg/m  , BMI Body mass  index is 25.21 kg/m. GENERAL:  Well appearing HEENT: Pupils equal round and reactive, fundi not visualized, oral mucosa unremarkable NECK:  No jugular venous distention, waveform within normal limits, carotid upstroke brisk and symmetric, no bruits, no thyromegaly LYMPHATICS:  No cervical adenopathy LUNGS:  Clear to auscultation bilaterally HEART: Bradycardic.  Regular rhythm.  PMI not displaced or sustained,S1 and S2 within normal limits, no S3, no S4, no clicks, no rubs, no murmurs ABD:  Flat, positive bowel sounds normal in frequency in pitch, no bruits, no rebound, no guarding, no midline pulsatile mass, no hepatomegaly, no splenomegaly EXT:  2 plus pulses throughout, no edema, no cyanosis no clubbing SKIN:  No rashes no nodules NEURO:  Cranial nerves II through XII grossly intact, motor grossly intact throughout PSYCH:  Cognitively intact, oriented to person place and time   EKG:  EKG is ordered today. The ekg ordered 05/30/16 demonstrates sinus rhythm rate 68 bpm.  Prior anteroseptal infarct 05/22/17. Sinus bradycardia.  Rate 54 bpm.  Anteroseptal  infarct.  Lateral T wave flattening. 05/27/2018: Sinus bradycardia.  Rate 52 bpm.  LAFB.  Nonspecific T wave abnormalities.  Echo 02/10/2015: Study Conclusions  - Left ventricle: Septal apical and distal anterior wall   hypokinesis. The cavity size was mildly dilated. Wall thickness   was normal. The estimated ejection fraction was 45%. - Left atrium: The atrium was mildly dilated. - Atrial septum: There was increased thickness of the septum,   consistent with lipomatous hypertrophy. No defect or patent   foramen ovale was identified.  Recent Labs: No results found for requested labs within last 8760 hours.   12/07/2017: Sodium 139, potassium 4.7, BUN 14, creatinine 1.16 AST 34, ALT 34 Total cholesterol 105, triglycerides 59, HDL 39, LDL 53   Lipid Panel    Component Value Date/Time   CHOL 143 10/08/2014 0528   TRIG 88  10/08/2014 0528   HDL 36 (L) 10/08/2014 0528   CHOLHDL 4.0 10/08/2014 0528   VLDL 18 10/08/2014 0528   LDLCALC 89 10/08/2014 0528   11/20/16: Sodium 138, potassium 4.7, BUN 15, creatinine 1.22 AST 25, ALT 23 Total cholesterol 153, triglycerides 97, HDL 41, LDL 95  Wt Readings from Last 3 Encounters:  05/27/18 156 lb 3.2 oz (70.9 kg)  05/22/17 157 lb 3.2 oz (71.3 kg)  05/30/16 159 lb 9.6 oz (72.4 kg)      ASSESSMENT AND PLAN:  # CAD s/p CABG:  # Hyperlipidemia:  Anthony Vasquez continues to do well.  He exercises regularly and has no chest pain or shortness of breath.  Continue aspirin and rosuvastatin.  LDL was 53 11/2017.  Given that he has some lightheadedness and he is both bradycardic and his blood pressure is on the low side, we will switch metoprolol to  metoprolol succinate 12.5 mg daily.  # Chronic systolic and diastolic heart failure:  LVEF 44%.  Switch metoprolol to succinate as above.  He has not been on ACE inhibitor or ARB due to low blood pressure.  He is euvolemic and doing well.  He continues to monitor his sodium intake and his weight has been stable.  # Hypertension: Blood pressure is well-controlled.  Reduce metoprolol as above.   Current medicines are reviewed at length with the patient today.  The patient does not have concerns regarding medicines.  The following changes have been made:  no change  Labs/ tests ordered today include:   Orders Placed This Encounter  Procedures  . EKG 12-Lead     Disposition:   FU with Erskine Steinfeldt C. Duke Salvia, MD, [facility] in 1 year.       Signed, Arien Benincasa C. Duke Salvia, MD, [facility]  05/27/2018 8:16 AM    [facility]

## 2018-08-13 ENCOUNTER — Other Ambulatory Visit: Payer: Self-pay | Admitting: Cardiovascular Disease

## 2019-05-15 ENCOUNTER — Other Ambulatory Visit: Payer: Self-pay | Admitting: Cardiovascular Disease

## 2019-06-03 ENCOUNTER — Telehealth (INDEPENDENT_AMBULATORY_CARE_PROVIDER_SITE_OTHER): Payer: Medicare Other | Admitting: Cardiovascular Disease

## 2019-06-03 ENCOUNTER — Encounter: Payer: Self-pay | Admitting: Cardiovascular Disease

## 2019-06-03 VITALS — BP 117/62 | HR 57 | Ht 66.0 in | Wt 155.0 lb

## 2019-06-03 DIAGNOSIS — I10 Essential (primary) hypertension: Secondary | ICD-10-CM

## 2019-06-03 DIAGNOSIS — Z951 Presence of aortocoronary bypass graft: Secondary | ICD-10-CM

## 2019-06-03 DIAGNOSIS — I5042 Chronic combined systolic (congestive) and diastolic (congestive) heart failure: Secondary | ICD-10-CM

## 2019-06-03 NOTE — Patient Instructions (Signed)
Medication Instructions:  Your physician recommends that you continue on your current medications as directed. Please refer to the Current Medication list given to you today.  *If you need a refill on your cardiac medications before your next appointment, please call your pharmacy*  Lab Work: NONE   Testing/Procedures: NONE   Follow-Up: At [facility], you and your health needs are our priority.  As part of our continuing mission to provide you with exceptional heart care, we have created designated Provider Care Teams.  These Care Teams include your primary Cardiologist (physician) and Advanced Practice Providers (APPs -  Physician Assistants and Nurse Practitioners) who all work together to provide you with the care you need, when you need it.  Your next appointment:   12 month(s) You will receive a reminder letter in the mail two months in advance. If you don't receive a letter, please call our office to schedule the follow-up appointment.  The format for your next appointment:   In Person  Provider:   You may see DR Cheriton or one of the following Advanced Practice Providers on your designated Care Team:    Luke Kilroy, PA-C  Callie Goodrich, PA-C  Jesse Cleaver, FNP    

## 2019-06-03 NOTE — Progress Notes (Signed)
Cardiology Office Note- virtual visit (phone)  This visit type was conducted due to national recommendations for restrictions regarding the COVID-19 Pandemic (e.g. social distancing) in an effort to limit this patient's exposure and mitigate transmission in our community.  Due to her co-morbid illnesses, this patient is at least at moderate risk for complications without adequate follow up.  This format is felt to be most appropriate for this patient at this time.  All issues noted in this document were discussed and addressed.  A limited physical exam was performed with this format.  Please refer to the patient's chart for her consent to telehealth for [facility]   Patient location: home Provider location: home  Date:  06/03/2019   ID:  Nahmir, Zeidman 1945-12-27, MRN [id number]  PCP:  Lorne Skeens, MD  Cardiologist:   Skeet Latch, MD   No chief complaint on file.    History of Present Illness: Anthony Vasquez is a 73 y.o. male with chronic systolic and diastolic heart failure (LVEF improved from 15% to 45%) , hypertension, hyperlipidemia, CAD s/p CABG who presents for follow up.  Mr. Oran was previously a patient of Dr. Mare Ferrari.  He was admitted 09/2014 with NSTEMI.  He had a LHC that demonstrated LVEF 15-20% and 3 vessel CAD. He underwent coronary artery bypass grafting with Dr. Prescott Gum ( (L-LAD, S-RI, S-OM).  Subsequent echo 10/2014 revealed improvement in his EF to 35-40%. He had a subsequent echo 01/2015 that revealed LVEF 45%. He was not started on an ACE inhibitor due to hypotension.    Since his last appointment Mr. Thumm has been doing well.  He continues to walk 2 miles daily.  This takes him about 35 minutes.  He does his exercises from cardiac rehab before and afterwards.  He sometimes has mild lightheadedness when he first stands but denies any syncope.  He has not experienced any chest pain and his breathing has been stable.  He denies lower extremity  edema, orthopnea, and PND.  He plans to move full-time to [place] next spring.  He does want to continue follow-up in [place].  He is scheduled to see his primary care provider next week.   Past Medical History:  Diagnosis Date  . Hypertension   . Ischemic cardiomyopathy    Echo 4/16: Mild LVH, EF 35-40%, anteroseptal, apical, inferior, apical anterior anteroapical akinesis, grade 1 diastolic dysfunction, mildly reduced RVSF  . Myocardial infarction [facility]) 09/2014   nstemi  . Renal disorder     Past Surgical History:  Procedure Laterality Date  . COLONOSCOPY  2015  . CORONARY ARTERY BYPASS GRAFT N/A 10/08/2014   Procedure: CORONARY ARTERY BYPASS GRAFTING (CABG);  Surgeon: Ivin Poot, MD;  Location: [place];  Service: Open Heart Surgery;  Laterality: N/A;  PATIENT IS GETTING AN IMPELLA IN THE CATH LAB CABG times 3 using left internal mammary artery and endoscopically harvested left saphenous vein.  Marland Kitchen LEFT HEART CATHETERIZATION WITH CORONARY ANGIOGRAM N/A 10/08/2014   Procedure: LEFT HEART CATHETERIZATION WITH CORONARY ANGIOGRAM;  Surgeon: Jettie Booze, MD;  Location: [facility] CATH LAB;  Service: Cardiovascular;  Laterality: N/A;  . TEE WITHOUT CARDIOVERSION N/A 10/08/2014   Procedure: TRANSESOPHAGEAL ECHOCARDIOGRAM (TEE);  Surgeon: Ivin Poot, MD;  Location: [place];  Service: Open Heart Surgery;  Laterality: N/A;  . TONSILLECTOMY       Current Outpatient Medications  Medication Sig Dispense Refill  . aspirin 81 MG tablet Take 81 mg by mouth  daily.    . metoprolol succinate (TOPROL-XL) 25 MG 24 hr tablet TAKE 1/2 TABLET BY MOUTH DAILY 45 tablet 0  . rosuvastatin (CRESTOR) 40 MG tablet TAKE 1 TABLET BY MOUTH DAILY 90 tablet 2  . Vitamin D, Ergocalciferol, (DRISDOL) 50000 UNITS CAPS capsule Take 50,000 Units by mouth every 14 (fourteen) days.     No current facility-administered medications for this visit.     Allergies:   Patient has no known allergies.    Social  History:  The patient  reports that he has never smoked. He has never used smokeless tobacco. He reports that he does not drink alcohol or use drugs.   Family History:  The patient's family history includes Healthy in his brother; Heart attack in his mother; Heart disease in his father and mother.    ROS:  Please see the history of present illness.   Otherwise, review of systems are positive for none.   All other systems are reviewed and negative.    PHYSICAL EXAM: VS:  BP 117/62   Pulse (!) 57   Ht '5\' 6"'$  (1.676 m)   Wt 155 lb (70.3 kg)   BMI 25.02 kg/m  , BMI Body mass index is 25.02 kg/m. GENERAL:  Sounds well LUNGS:  Respirations unlabored NEURO: Speech fluent PSYCH:  Cognitively intact, oriented to person place and time   EKG:  EKG is not ordered today. The ekg ordered 05/30/16 demonstrates sinus rhythm rate 68 bpm.  Prior anteroseptal infarct 05/22/17. Sinus bradycardia.  Rate 54 bpm.  Anteroseptal  infarct.  Lateral T wave flattening. 05/27/2018: Sinus bradycardia.  Rate 52 bpm.  LAFB.  Nonspecific T wave abnormalities.  Echo 02/10/2015: Study Conclusions  - Left ventricle: Septal apical and distal anterior wall   hypokinesis. The cavity size was mildly dilated. Wall thickness   was normal. The estimated ejection fraction was 45%. - Left atrium: The atrium was mildly dilated. - Atrial septum: There was increased thickness of the septum,   consistent with lipomatous hypertrophy. No defect or patent   foramen ovale was identified.  Recent Labs: No results found for requested labs within last 8760 hours.   11/20/16: Sodium 138, potassium 4.7, BUN 15, creatinine 1.22 AST 25, ALT 23 Total cholesterol 153, triglycerides 97, HDL 41, LDL 95  12/07/2017: Sodium 139, potassium 4.7, BUN 14, creatinine 1.16 AST 34, ALT 34 Total cholesterol 105, triglycerides 59, HDL 39, LDL 53  12/03/2018: Sodium 137, potassium 4.5, BUN 15, creatinine 1.45 AST 22, ALT 20 Total cholesterol  134, triglycerides 86, LDL 70, HDL 45   Lipid Panel    Component Value Date/Time   CHOL 143 10/08/2014 0528   TRIG 88 10/08/2014 0528   HDL 36 (L) 10/08/2014 0528   CHOLHDL 4.0 10/08/2014 0528   VLDL 18 10/08/2014 0528   LDLCALC 89 10/08/2014 0528    Wt Readings from Last 3 Encounters:  06/03/19 155 lb (70.3 kg)  05/27/18 156 lb 3.2 oz (70.9 kg)  05/22/17 157 lb 3.2 oz (71.3 kg)      ASSESSMENT AND PLAN:  # CAD s/p CABG:  # Hyperlipidemia:  Mr. Tetrick is doing quite well.  He is exercising daily and has no exertional symptoms.  LDL is slightly higher but still at his goal of 70 or less.  Continue rosuvastatin, aspirin, and metoprolol.  # Chronic systolic and diastolic heart failure:  LVEF 45%.  Continue metoprolol succinate.  He has no signs of heart failure.  He has not been on  ACE inhibitor or ARB due to low blood pressure.  He is euvolemic and doing well.   # Hypertension: Blood pressure is well-controlled.  Continue metoprolol  # CKD III: eGFR reduced to 48 when checked last spring.  He is not on any nephrotoxic agents.  He is due for labs with his PCP next week.   Current medicines are reviewed at length with the patient today.  The patient does not have concerns regarding medicines.  The following changes have been made:  no change  Labs/ tests ordered today include:   No orders of the defined types were placed in this encounter.    Disposition:   FU with [place] C. Oval Linsey, MD, [facility] in 1 year.       Signed, Kandise Riehle C. Oval Linsey, MD, [facility]  06/03/2019 8:11 AM    [place]

## 2019-08-11 ENCOUNTER — Other Ambulatory Visit: Payer: Self-pay | Admitting: Cardiovascular Disease

## 2019-08-11 NOTE — Telephone Encounter (Signed)
*  STAT* If patient is at the pharmacy, call can be transferred to refill team.   1. Which medications need to be refilled? (please list name of each medication and dose if known)   metoprolol succinate (TOPROL-XL) 25 MG 24 hr tablet  rosuvastatin (CRESTOR) 40 MG tablet  2. Which pharmacy/location (including street and city if local pharmacy) is medication to be sent to? [facility] - [address] [place] S[place], [place] [address]  3. Do they need a 30 day or 90 day supply?

## 2019-08-12 MED ORDER — METOPROLOL SUCCINATE ER 25 MG PO TB24: 12.5000 mg | ORAL_TABLET | Freq: Every day | ORAL | 3 refills | Status: DC

## 2019-08-12 MED ORDER — ROSUVASTATIN CALCIUM 40 MG PO TABS: 40.0000 mg | ORAL_TABLET | Freq: Every day | ORAL | 3 refills | Status: DC

## 2019-08-14 ENCOUNTER — Telehealth: Payer: Self-pay | Admitting: Cardiovascular Disease

## 2019-08-14 NOTE — Telephone Encounter (Signed)
*  STAT* If patient is at the pharmacy, call can be transferred to refill team.   1. Which medications need to be refilled? (please list name of each medication and dose if known)  rosuvastatin (CRESTOR) 40 MG tablet metoprolol succinate (TOPROL-XL) 25 MG 24 hr tablet  2. Which pharmacy/location (including street and city if local pharmacy) is medication to be sent to? [facility] -  [address] [place], [place] [address]  3. Do they need a 30 day or 90 day supply? 90 day supply

## 2019-08-15 MED ORDER — ROSUVASTATIN CALCIUM 40 MG PO TABS: 40.0000 mg | ORAL_TABLET | Freq: Every day | ORAL | 3 refills | Status: DC

## 2019-08-15 MED ORDER — METOPROLOL SUCCINATE ER 25 MG PO TB24: 12.5000 mg | ORAL_TABLET | Freq: Every day | ORAL | 3 refills | Status: DC

## 2019-08-15 NOTE — Telephone Encounter (Signed)
Pt's medications were sent to pt's pharmacy as requested. Confirmation received.  

## 2019-08-15 NOTE — Telephone Encounter (Signed)
*  STAT* If patient is at the pharmacy, call can be transferred to refill team.   1. Which medications need to be refilled? (please list name of each medication and dose if known)  Pt need new prescriptions- he moved out of town- Rosuvastatin and Metoprolol  2. Which pharmacy/location (including street and city if local pharmacy) is medication to be sent to? [address] [facility] [place], [place]  3. Do they need a 30 day or 90 day supply? 90 days and refills

## 2020-06-01 ENCOUNTER — Ambulatory Visit (INDEPENDENT_AMBULATORY_CARE_PROVIDER_SITE_OTHER): Payer: Medicare Other | Admitting: Cardiovascular Disease

## 2020-06-01 ENCOUNTER — Encounter: Payer: Self-pay | Admitting: Cardiovascular Disease

## 2020-06-01 VITALS — BP 127/69 | HR 74 | Temp 97.3°F | Ht 66.0 in | Wt 157.2 lb

## 2020-06-01 DIAGNOSIS — E785 Hyperlipidemia, unspecified: Secondary | ICD-10-CM | POA: Diagnosis not present

## 2020-06-01 DIAGNOSIS — I10 Essential (primary) hypertension: Secondary | ICD-10-CM

## 2020-06-01 DIAGNOSIS — I5042 Chronic combined systolic (congestive) and diastolic (congestive) heart failure: Secondary | ICD-10-CM

## 2020-06-01 DIAGNOSIS — Z951 Presence of aortocoronary bypass graft: Secondary | ICD-10-CM

## 2020-06-01 NOTE — Patient Instructions (Signed)
Medication Instructions:  Your physician recommends that you continue on your current medications as directed. Please refer to the Current Medication list given to you today.  *If you need a refill on your cardiac medications before your next appointment, please call your pharmacy*  Lab Work: NONE  Testing/Procedures: NONE  Follow-Up: At [facility], you and your health needs are our priority.  As part of our continuing mission to provide you with exceptional heart care, we have created designated Provider Care Teams.  These Care Teams include your primary Cardiologist (physician) and Advanced Practice Providers (APPs -  Physician Assistants and Nurse Practitioners) who all work together to provide you with the care you need, when you need it.  We recommend signing up for the patient portal called "MyChart".  Sign up information is provided on this After Visit Summary.  MyChart is used to connect with patients for Virtual Visits (Telemedicine).  Patients are able to view lab/test results, encounter notes, upcoming appointments, etc.  Non-urgent messages can be sent to your provider as well.   To learn more about what you can do with MyChart, go to [email or web address].    Your next appointment:   12 month(s)  You will receive a reminder letter in the mail two months in advance. If you don't receive a letter, please call our office to schedule the follow-up appointment.  The format for your next appointment:   In Person  Provider:   You may see DR Menard or one of the following Advanced Practice Providers on your designated Care Team:    Luke Kilroy, PA-C  Callie Goodrich, PA-C  Jesse Cleaver, FNP     

## 2020-06-01 NOTE — Progress Notes (Signed)
Cardiology Office Note  Date:  06/01/2020   ID:  Anthony, Vasquez 11/17/1945, MRN [id number]  PCP:  Lorne Skeens, MD  Cardiologist:   Skeet Latch, MD   No chief complaint on file.    History of Present Illness: Anthony Vasquez is a 74 y.o. male with chronic systolic and diastolic heart failure (LVEF improved from 15% to 45%) , hypertension, hyperlipidemia, CAD s/p CABG who presents for follow up.  Anthony Vasquez was previously a patient of Dr. Mare Ferrari.  He was admitted 09/2014 with NSTEMI.  He had a LHC that demonstrated LVEF 15-20% and 3 vessel CAD. He underwent coronary artery bypass grafting with Dr. Prescott Gum ( (L-LAD, S-RI, S-OM).  Subsequent echo 10/2014 revealed improvement in his EF to 35-40%. He had a subsequent echo 01/2015 that revealed LVEF 45%. He was not started on an ACE inhibitor due to hypotension.    Since his last appointment Anthony Vasquez has been doing well.  He walks for 2 miles 5 days/week.  He also walks on the beach 1 day/week.  He has no exertional chest pain or shortness of breath.  He denies any lower extremity edema, orthopnea, or PND.  His diet has been good and he tries to limit red meat and fried foods.  He checks his blood pressure twice daily.  It is in the 120s over 70s before exercising and decreases to the 100s over 70s after exercising.  He has no lightheadedness or dizziness.  He denies any palpitations.  He is scheduled to get labs with his PCP later this week.  Since his last appointment he moved to [facility], [place] [facility].   Past Medical History:  Diagnosis Date  . Chronic combined systolic and diastolic heart failure ([place])   . Hypertension   . Ischemic cardiomyopathy    Echo 4/16: Mild LVH, EF 35-40%, anteroseptal, apical, inferior, apical anterior anteroapical akinesis, grade 1 diastolic dysfunction, mildly reduced RVSF  . Myocardial infarction [facility]) 09/2014   nstemi  . Renal disorder     Past Surgical History:  Procedure Laterality  Date  . COLONOSCOPY  2015  . CORONARY ARTERY BYPASS GRAFT N/A 10/08/2014   Procedure: CORONARY ARTERY BYPASS GRAFTING (CABG);  Surgeon: Ivin Poot, MD;  Location: [facility];  Service: Open Heart Surgery;  Laterality: N/A;  PATIENT IS GETTING AN IMPELLA IN THE CATH Vasquez CABG times 3 using left internal mammary artery and endoscopically harvested left saphenous vein.  Marland Kitchen LEFT HEART CATHETERIZATION WITH CORONARY ANGIOGRAM N/A 10/08/2014   Procedure: LEFT HEART CATHETERIZATION WITH CORONARY ANGIOGRAM;  Surgeon: Jettie Booze, MD;  Location: [facility];  Service: Cardiovascular;  Laterality: N/A;  . TEE WITHOUT CARDIOVERSION N/A 10/08/2014   Procedure: TRANSESOPHAGEAL ECHOCARDIOGRAM (TEE);  Surgeon: Ivin Poot, MD;  Location: [facility];  Service: Open Heart Surgery;  Laterality: N/A;  . TONSILLECTOMY       Current Outpatient Medications  Medication Sig Dispense Refill  . aspirin 81 MG tablet Take 81 mg by mouth daily.    . metoprolol succinate (TOPROL-XL) 25 MG 24 hr tablet Take 0.5 tablets (12.5 mg total) by mouth daily. 45 tablet 3  . rosuvastatin (CRESTOR) 40 MG tablet Take 1 tablet (40 mg total) by mouth daily. 90 tablet 3  . Vitamin D, Ergocalciferol, (DRISDOL) 50000 UNITS CAPS capsule Take 50,000 Units by mouth every 14 (fourteen) days.     No current facility-administered medications for this visit.    Allergies:   Patient  has no known allergies.    Social History:  The patient  reports that he has never smoked. He has never used smokeless tobacco. He reports that he does not drink alcohol and does not use drugs.   Family History:  The patient's family history includes Healthy in his brother; Heart attack in his mother; Heart disease in his father and mother.    ROS:  Please see the history of present illness.   Otherwise, review of systems are positive for none.   All other systems are reviewed and negative.    PHYSICAL EXAM: VS:  BP 127/69   Pulse 74   Temp (!) 97.3 F  (36.3 C)   Ht $R'5\' 6"'OM$  (1.676 m)   Wt 157 lb 3.2 oz (71.3 kg)   SpO2 96%   BMI 25.37 kg/m  , BMI Body mass index is 25.37 kg/m. GENERAL:  Well appearing HEENT: Pupils equal round and reactive, fundi not visualized, oral mucosa unremarkable NECK:  No jugular venous distention, waveform within normal limits, carotid upstroke brisk and symmetric, no bruits LUNGS:  Clear to auscultation bilaterally HEART:  RRR.  PMI not displaced or sustained,S1 and S2 within normal limits, no S3, no S4, no clicks, no rubs, no murmurs ABD:  Flat, positive bowel sounds normal in frequency in pitch, no bruits, no rebound, no guarding, no midline pulsatile mass, no hepatomegaly, no splenomegaly EXT:  2 plus pulses throughout, no edema, no cyanosis no clubbing SKIN:  No rashes no nodules NEURO:  Cranial nerves II through XII grossly intact, motor grossly intact throughout PSYCH:  Cognitively intact, oriented to person place and time   EKG:  EKG is ordered today. The ekg ordered 05/30/16 demonstrates sinus rhythm rate 68 bpm.  Prior anteroseptal infarct 05/22/17. Sinus bradycardia.  Rate 54 bpm.  Anteroseptal  infarct.  Lateral T wave flattening. 05/27/2018: Sinus bradycardia.  Rate 52 bpm.  LAFB.  Nonspecific T wave abnormalities. 06/01/20: Sinus rhythm.  Rate 74 bpm.  PVCs.  Prior septal infarct.  Echo 02/10/2015: Study Conclusions  - Left ventricle: Septal apical and distal anterior wall   hypokinesis. The cavity size was mildly dilated. Wall thickness   was normal. The estimated ejection fraction was 45%. - Left atrium: The atrium was mildly dilated. - Atrial septum: There was increased thickness of the septum,   consistent with lipomatous hypertrophy. No defect or patent   foramen ovale was identified.  Recent Labs: No results found for requested labs within last 8760 hours.   11/20/16: Sodium 138, potassium 4.7, BUN 15, creatinine 1.22 AST 25, ALT 23 Total cholesterol 153, triglycerides 97, HDL 41,  LDL 95  12/07/2017: Sodium 139, potassium 4.7, BUN 14, creatinine 1.16 AST 34, ALT 34 Total cholesterol 105, triglycerides 59, HDL 39, LDL 53  12/03/2018: Sodium 137, potassium 4.5, BUN 15, creatinine 1.45 AST 22, ALT 20 Total cholesterol 134, triglycerides 86, LDL 70, HDL 45   Lipid Panel    Component Value Date/Time   CHOL 143 10/08/2014 0528   TRIG 88 10/08/2014 0528   HDL 36 (L) 10/08/2014 0528   CHOLHDL 4.0 10/08/2014 0528   VLDL 18 10/08/2014 0528   LDLCALC 89 10/08/2014 0528    Wt Readings from Last 3 Encounters:  06/01/20 157 lb 3.2 oz (71.3 kg)  06/03/19 155 lb (70.3 kg)  05/27/18 156 lb 3.2 oz (70.9 kg)      ASSESSMENT AND PLAN:  # CAD s/p CABG:  # Hyperlipidemia:  Mr. Pickelsimer continues to do well.  He is exercising almost every day and has no exertional symptoms.  He is euvolemic on exam.  Continue aspirin, metoprolol, and rosuvastatin.  He is scheduled to have lipids checked with his PCP later this week.  LDL goal is less than 70.  # Chronic systolic and diastolic heart failure:  LVEF improved to 45%.  He is euvolemic on exam.  Continue metoprolol succinate.  He has no signs of heart failure.  He has not been on ACE inhibitor or ARB due to low blood pressure.  He continues to limit his sodium intake.  # Hypertension: Blood pressure is well-controlled.  Continue metoprolol.  # CKD III: eGFR reduced to 48 when checked last spring.  He is not on any nephrotoxic agents.  He is due for labs with his PCP next week.   Current medicines are reviewed at length with the patient today.  The patient does not have concerns regarding medicines.  The following changes have been made:  no change  Labs/ tests ordered today include:   Orders Placed This Encounter  Procedures  . EKG 12-Lead     Disposition:   FU with Mihira Tozzi C. Oval Linsey, MD, [facility] in 1 year.  He is trying to decide whether he will follow-up in [place] or come back in a  year.     Signed, Dorothie Wah C. Oval Linsey, MD, [facility]  06/01/2020 1:14 PM    [facility]

## 2020-08-03 ENCOUNTER — Telehealth: Payer: Self-pay | Admitting: Cardiovascular Disease

## 2020-08-03 MED ORDER — METOPROLOL SUCCINATE ER 25 MG PO TB24: 12.5000 mg | ORAL_TABLET | Freq: Every day | ORAL | 3 refills | Status: DC

## 2020-08-03 NOTE — Telephone Encounter (Signed)
*  STAT* If patient is at the pharmacy, call can be transferred to refill team.   1. Which medications need to be refilled? (please list name of each medication and dose if known)  rosuvastatin (CRESTOR) 40 MG tablet; metoprolol succinate (TOPROL-XL) 25 MG 24 hr tablet  2. Which pharmacy/location (including street and city if local pharmacy) is medication to be sent to? [facility] [place] [place] - [address]  3. Do they need a 30 day or 90 day supply? 90

## 2020-08-05 ENCOUNTER — Encounter: Payer: Self-pay | Admitting: Cardiovascular Disease

## 2020-08-05 MED ORDER — ROSUVASTATIN CALCIUM 40 MG PO TABS: 40.0000 mg | ORAL_TABLET | Freq: Every day | ORAL | 3 refills | Status: DC

## 2020-08-05 MED ORDER — METOPROLOL SUCCINATE ER 25 MG PO TB24: 12.5000 mg | ORAL_TABLET | Freq: Every day | ORAL | 3 refills | Status: DC

## 2020-08-05 NOTE — Addendum Note (Signed)
Addended by: Lebron Conners, Maralyn Sago E on: 08/05/2020 02:52 PM   Modules accepted: Orders

## 2020-08-05 NOTE — Telephone Encounter (Signed)
error 

## 2020-08-05 NOTE — Telephone Encounter (Signed)
Left detailed message per DPR stating that medications were refilled to pharmacy in [facility] as requested. Pt advised to call back if he had any further difficulties with the prescriptions.

## 2020-08-05 NOTE — Telephone Encounter (Signed)
Patient states refill has still not been sent in, please advise.

## 2021-06-06 ENCOUNTER — Ambulatory Visit (INDEPENDENT_AMBULATORY_CARE_PROVIDER_SITE_OTHER): Payer: Medicare Other | Admitting: Cardiovascular Disease

## 2021-06-06 ENCOUNTER — Encounter (HOSPITAL_BASED_OUTPATIENT_CLINIC_OR_DEPARTMENT_OTHER): Payer: Self-pay | Admitting: Cardiovascular Disease

## 2021-06-06 ENCOUNTER — Ambulatory Visit (HOSPITAL_BASED_OUTPATIENT_CLINIC_OR_DEPARTMENT_OTHER): Payer: Medicare Other | Admitting: Cardiovascular Disease

## 2021-06-06 ENCOUNTER — Other Ambulatory Visit: Payer: Self-pay

## 2021-06-06 ENCOUNTER — Telehealth (HOSPITAL_BASED_OUTPATIENT_CLINIC_OR_DEPARTMENT_OTHER): Payer: Self-pay | Admitting: *Deleted

## 2021-06-06 VITALS — BP 142/68 | HR 70 | Ht 66.0 in | Wt 161.4 lb

## 2021-06-06 DIAGNOSIS — I5042 Chronic combined systolic (congestive) and diastolic (congestive) heart failure: Secondary | ICD-10-CM | POA: Diagnosis not present

## 2021-06-06 NOTE — Progress Notes (Signed)
Cardiology Office Note  Date:  06/06/2021   ID:  [name], [name] 08/27/1945, MRN [id number]  PCP:  Carolynn Sayers  Cardiologist:   Chilton Si, MD   No chief complaint on file.    History of Present Illness: Anthony Vasquez is a 75 y.o. male with chronic systolic and diastolic heart failure (LVEF improved from 15% to 45%) , hypertension, hyperlipidemia, CAD s/p CABG who presents for follow up.  Anthony Vasquez was previously a patient of Anthony Vasquez.  He was admitted 09/2014 with NSTEMI.  He had a LHC that demonstrated LVEF 15-20% and 3 vessel CAD. He underwent coronary artery bypass grafting with Dr. Donata Clay ( (L-LAD, S-RI, S-OM).  Subsequent echo 10/2014 revealed improvement in his EF to 35-40%. He had a subsequent echo 01/2015 that revealed LVEF 45%. He was not started on an ACE inhibitor due to hypotension.  At his last appointment he moved to [facility], [place].    He continues to feel well.  He has been walking 5 days/week for 2 miles at a time.  This takes him about 34 minutes.  He has no exertional chest pain or shortness of breath.  He denies palpitations, lightheadedness, or dizziness.  He has not noted any lower extremity edema, orthopnea, or PND.  He checks his blood pressures at home every day before and after his walk.  His blood pressure is usually in the 110s to low 130s.  He notes that he saw his PCP recently and had lipids checked at that time.   Past Medical History:  Diagnosis Date   Chronic combined systolic and diastolic heart failure (HCC)    Hypertension    Ischemic cardiomyopathy    Echo 4/16: Mild LVH, EF 35-40%, anteroseptal, apical, inferior, apical anterior anteroapical akinesis, grade 1 diastolic dysfunction, mildly reduced RVSF   Myocardial infarction (HCC) 09/2014   nstemi   Renal disorder     Past Surgical History:  Procedure Laterality Date   COLONOSCOPY  2015   CORONARY ARTERY BYPASS GRAFT N/A 10/08/2014   Procedure: CORONARY ARTERY BYPASS  GRAFTING (CABG);  Surgeon: Kerin Perna, MD;  Location: [facility] OR;  Service: Open Heart Surgery;  Laterality: N/A;  PATIENT IS GETTING AN IMPELLA IN THE CATH LAB CABG times 3 using left internal mammary artery and endoscopically harvested left saphenous vein.   LEFT HEART CATHETERIZATION WITH CORONARY ANGIOGRAM N/A 10/08/2014   Procedure: LEFT HEART CATHETERIZATION WITH CORONARY ANGIOGRAM;  Surgeon: Corky Crafts, MD;  Location: [facility] CATH LAB;  Service: Cardiovascular;  Laterality: N/A;   TEE WITHOUT CARDIOVERSION N/A 10/08/2014   Procedure: TRANSESOPHAGEAL ECHOCARDIOGRAM (TEE);  Surgeon: Kerin Perna, MD;  Location: [facility] OR;  Service: Open Heart Surgery;  Laterality: N/A;   TONSILLECTOMY       Current Outpatient Medications  Medication Sig Dispense Refill   aspirin 81 MG tablet Take 81 mg by mouth daily.     metoprolol succinate (TOPROL-XL) 25 MG 24 hr tablet Take 0.5 tablets (12.5 mg total) by mouth daily. 45 tablet 3   rosuvastatin (CRESTOR) 40 MG tablet Take 1 tablet (40 mg total) by mouth daily. 90 tablet 3   Vitamin D, Ergocalciferol, (DRISDOL) 50000 UNITS CAPS capsule Take 50,000 Units by mouth every 30 (thirty) days.     No current facility-administered medications for this visit.    Allergies:   Patient has no known allergies.    Social History:  The patient  reports that he has never smoked. He  has never used smokeless tobacco. He reports that he does not drink alcohol and does not use drugs.   Family History:  The patient's family history includes Healthy in his brother; Heart attack in his mother; Heart disease in his father and mother.    ROS:  Please see the history of present illness.   Otherwise, review of systems are positive for none.   All other systems are reviewed and negative.    PHYSICAL EXAM: VS:  BP (!) 142/68   Pulse 70   Ht $R'5\' 6"'CP$  (1.676 m)   Wt 161 lb 6.4 oz (73.2 kg)   BMI 26.05 kg/m  , BMI Body mass index is 26.05 kg/m. GENERAL:  Well  appearing HEENT: Pupils equal round and reactive, fundi not visualized, oral mucosa unremarkable NECK:  No jugular venous distention, waveform within normal limits, carotid upstroke brisk and symmetric, no bruits LUNGS:  Clear to auscultation bilaterally HEART:  RRR.  PMI not displaced or sustained,S1 and S2 within normal limits, no S3, no S4, no clicks, no rubs, no murmurs ABD:  Flat, positive bowel sounds normal in frequency in pitch, no bruits, no rebound, no guarding, no midline pulsatile mass, no hepatomegaly, no splenomegaly EXT:  2 plus pulses throughout, no edema, no cyanosis no clubbing SKIN:  No rashes no nodules NEURO:  Cranial nerves II through XII grossly intact, motor grossly intact throughout PSYCH:  Cognitively intact, oriented to person place and time   EKG:  EKG is ordered today. The ekg ordered 05/30/16 demonstrates sinus rhythm rate 68 bpm.  Prior anteroseptal infarct 05/22/17. Sinus bradycardia.  Rate 54 bpm.  Anteroseptal  infarct.  Lateral T wave flattening. 05/27/2018: Sinus bradycardia.  Rate 52 bpm.  LAFB.  Nonspecific T wave abnormalities. 06/01/20: Sinus rhythm.  Rate 74 bpm.  PVCs.  Prior septal infarct. 06/06/21: Sinus rhythm.  Rate 70 bpm.  Frequent PVCs.    Echo 02/10/2015: Study Conclusions   - Left ventricle: Septal apical and distal anterior wall   hypokinesis. The cavity size was mildly dilated. Wall thickness   was normal. The estimated ejection fraction was 45%. - Left atrium: The atrium was mildly dilated. - Atrial septum: There was increased thickness of the septum,   consistent with lipomatous hypertrophy. No defect or patent   foramen ovale was identified.  Recent Labs: No results found for requested labs within last 8760 hours.   11/20/16: Sodium 138, potassium 4.7, BUN 15, creatinine 1.22 AST 25, ALT 23 Total cholesterol 153, triglycerides 97, HDL 41, LDL 95  12/07/2017: Sodium 139, potassium 4.7, BUN 14, creatinine 1.16 AST 34, ALT  34 Total cholesterol 105, triglycerides 59, HDL 39, LDL 53  12/03/2018: Sodium 137, potassium 4.5, BUN 15, creatinine 1.45 AST 22, ALT 20 Total cholesterol 134, triglycerides 86, LDL 70, HDL 45   Lipid Panel    Component Value Date/Time   CHOL 143 10/08/2014 0528   TRIG 88 10/08/2014 0528   HDL 36 (L) 10/08/2014 0528   CHOLHDL 4.0 10/08/2014 0528   VLDL 18 10/08/2014 0528   LDLCALC 89 10/08/2014 0528    Wt Readings from Last 3 Encounters:  06/06/21 161 lb 6.4 oz (73.2 kg)  06/01/20 157 lb 3.2 oz (71.3 kg)  06/03/19 155 lb (70.3 kg)      ASSESSMENT AND PLAN:  # CAD s/p CABG:  # Hyperlipidemia:  Mr. Limb continues to do well.  He is exercising almost every day and has no exertional symptoms.  He is euvolemic on exam.  Continue aspirin, metoprolol, and rosuvastatin.  He had lipids checked with his PCP recently.  We will get a copy.  Continue aspirin, metoprolol, and rosuvastatin.  # Chronic systolic and diastolic heart failure:  # PVCs:  Asymptomatic PVCs noted on EKG.  LVEF improved to 45%.  He is euvolemic on exam.  He is euvolemic and doing well.  He has not had an echo in 6 years.  We will repeat an echo to assess her systolic function.  He was previously on an ACE inhibitor but developed hypotension.  Continue metoprolol for now.  # Hypertension: Blood pressure above goal in the office but he reports it has been well-controlled at home.  He has been using a wrist cuff.  He is going to get an arm cuff and track his blood pressures.  We will discuss at virtual follow-up in a couple of months.  # CKD III: eGFR reduced to 48 when checked last spring.  We will reassess on labs from his PCP.   Current medicines are reviewed at length with the patient today.  The patient does not have concerns regarding medicines.  The following changes have been made:  no change  Labs/ tests ordered today include:   No orders of the defined types were placed in this  encounter.    Disposition:   FU with Shia Delaine C. Oval Linsey, MD, [facility] in 1 year in person, 2 months virtual.     Signed, Ava. Oval Linsey, MD, [facility]  06/06/2021 1:32 PM    [facility]

## 2021-06-06 NOTE — Telephone Encounter (Signed)
Called PCP to get copy of last lipid per request of Dr Duke Salvia, patient seen in office  Had to leave message to call back

## 2021-06-06 NOTE — Patient Instructions (Addendum)
Medication Instructions:  Your physician recommends that you continue on your current medications as directed. Please refer to the Current Medication list given to you today.  *If you need a refill on your cardiac medications before your next appointment, please call your pharmacy*  Lab Work: LEFT MESSAGE FOR YOUR PRIMARY CARE OFFICE TO CALL BACK SO COULD GET A COPY OF YOU LABS   If you have labs (blood work) drawn today and your tests are completely normal, you will receive your results only by: MyChart Message (if you have MyChart) OR A paper copy in the mail If you have any lab test that is abnormal or we need to change your treatment, we will call you to review the results.  Testing/Procedures: Your physician has requested that you have an echocardiogram. Echocardiography is a painless test that uses sound waves to create images of your heart. It provides your doctor with information about the size and shape of your heart and how well your heart's chambers and valves are working. This procedure takes approximately one hour. There are no restrictions for this procedure.   Follow-Up: At [facility], you and your health needs are our priority.  As part of our continuing mission to provide you with exceptional heart care, we have created designated Provider Care Teams.  These Care Teams include your primary Cardiologist (physician) and Advanced Practice Providers (APPs -  Physician Assistants and Nurse Practitioners) who all work together to provide you with the care you need, when you need it.  We recommend signing up for the patient portal called "MyChart".  Sign up information is provided on this After Visit Summary.  MyChart is used to connect with patients for Virtual Visits (Telemedicine).  Patients are able to view lab/test results, encounter notes, upcoming appointments, etc.  Non-urgent messages can be sent to your provider as well.   To learn more about what you can do with MyChart,  go to [email or web address].    Your next appointment:   12 month(s)  The format for your next appointment:   In Person  Provider:   Chilton Si, MD  Your physician recommends that you schedule a follow-up appointment in: virtual visit 2-3 months    Other Instructions  MONITOR AND LOG YOUR BLOOD PRESSURE AT HOME, HAVE READINGS AVAILABLE AT YOUR FOLLOW UP

## 2021-06-06 NOTE — Telephone Encounter (Signed)
Spoke with PCP and they will fax lipid panel

## 2021-07-13 ENCOUNTER — Encounter (HOSPITAL_BASED_OUTPATIENT_CLINIC_OR_DEPARTMENT_OTHER): Payer: Self-pay

## 2021-08-01 ENCOUNTER — Other Ambulatory Visit: Payer: Self-pay

## 2021-08-01 ENCOUNTER — Ambulatory Visit (INDEPENDENT_AMBULATORY_CARE_PROVIDER_SITE_OTHER): Payer: Medicare Other

## 2021-08-01 DIAGNOSIS — I5042 Chronic combined systolic (congestive) and diastolic (congestive) heart failure: Secondary | ICD-10-CM | POA: Diagnosis not present

## 2021-08-01 LAB — ECHOCARDIOGRAM COMPLETE
AR max vel: 2.25 cm2
AV Area VTI: 2.28 cm2
AV Area mean vel: 1.94 cm2
AV Mean grad: 5 mmHg
AV Peak grad: 11 mmHg
AV Vena cont: 0.24 cm
Ao pk vel: 1.66 m/s
Area-P 1/2: 3.05 cm2
Calc EF: 54.3 %
S' Lateral: 4.03 cm
Single Plane A2C EF: 53.2 %
Single Plane A4C EF: 55 %

## 2021-08-03 ENCOUNTER — Telehealth (HOSPITAL_BASED_OUTPATIENT_CLINIC_OR_DEPARTMENT_OTHER): Payer: Self-pay | Admitting: Cardiovascular Disease

## 2021-08-03 MED ORDER — METOPROLOL SUCCINATE ER 25 MG PO TB24: 12.5000 mg | ORAL_TABLET | Freq: Every day | ORAL | 3 refills | Status: AC

## 2021-08-03 MED ORDER — ROSUVASTATIN CALCIUM 40 MG PO TABS: 40.0000 mg | ORAL_TABLET | Freq: Every day | ORAL | 3 refills | Status: AC

## 2021-08-03 NOTE — Telephone Encounter (Signed)
Received fax from [facility] requesting refills for Rosuvastatin and Metoprolol Succ. Rx request sent to pharmacy.

## 2021-08-31 NOTE — Progress Notes (Signed)
Virtual Visit via Video Note   This visit type was conducted due to national recommendations for restrictions regarding the COVID-19 Pandemic (e.g. social distancing) in an effort to limit this patient's exposure and mitigate transmission in our community.  Due to his co-morbid illnesses, this patient is at least at moderate risk for complications without adequate follow up.  This format is felt to be most appropriate for this patient at this time.  All issues noted in this document were discussed and addressed.  A limited physical exam was performed with this format.  Please refer to the patient's chart for his consent to telehealth for [facility].  The patient was identified using 2 identifiers.  Date:  09/01/2021   ID:  Anthony Vasquez, DOB 06-Jan-1946, MRN [id number]  Patient Location: Home Provider Location: Office/Clinic  Visit type:  Video  PCP:  Comianos, Pennie Banter, DO  Cardiologist:  None  Electrophysiologist:  None   Evaluation Performed:  Follow-Up Visit  History of Present Illness:   Anthony Vasquez is a 76 y.o. male with chronic systolic and diastolic heart failure (LVEF improved from 15% to 45%) , hypertension, hyperlipidemia, tobacco abuse, and CAD s/p CABG who presents for follow up.  Anthony Vasquez was previously a patient of Anthony Vasquez.  He was admitted 09/2014 with NSTEMI.  He had a LHC that demonstrated LVEF 15-20% and 3 vessel CAD. He underwent coronary artery bypass grafting with Dr. Donata Vasquez ( (L-LAD, S-RI, S-OM).  Subsequent echo 10/2014 revealed improvement in his EF to 35-40%. He had a subsequent echo 01/2015 that revealed LVEF 45%. He was not started on an ACE inhibitor due to hypotension.  He moved to [place], [place].    At his last visit, he was doing well and staying active. Echo 07/2021 revealed LVEF 50-55%, mild LVH, and aortic dilation to 36 mm.  Today, he is doing well with no major concerns. He records his blood pressure at home and reports measurements ranging  from 120s to 130s systolic. His most recent measurement is 168/98. However, he did have a few cups of coffee. He also bought a new machine and wonders if it not calibrated. He denies taking over-the-counter medications or prednisone recently. For exercise, he walks 2 miles daily on a treadmill at 2.8 speed and 1 degree incline. The 2 miles takes him 31 minutes to complete. He denies any palpitations, chest pain, or shortness of breath, lightheadedness, headaches, syncope, orthopnea, PND, lower extremity edema or exertional symptoms.  Past Medical History:  Diagnosis Date   Chronic combined systolic and diastolic heart failure (HCC)    Hypertension    Ischemic cardiomyopathy    Echo 4/16: Mild LVH, EF 35-40%, anteroseptal, apical, inferior, apical anterior anteroapical akinesis, grade 1 diastolic dysfunction, mildly reduced RVSF   Myocardial infarction (HCC) 09/2014   nstemi   Renal disorder     Past Surgical History:  Procedure Laterality Date   COLONOSCOPY  2015   CORONARY ARTERY BYPASS GRAFT N/A 10/08/2014   Procedure: CORONARY ARTERY BYPASS GRAFTING (CABG);  Surgeon: Anthony Perna, MD;  Location: [facility] OR;  Service: Open Heart Surgery;  Laterality: N/A;  PATIENT IS GETTING AN IMPELLA IN THE CATH LAB CABG times 3 using left internal mammary artery and endoscopically harvested left saphenous vein.   LEFT HEART CATHETERIZATION WITH CORONARY ANGIOGRAM N/A 10/08/2014   Procedure: LEFT HEART CATHETERIZATION WITH CORONARY ANGIOGRAM;  Surgeon: Anthony Crafts, MD;  Location: [facility] CATH LAB;  Service: Cardiovascular;  Laterality: N/A;   TEE WITHOUT CARDIOVERSION N/A 10/08/2014   Procedure: TRANSESOPHAGEAL ECHOCARDIOGRAM (TEE);  Surgeon: Anthony Perna, MD;  Location: [facility] OR;  Service: Open Heart Surgery;  Laterality: N/A;   TONSILLECTOMY       Current Outpatient Medications  Medication Sig Dispense Refill   aspirin 81 MG tablet Take 81 mg by mouth daily.     metoprolol succinate (TOPROL-XL) 25 MG  24 hr tablet Take 0.5 tablets (12.5 mg total) by mouth daily. 45 tablet 3   rosuvastatin (CRESTOR) 40 MG tablet Take 1 tablet (40 mg total) by mouth daily. 90 tablet 3   Vitamin D, Ergocalciferol, (DRISDOL) 50000 UNITS CAPS capsule Take 50,000 Units by mouth every 30 (thirty) days.     No current facility-administered medications for this visit.    Allergies:   Patient has no known allergies.    Social History:  The patient  reports that he has never smoked. He has never used smokeless tobacco. He reports that he does not drink alcohol and does not use drugs.   Family History:  The patient's family history includes Healthy in his brother; Heart attack in his mother; Heart disease in his father and mother.    ROS:  Please see the history of present illness. All other systems are reviewed and negative.   PHYSICAL EXAM: GENERAL: Well-appearing.  No acute distress. HEENT: Pupils equal round.  Oral mucosa unremarkable NECK:  No jugular venous distention, no visible thyromegaly EXT:  No edema, no cyanosis no clubbing SKIN:  No rashes no nodules NEURO:  Speech fluent.  Cranial nerves grossly intact.  Moves all 4 extremities freely PSYCH:  Cognitively intact, oriented to person place and time  EKG:  EKG was not ordered today 06/06/21: Sinus rhythm.  Rate 70 bpm.  Frequent PVCs.  06/01/20: Sinus rhythm.  Rate 74 bpm.  PVCs.  Prior septal infarct. 05/27/2018: Sinus bradycardia.  Rate 52 bpm. LAFB. Nonspecific T wave abnormalities. 05/22/17. Sinus bradycardia.  Rate 54 bpm. Anteroseptal  infarct.  Lateral T wave flattening. 05/30/16: sinus rhythm rate 68 bpm.  Prior anteroseptal infarct  Echo 08/01/21: 1. Left ventricular ejection fraction, by estimation, is 50 to 55%. The  left ventricle has low normal function. The left ventricle demonstrates  regional wall motion abnormalities (see scoring diagram/findings for  description). There is hypokinesis of the mid-to-apical septal and apical  anterior LV segments. There is mild asymmetric left ventricular hypertrophy of the basal-septal segment. Left ventricular diastolic parameters are indeterminate. The average left ventricular global longitudinal strain is -15.9 %. The global longitudinal strain is abnormal.   2. Right ventricular systolic function is normal. The right ventricular  size is normal. There is normal pulmonary artery systolic pressure. The  estimated right ventricular systolic pressure is 18.1 mmHg.   3. Left atrial size was mildly dilated.   4. The mitral valve is grossly normal. Trivial mitral valve  regurgitation.   5. The aortic valve is tricuspid. There is mild calcification of the  aortic valve. There is mild thickening of the aortic valve. Aortic valve  regurgitation is mild. Aortic valve sclerosis/calcification is present,  without any evidence of aortic stenosis.   6. Aortic dilatation noted. There is borderline dilatation of the  ascending aorta, measuring 36 mm.   7. The inferior vena cava is normal in size with greater than 50%  respiratory variability, suggesting right atrial pressure of 3 mmHg.   8. A trivial pericardial effusion is present posterior to the LV  9. 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 EF appears slightly improved on current study.   Echo 02/10/2015: Study Conclusions   - Left ventricle: Septal apical and distal anterior wall   hypokinesis. The cavity size was mildly dilated. Wall thickness   was normal. The estimated ejection fraction was 45%. - Left atrium: The atrium was mildly dilated. - Atrial septum: There was increased thickness of the septum,   consistent with lipomatous hypertrophy. No defect or patent   foramen ovale was identified.  Recent Labs: No results found for requested labs within last 8760 hours.   11/20/16: Sodium 138, potassium 4.7, BUN 15, creatinine  1.22 AST 25, ALT 23 Total cholesterol 153, triglycerides 97, HDL 41, LDL 95  12/07/2017: Sodium 633, potassium 4.7, BUN 14, creatinine 1.16 AST 34, ALT 34 Total cholesterol 105, triglycerides 59, HDL 39, LDL 53  12/03/2018: Sodium 354, potassium 4.5, BUN 15, creatinine 1.45 AST 22, ALT 20 Total cholesterol 134, triglycerides 86, LDL 70, HDL 45   Lipid Panel    Component Value Date/Time   CHOL 143 10/08/2014 0528   TRIG 88 10/08/2014 0528   HDL 36 (L) 10/08/2014 0528   CHOLHDL 4.0 10/08/2014 0528   VLDL 18 10/08/2014 0528   LDLCALC 89 10/08/2014 0528    Wt Readings from Last 3 Encounters:  09/01/21 155 lb (70.3 kg)  06/06/21 161 lb 6.4 oz (73.2 kg)  06/01/20 157 lb 3.2 oz (71.3 kg)      ASSESSMENT AND PLAN: S/P CABG x 3 He is doing well.  He exercises daily and has no angina.  Continue aspirin, metoprolol and statin.  Lipids are at goal.   Dyslipidemia LDL was 70 on rosuvastatin when checked 04/2021.  Hypertension Blood pressure was unusually high today.  He just got a new machine.  In general it has been very well-controlled.  He is going to track his BP bid for a week and call with the results.  He is also going to check with a different machine to see if the blood pressure cuff is the problem.  Continue with regular exercise.  Chronic combined systolic and diastolic heart failure (HCC) He is euvolemic and doing well.  He is exercising and has no dyspnea.  LVEF has improved to 50 to 55%.  Continue metoprolol.  If his blood pressure is elevated we will need to add an ARB.  He has not been requiring any diuretics.    Current medicines are reviewed at length with the patient today.  The patient does not have concerns regarding medicines.  The following changes have been made:  no change  Labs/ tests ordered today include:   No orders of the defined types were placed in this encounter.   Disposition:   FU with Tiffany C. Duke Salvia, MD, [facility] in 6 months  virtually  Sherin Quarry as a Neurosurgeon for Chilton Si, MD.,have documented all relevant documentation on the behalf of Chilton Si, MD,as directed by  Chilton Si, MD while in the presence of Chilton Si, MD.  I, Tiffany C. Duke Salvia, MD have reviewed all documentation for this visit.  The documentation of the exam, diagnosis, procedures, and orders on 09/01/2021 are all accurate and complete.   Signed, Tiffany C. Duke Salvia, MD, [facility]  09/01/2021 8:18 AM    [facility]

## 2021-09-01 ENCOUNTER — Telehealth (INDEPENDENT_AMBULATORY_CARE_PROVIDER_SITE_OTHER): Payer: Medicare Other | Admitting: Cardiovascular Disease

## 2021-09-01 ENCOUNTER — Encounter (HOSPITAL_BASED_OUTPATIENT_CLINIC_OR_DEPARTMENT_OTHER): Payer: Self-pay | Admitting: Cardiovascular Disease

## 2021-09-01 DIAGNOSIS — E785 Hyperlipidemia, unspecified: Secondary | ICD-10-CM

## 2021-09-01 DIAGNOSIS — I5042 Chronic combined systolic (congestive) and diastolic (congestive) heart failure: Secondary | ICD-10-CM | POA: Diagnosis not present

## 2021-09-01 DIAGNOSIS — Z951 Presence of aortocoronary bypass graft: Secondary | ICD-10-CM

## 2021-09-01 DIAGNOSIS — I10 Essential (primary) hypertension: Secondary | ICD-10-CM

## 2021-09-01 NOTE — Assessment & Plan Note (Signed)
Blood pressure was unusually high today.  He just got a new machine.  In general it has been very well-controlled.  He is going to track his BP bid for a week and call with the results.  He is also going to check with a different machine to see if the blood pressure cuff is the problem.  Continue with regular exercise.

## 2021-09-01 NOTE — Assessment & Plan Note (Signed)
He is doing well.  He exercises daily and has no angina.  Continue aspirin, metoprolol and statin.  Lipids are at goal.

## 2021-09-01 NOTE — Assessment & Plan Note (Addendum)
LDL was 70 on rosuvastatin when checked 04/2021.

## 2021-09-01 NOTE — Assessment & Plan Note (Signed)
He is euvolemic and doing well.  He is exercising and has no dyspnea.  LVEF has improved to 50 to 55%.  Continue metoprolol.  If his blood pressure is elevated we will need to add an ARB.  He has not been requiring any diuretics.

## 2021-09-01 NOTE — Patient Instructions (Signed)
Medication Instructions:  Your physician recommends that you continue on your current medications as directed. Please refer to the Current Medication list given to you today.   *If you need a refill on your cardiac medications before your next appointment, please call your pharmacy*  Lab Work: NONE  Testing/Procedures: NONE  Follow-Up: At [facility], you and your health needs are our priority.  As part of our continuing mission to provide you with exceptional heart care, we have created designated Provider Care Teams.  These Care Teams include your primary Cardiologist (physician) and Advanced Practice Providers (APPs -  Physician Assistants and Nurse Practitioners) who all work together to provide you with the care you need, when you need it.  We recommend signing up for the patient portal called "MyChart".  Sign up information is provided on this After Visit Summary.  MyChart is used to connect with patients for Virtual Visits (Telemedicine).  Patients are able to view lab/test results, encounter notes, upcoming appointments, etc.  Non-urgent messages can be sent to your provider as well.   To learn more about what you can do with MyChart, go to [email or web address].    Your next appointment:   6 month(s)  The format for your next appointment:   In Person  Provider:   Chilton Si, MD{  Other Instructions  TRACK YOUR BLOOD PRESSURE TWICE A DAY FOR A WEEK. CALL OR SEND MYCHART MESSAGE WITH READINGS

## 2021-09-09 ENCOUNTER — Encounter (HOSPITAL_BASED_OUTPATIENT_CLINIC_OR_DEPARTMENT_OTHER): Payer: Self-pay | Admitting: Cardiovascular Disease

## 2021-09-09 NOTE — Telephone Encounter (Signed)
Blood pressure readings for your review! Thanks!

## 2022-03-02 ENCOUNTER — Ambulatory Visit (HOSPITAL_BASED_OUTPATIENT_CLINIC_OR_DEPARTMENT_OTHER): Payer: Medicare Other | Admitting: Cardiovascular Disease

## 2022-03-02 ENCOUNTER — Encounter (HOSPITAL_BASED_OUTPATIENT_CLINIC_OR_DEPARTMENT_OTHER): Payer: Self-pay | Admitting: Cardiovascular Disease

## 2022-03-02 ENCOUNTER — Ambulatory Visit (INDEPENDENT_AMBULATORY_CARE_PROVIDER_SITE_OTHER): Payer: Medicare Other | Admitting: Cardiovascular Disease

## 2022-03-02 VITALS — BP 112/80 | HR 60 | Ht 66.0 in | Wt 167.6 lb

## 2022-03-02 DIAGNOSIS — Z951 Presence of aortocoronary bypass graft: Secondary | ICD-10-CM | POA: Diagnosis not present

## 2022-03-02 DIAGNOSIS — I5042 Chronic combined systolic (congestive) and diastolic (congestive) heart failure: Secondary | ICD-10-CM | POA: Diagnosis not present

## 2022-03-02 DIAGNOSIS — I10 Essential (primary) hypertension: Secondary | ICD-10-CM | POA: Diagnosis not present

## 2022-03-02 DIAGNOSIS — E785 Hyperlipidemia, unspecified: Secondary | ICD-10-CM | POA: Diagnosis not present

## 2022-03-02 NOTE — Telephone Encounter (Signed)
Patient seen by Dr Duke Salvia 8/10

## 2022-03-02 NOTE — Progress Notes (Signed)
Cardiology Office Note  Date:  03/02/2022   ID:  Anthony Vasquez, Anthony Vasquez Oct 07, 1945, MRN [id number]  PCP:  Domenic Moras, DO  Cardiologist:   Chilton Si, MD   No chief complaint on file.   History of Present Illness: Anthony Vasquez is a 76 y.o. male with chronic systolic and diastolic heart failure (LVEF improved from 15% to 45%) , hypertension, hyperlipidemia, CAD s/p CABG who presents for follow up.  Anthony Vasquez was previously a Vasquez of Dr. Patty Sermons.  He was admitted 09/2014 with NSTEMI.  He had a LHC that demonstrated LVEF 15-20% and 3 vessel CAD. He underwent coronary artery bypass grafting with Dr. Donata Clay ( (L-LAD, S-RI, S-OM).  Subsequent echo 10/2014 revealed improvement in his EF to 35-40%. He had a subsequent echo 01/2015 that revealed LVEF 45%. He was not started on an ACE inhibitor due to hypotension.  At his last appointment he moved to [facility], [place].    At his last appointment he was feeling well and exercising regularly.  Blood pressures are well-controlled.  He was referred for repeat echo which revealed LVEF 55% with hypokinesis of Anthony mid to apical septum and apical anterior myocardium.  He had mild asymmetric LVH.  There was a trivial pericardial effusion.  Lately he has been feeling well.  H exercises by walking two miles at a 1% incline on Anthony treadmill daily.  This takes him 30 minutes.  He has no exertional chest pain or shortness of breath.  He denies LE edema, orthopnea or PND. He checks his BP at home twice daily.  His BP has been consistently controlled.  He notes that he could make improvements in his diet.  Past Medical History:  Diagnosis Date   Chronic combined systolic and diastolic heart failure (HCC)    Hypertension    Ischemic cardiomyopathy    Echo 4/16: Mild LVH, EF 35-40%, anteroseptal, apical, inferior, apical anterior anteroapical akinesis, grade 1 diastolic dysfunction, mildly reduced RVSF   Myocardial infarction (HCC) 09/2014   nstemi    Renal disorder     Past Surgical History:  Procedure Laterality Date   COLONOSCOPY  2015   CORONARY ARTERY BYPASS GRAFT N/A 10/08/2014   Procedure: CORONARY ARTERY BYPASS GRAFTING (CABG);  Surgeon: Kerin Perna, MD;  Location: [facility] OR;  Service: Open Heart Surgery;  Laterality: N/A;  Vasquez IS GETTING AN IMPELLA IN Anthony CATH LAB CABG times 3 using left internal mammary artery and endoscopically harvested left saphenous vein.   LEFT HEART CATHETERIZATION WITH CORONARY ANGIOGRAM N/A 10/08/2014   Procedure: LEFT HEART CATHETERIZATION WITH CORONARY ANGIOGRAM;  Surgeon: Corky Crafts, MD;  Location: [facility] CATH LAB;  Service: Cardiovascular;  Laterality: N/A;   TEE WITHOUT CARDIOVERSION N/A 10/08/2014   Procedure: TRANSESOPHAGEAL ECHOCARDIOGRAM (TEE);  Surgeon: Kerin Perna, MD;  Location: [facility] OR;  Service: Open Heart Surgery;  Laterality: N/A;   TONSILLECTOMY       Current Outpatient Medications  Medication Sig Dispense Refill   aspirin 81 MG tablet Take 81 mg by mouth daily.     metoprolol succinate (TOPROL-XL) 25 MG 24 hr tablet Take 0.5 tablets (12.5 mg total) by mouth daily. 45 tablet 3   rosuvastatin (CRESTOR) 40 MG tablet Take 1 tablet (40 mg total) by mouth daily. 90 tablet 3   Vitamin D, Ergocalciferol, (DRISDOL) 50000 UNITS CAPS capsule Take 50,000 Units by mouth every 30 (thirty) days.     No current facility-administered medications for this visit.  Allergies:   Vasquez has no known allergies.    Social History:  Anthony Vasquez  reports that he has never smoked. He has never used smokeless tobacco. He reports that he does not drink alcohol and does not use drugs.   Family History:  Anthony Vasquez's family history includes Healthy in his brother; Heart attack in his mother; Heart disease in his father and mother.    ROS:  Please see Anthony history of present illness.   Otherwise, review of systems are positive for none.   All other systems are reviewed and negative.    PHYSICAL  EXAM: VS:  BP 112/80 (BP Location: Right Arm, Vasquez Position: Sitting, Cuff Size: Normal)   Pulse 60   Ht 5\' 6"  (1.676 m)   Wt 167 lb 9.6 oz (76 kg)   BMI 27.05 kg/m  , BMI Body mass index is 27.05 kg/m. GENERAL:  Well appearing HEENT: Pupils equal round and reactive, fundi not visualized, oral mucosa unremarkable NECK:  No jugular venous distention, waveform within normal limits, carotid upstroke brisk and symmetric, no bruits LUNGS:  Clear to auscultation bilaterally HEART:  RRR.  PMI not displaced or sustained,S1 and S2 within normal limits, no S3, no S4, no clicks, no rubs, no murmurs ABD:  Flat, positive bowel sounds normal in frequency in pitch, no bruits, no rebound, no guarding, no midline pulsatile mass, no hepatomegaly, no splenomegaly EXT:  2 plus pulses throughout, no edema, no cyanosis no clubbing SKIN:  No rashes no nodules NEURO:  Cranial nerves II through XII grossly intact, motor grossly intact throughout PSYCH:  Cognitively intact, oriented to person place and time   EKG:  EKG is ordered today. Anthony ekg ordered 05/30/16 demonstrates sinus rhythm rate 68 bpm.  Prior anteroseptal infarct 05/22/17. Sinus bradycardia.  Rate 54 bpm.  Anteroseptal  infarct.  Lateral T wave flattening. 05/27/2018: Sinus bradycardia.  Rate 52 bpm.  LAFB.  Nonspecific T wave abnormalities. 06/01/20: Sinus rhythm.  Rate 74 bpm.  PVCs.  Prior septal infarct. 06/06/21: Sinus rhythm.  Rate 70 bpm.  Frequent PVCs.    Echo 02/10/2015: Study Conclusions   - Left ventricle: Septal apical and distal anterior wall   hypokinesis. Anthony cavity size was mildly dilated. Wall thickness   was normal. Anthony estimated ejection fraction was 45%. - Left atrium: Anthony atrium was mildly dilated. - Atrial septum: There was increased thickness of Anthony septum,   consistent with lipomatous hypertrophy. No defect or patent   foramen ovale was identified.  Echo 08/01/21: IMPRESSIONS    1. Left ventricular ejection  fraction, by estimation, is 50 to 55%. Anthony  left ventricle has low normal function. Anthony left ventricle demonstrates  regional wall motion abnormalities (see scoring diagram/findings for  description). There is hypokinesis of  Anthony mid-to-apical septal and apical anterior LV segments. There is mild  asymmetric left ventricular hypertrophy of Anthony basal-septal segment. Left  ventricular diastolic parameters are indeterminate. Anthony average left  ventricular global longitudinal strain  is -15.9 %. Anthony global longitudinal strain is abnormal.   2. Right ventricular systolic function is normal. Anthony right ventricular  size is normal. There is normal pulmonary artery systolic pressure. Anthony  estimated right ventricular systolic pressure is 18.1 mmHg.   3. Left atrial size was mildly dilated.   4. Anthony mitral valve is grossly normal. Trivial mitral valve  regurgitation.   5. Anthony aortic valve is tricuspid. There is mild calcification of Anthony  aortic valve. There is mild thickening of  Anthony aortic valve. Aortic valve  regurgitation is mild. Aortic valve sclerosis/calcification is present,  without any evidence of aortic  stenosis.   6. Aortic dilatation noted. There is borderline dilatation of Anthony  ascending aorta, measuring 36 mm.   7. Anthony inferior vena cava is normal in size with greater than 50%  respiratory variability, suggesting right atrial pressure of 3 mmHg.   8. A trivial pericardial effusion is present posterior to Anthony LV   9. Hepatic cysts noted on subcostal view  Recent Labs: No results found for requested labs within last 365 days.   11/20/16: Sodium 138, potassium 4.7, BUN 15, creatinine 1.22 AST 25, ALT 23 Total cholesterol 153, triglycerides 97, HDL 41, LDL 95  12/07/2017: Sodium 258, potassium 4.7, BUN 14, creatinine 1.16 AST 34, ALT 34 Total cholesterol 105, triglycerides 59, HDL 39, LDL 53  12/03/2018: Sodium 527, potassium 4.5, BUN 15, creatinine 1.45 AST 22, ALT 20 Total  cholesterol 134, triglycerides 86, LDL 70, HDL 45   Lipid Panel    Component Value Date/Time   CHOL 143 10/08/2014 0528   TRIG 88 10/08/2014 0528   HDL 36 (L) 10/08/2014 0528   CHOLHDL 4.0 10/08/2014 0528   VLDL 18 10/08/2014 0528   LDLCALC 89 10/08/2014 0528    Wt Readings from Last 3 Encounters:  03/02/22 167 lb 9.6 oz (76 kg)  09/01/21 155 lb (70.3 kg)  06/06/21 161 lb 6.4 oz (73.2 kg)      ASSESSMENT AND PLAN:  # CAD s/p CABG:  # Hyperlipidemia:  Anthony Vasquez continues to do well.  He is exercising almost every day and has no exertional symptoms.  He is euvolemic on exam.  Continue aspirin, metoprolol, and rosuvastatin.  He had lipids checked with his PCP recently.  We will get a copy.  Continue aspirin, metoprolol, and rosuvastatin.  LDL slightly above goal.  He will work on limiting fried foods, fatty foods, red meat, and cheese.  Repeat lipids/CMP 05/2022.  # Chronic systolic and diastolic heart failure:  # PVCs:  Systolic function has normalized.  He denies palpitations and no PVCs on exam today. He was previously on an ACE inhibitor but developed hypotension.  Continue metoprolol.  # Hypertension: Blood pressure is well-controlled on metoprolol.    Current medicines are reviewed at length with Anthony Vasquez today.  Anthony Vasquez does not have concerns regarding medicines.  Anthony following changes have been made:  no change  Labs/ tests ordered today include:   No orders of Anthony defined types were placed in this encounter.     Disposition:   FU with Turrell Severt C. Duke Salvia, MD, [facility] in 1 year in person     Signed, Elmarie Shiley C. Duke Salvia, MD, [facility]  03/02/2022 2:38 PM    [facility]

## 2022-03-02 NOTE — Patient Instructions (Signed)
Medication Instructions:  Your physician recommends that you continue on your current medications as directed. Please refer to the Current Medication list given to you today.   *If you need a refill on your cardiac medications before your next appointment, please call your pharmacy*  Lab Work: [facility] IN [place]   If you have labs (blood work) drawn today and your tests are completely normal, you will receive your results only by: MyChart Message (if you have MyChart) OR A paper copy in the mail If you have any lab test that is abnormal or we need to change your treatment, we will call you to review the results.  Testing/Procedures: NONE   Follow-Up: At [facility], you and your health needs are our priority.  As part of our continuing mission to provide you with exceptional heart care, we have created designated Provider Care Teams.  These Care Teams include your primary Cardiologist (physician) and Advanced Practice Providers (APPs -  Physician Assistants and Nurse Practitioners) who all work together to provide you with the care you need, when you need it.  We recommend signing up for the patient portal called "MyChart".  Sign up information is provided on this After Visit Summary.  MyChart is used to connect with patients for Virtual Visits (Telemedicine).  Patients are able to view lab/test results, encounter notes, upcoming appointments, etc.  Non-urgent messages can be sent to your provider as well.   To learn more about what you can do with MyChart, go to [email or web address].    Your next appointment:   12 month(s)  The format for your next appointment:   In Person  Provider:   Chilton Si, MD{  Other Instructions Heart Healthy Diet Recommendations: A low-salt diet is recommended. Meats should be grilled, baked, or boiled. Avoid fried foods. Focus on lean protein sources like fish or chicken with vegetables and fruits. The American Heart Association is a  Chief Technology Officer!  American Heart Association Diet and Lifeystyle Recommendations  LIMIT FRIED FOODS, GREASY FOODS, AND CHEESE

## 2022-03-03 ENCOUNTER — Encounter (HOSPITAL_BASED_OUTPATIENT_CLINIC_OR_DEPARTMENT_OTHER): Payer: Self-pay | Admitting: Cardiovascular Disease

## 2022-06-08 LAB — COMPREHENSIVE METABOLIC PANEL
ALT: 22 IU/L (ref 0–44)
AST: 28 IU/L (ref 0–40)
Albumin/Globulin Ratio: 1.7 (ref 1.2–2.2)
Albumin: 4.6 g/dL (ref 3.8–4.8)
Alkaline Phosphatase: 65 IU/L (ref 44–121)
BUN/Creatinine Ratio: 8 — ABNORMAL LOW (ref 10–24)
BUN: 11 mg/dL (ref 8–27)
Bilirubin Total: 0.6 mg/dL (ref 0.0–1.2)
CO2: 23 mmol/L (ref 20–29)
Calcium: 9.7 mg/dL (ref 8.6–10.2)
Chloride: 104 mmol/L (ref 96–106)
Creatinine, Ser: 1.34 mg/dL — ABNORMAL HIGH (ref 0.76–1.27)
Globulin, Total: 2.7 g/dL (ref 1.5–4.5)
Glucose: 79 mg/dL (ref 70–99)
Potassium: 4.4 mmol/L (ref 3.5–5.2)
Sodium: 141 mmol/L (ref 134–144)
Total Protein: 7.3 g/dL (ref 6.0–8.5)
eGFR: 55 mL/min/{1.73_m2} — ABNORMAL LOW (ref >59)

## 2022-06-08 LAB — LIPID PANEL
Chol/HDL Ratio: 3.1 ratio (ref 0.0–5.0)
Cholesterol, Total: 123 mg/dL (ref 100–199)
HDL: 40 mg/dL (ref >39)
LDL Chol Calc (NIH): 66 mg/dL (ref 0–99)
Triglycerides: 86 mg/dL (ref 0–149)
VLDL Cholesterol Cal: 17 mg/dL (ref 5–40)

## 2023-02-27 ENCOUNTER — Ambulatory Visit (HOSPITAL_BASED_OUTPATIENT_CLINIC_OR_DEPARTMENT_OTHER): Payer: Medicare Other | Admitting: Family

## 2023-03-27 ENCOUNTER — Ambulatory Visit (HOSPITAL_BASED_OUTPATIENT_CLINIC_OR_DEPARTMENT_OTHER): Payer: Medicare Other | Admitting: Cardiovascular Disease

## 2023-05-15 ENCOUNTER — Encounter (HOSPITAL_BASED_OUTPATIENT_CLINIC_OR_DEPARTMENT_OTHER): Payer: Self-pay | Admitting: Family

## 2023-05-15 ENCOUNTER — Ambulatory Visit (HOSPITAL_BASED_OUTPATIENT_CLINIC_OR_DEPARTMENT_OTHER): Payer: Medicare Other | Admitting: Family

## 2023-05-15 VITALS — BP 134/82 | HR 57 | Resp 16 | Ht 66.0 in | Wt 158.1 lb

## 2023-05-15 DIAGNOSIS — I10 Essential (primary) hypertension: Secondary | ICD-10-CM | POA: Diagnosis not present

## 2023-05-15 DIAGNOSIS — I25118 Atherosclerotic heart disease of native coronary artery with other forms of angina pectoris: Secondary | ICD-10-CM | POA: Diagnosis not present

## 2023-05-15 DIAGNOSIS — I5022 Chronic systolic (congestive) heart failure: Secondary | ICD-10-CM

## 2023-05-15 DIAGNOSIS — E785 Hyperlipidemia, unspecified: Secondary | ICD-10-CM

## 2023-05-15 DIAGNOSIS — Z951 Presence of aortocoronary bypass graft: Secondary | ICD-10-CM

## 2023-05-15 NOTE — Progress Notes (Signed)
Cardiology Office Note:  .   Date:  05/15/2023  ID:  CROMWELL MCEVILLY, DOB 1946/04/16, MRN [id number] PCP: Comianos, Pennie Banter, DO  [facility] Providers Cardiologist:  Chilton Si, MD    History of Present Illness: ASHAR CARMEL is a 77 y.o. male with history of chronic systolic and diastolic heart failure (LVEF improved on 50% to 45%), HTN, HLD, CAD s/p CABG.  09/2014 with NSTEMI.  LHC demonstrated LVEF 15 to 20% and three-vessel CAD.  Underwent CABG X3 with Dr. Maren Beach L-LAD, S-RI, S-OM. Subsequent echo 10/2014 LVEF 35-40%. Echo 01/2015 LVEF 45%. No ACE due to hypotension.  Echo 08/01/2021 LVEF 55%, hypokinesis of mid to apical septum and apical anterior myocardium, mild asymmetric LVH, trivial pericardial effusion.  Last seen 03/02/22 doing well from a cardiac perspective. He had moved to [facility] and preferred to keep cardiology care with our office and come back once a year for office visit.  No changes were made at that time.  Presents today for follow-up independently.  Enjoys spending time with his 3 children.  Feeling well since last seen.  Walking for exercise 2 miles which takes 35 minutes with no exertional symptoms. BP at home prior to walking 140s then after walking 120-130s. Reports no shortness of breath nor dyspnea on exertion. Reports no chest pain, pressure, or tightness. No edema, orthopnea, PND. Reports no palpitations.    ROS: Please see the history of present illness.    All other systems reviewed and are negative.   Studies Reviewed: Marland Kitchen   EKG Interpretation Date/Time:  Tuesday May 15 2023 14:50:16 EDT Ventricular Rate:  57 PR Interval:  192 QRS Duration:  92 QT Interval:  396 QTC Calculation: 385 R Axis:   -31  Text Interpretation: Sinus bradycardia Left axis deviation Nonspecific T wave abnormality No acute changes Confirmed by Gillian Shields ([id number]) on 05/15/2023 5:16:19 PM    Cardiac Studies & Procedures        ECHOCARDIOGRAM  ECHOCARDIOGRAM COMPLETE 08/01/2021  Narrative ECHOCARDIOGRAM REPORT    Patient Name:   NGUYEN RUNNELLS Date of Exam: 08/01/2021 Medical Rec #:  [id number]      Height:       66.0 in Accession #:    [id number]     Weight:       161.4 lb Date of Birth:  [birth year]      BSA:          1.826 m Patient Age:    75 years       BP:           142/68 mmHg Patient Gender: M              HR:           60 bpm. Exam Location:  Outpatient  Procedure: 2D Echo, Cardiac Doppler, Color Doppler and Strain Analysis  Indications:    I50.42 Chronic combined systolic (congestive) and diastolic (congestive) heart failure  History:        Patient has prior history of Echocardiogram examinations, most recent 02/10/2015. Cardiomyopathy and CHF, Previous Myocardial Infarction and CAD, Prior CABG, Arrythmias:PVC; Risk Factors:Family History of Coronary Artery Disease, Hypertension, Dyslipidemia, Non-Smoker and CKD stage 3. Patient denies chest pain, SOB and leg edema.  Sonographer:    Carlos American RVT, RDCS (AE), RDMS Referring Phys: [id number] TIFFANY [place]  IMPRESSIONS   1. Left ventricular ejection fraction, by estimation, is 50 to 55%. The left ventricle has low normal function.  The left ventricle demonstrates regional wall motion abnormalities (see scoring diagram/findings for description). There is hypokinesis of the mid-to-apical septal and apical anterior LV segments. There is mild asymmetric left ventricular hypertrophy of the basal-septal segment. Left ventricular diastolic parameters are indeterminate. The average left ventricular global longitudinal strain is -15.9 %. The global longitudinal strain is abnormal. 2. Right ventricular systolic function is normal. The right ventricular size is normal. There is normal pulmonary artery systolic pressure. The estimated right ventricular systolic pressure is 18.1 mmHg. 3. Left atrial size was mildly dilated. 4. The mitral valve is grossly  normal. Trivial mitral valve regurgitation. 5. The aortic valve is tricuspid. There is mild calcification of the aortic valve. There is mild thickening of the aortic valve. Aortic valve regurgitation is mild. Aortic valve sclerosis/calcification is present, without any evidence of aortic stenosis. 6. Aortic dilatation noted. There is borderline dilatation of the ascending aorta, measuring 36 mm. 7. The inferior vena cava is normal in size with greater than 50% respiratory variability, suggesting right atrial pressure of 3 mmHg. 8. A trivial pericardial effusion is present posterior to the LV 9. Hepatic cysts noted on subcostal view  Comparison(s): Prior TTE in 2016 with EF 45%, septal apical and distal anterior wall hypokinesis. EF appears slightly improved on current study.  FINDINGS Left Ventricle: Left ventricular ejection fraction, by estimation, is 50 to 55%. The left ventricle has low normal function. The left ventricle demonstrates regional wall motion abnormalities. There is hypokinesis of the mid-to-apical septal and apical anterior. The average left ventricular global longitudinal strain is -15.9 %. The global longitudinal strain is abnormal. 3D left ventricular ejection fraction analysis performed but not reported based on interpreter judgement due to suboptimal tracking. The left ventricular internal cavity size was normal in size. There is mild asymmetric left ventricular hypertrophy of the basal-septal segment. Left ventricular diastolic parameters are indeterminate.  Right Ventricle: The right ventricular size is normal. No increase in right ventricular wall thickness. Right ventricular systolic function is normal. There is normal pulmonary artery systolic pressure. The tricuspid regurgitant velocity is 1.94 m/s, and with an assumed right atrial pressure of 3 mmHg, the estimated right ventricular systolic pressure is 18.1 mmHg.  Left Atrium: Left atrial size was mildly  dilated.  Right Atrium: Right atrial size was normal in size.  Pericardium: Trivial pericardial effusion is present. The pericardial effusion is posterior to the left ventricle.  Mitral Valve: The mitral valve is grossly normal. There is mild thickening of the mitral valve leaflet(s). There is mild calcification of the mitral valve leaflet(s). Mild to moderate mitral annular calcification. Trivial mitral valve regurgitation.  Tricuspid Valve: The tricuspid valve is normal in structure. Tricuspid valve regurgitation is trivial.  Aortic Valve: The aortic valve is tricuspid. There is mild calcification of the aortic valve. There is mild thickening of the aortic valve. Aortic valve regurgitation is mild. Aortic valve sclerosis/calcification is present, without any evidence of aortic stenosis. Aortic valve mean gradient measures 5.0 mmHg. Aortic valve peak gradient measures 11.0 mmHg. Aortic valve area, by VTI measures 2.28 cm.  Pulmonic Valve: The pulmonic valve was normal in structure. Pulmonic valve regurgitation is mild.  Aorta: Aortic dilatation noted. There is borderline dilatation of the ascending aorta, measuring 36 mm.  Venous: The inferior vena cava is normal in size with greater than 50% respiratory variability, suggesting right atrial pressure of 3 mmHg.  IAS/Shunts: The atrial septum is grossly normal.   LEFT VENTRICLE PLAX 2D LVIDd:  5.41 cm      Diastology LVIDs:         4.03 cm      LV e' medial:    5.93 cm/s LV PW:         0.94 cm      LV E/e' medial:  20.9 LV IVS:        0.94 cm      LV e' lateral:   9.93 cm/s LVOT diam:     1.90 cm      LV E/e' lateral: 12.5 LV SV:         79 LV SV Index:   43           2D Longitudinal Strain LVOT Area:     2.84 cm     2D Strain GLS Avg:     -15.9 %  LV Volumes (MOD) LV vol d, MOD A2C: 73.9 ml  3D Volume EF: LV vol d, MOD A4C: 105.0 ml 3D EF:        58 % LV vol s, MOD A2C: 34.6 ml  LV EDV:       131 ml LV vol s, MOD A4C:  47.3 ml  LV ESV:       54 ml LV SV MOD A2C:     39.3 ml  LV SV:        77 ml LV SV MOD A4C:     105.0 ml LV SV MOD BP:      50.5 ml  RIGHT VENTRICLE RV S prime:     14.10 cm/s TAPSE (M-mode): 2.1 cm  LEFT ATRIUM             Index        RIGHT ATRIUM           Index LA diam:        4.20 cm 2.30 cm/m   RA Area:     17.60 cm LA Vol (A2C):   59.6 ml 32.65 ml/m  RA Volume:   44.70 ml  24.48 ml/m LA Vol (A4C):   57.9 ml 31.72 ml/m LA Biplane Vol: 58.3 ml 31.93 ml/m AORTIC VALVE                     PULMONIC VALVE AV Area (Vmax):    2.25 cm      PV Vmax:          1.56 m/s AV Area (Vmean):   1.94 cm      PV Peak grad:     9.8 mmHg AV Area (VTI):     2.28 cm      PR End Diast Vel: 3.61 msec AV Vmax:           166.00 cm/s AV Vmean:          109.000 cm/s AV VTI:            0.345 m AV Peak Grad:      11.0 mmHg AV Mean Grad:      5.0 mmHg LVOT Vmax:         132.00 cm/s LVOT Vmean:        74.500 cm/s LVOT VTI:          0.277 m LVOT/AV VTI ratio: 0.80 AR Vena Contracta: 0.24 cm  AORTA Ao Root diam: 3.30 cm Ao Asc diam:  3.60 cm Ao Arch diam: 3.0 cm  MITRAL VALVE  TRICUSPID VALVE MV Area (PHT): 3.05 cm     TR Peak grad:   15.1 mmHg MV Decel Time: 249 msec     TR Vmax:        194.00 cm/s MV E velocity: 124.00 cm/s MV A velocity: 99.60 cm/s   SHUNTS MV E/A ratio:  1.24         Systemic VTI:  0.28 m Systemic Diam: 1.90 cm  Laurance Flatten MD Electronically signed by Laurance Flatten MD Signature Date/Time: 08/01/2021/4:06:06 PM    Final             Risk Assessment/Calculations:             Physical Exam:   VS:  BP 134/82   Pulse (!) 57   Resp 16   Ht 5\' 6"  (1.676 m)   Wt 158 lb 1.6 oz (71.7 kg)   SpO2 98%   BMI 25.52 kg/m    Wt Readings from Last 3 Encounters:  05/15/23 158 lb 1.6 oz (71.7 kg)  03/02/22 167 lb 9.6 oz (76 kg)  09/01/21 155 lb (70.3 kg)    Vitals:   05/15/23 1446 05/15/23 1530  BP: (!) 140/88 134/82  Pulse: (!) 57   Resp: 16    Height: 5\' 6"  (1.676 m)   Weight: 158 lb 1.6 oz (71.7 kg)   SpO2: 98%   BMI (Calculated): 25.53     GEN: Well nourished, well developed in no acute distress NECK: No JVD; No carotid bruits CARDIAC: RRR, no murmurs, rubs, gallops RESPIRATORY:  Clear to auscultation without rales, wheezing or rhonchi  ABDOMEN: Soft, non-tender, non-distended EXTREMITIES:  No edema; No deformity   ASSESSMENT AND PLAN: .    CAD s/p CABG/hyperlipidemia, LDL goal less than 70- Stable with no anginal symptoms. No indication for ischemic evaluation.  GDMT aspirin, metoprolol, rosuvastatin.. Lipid panel 11/10/2022 LDL 62. Recommend aiming for 150 minutes of moderate intensity activity per week and following a heart healthy diet.    Systolic diastolic heart failure/PVC- Systolic function has normalized. Euvolemic and well compensated on exam. No palpitations.  No PVC on EKG today.  Continue metoprolol.  HTN- BP initially elevated in clinic 140/88 improved to 134/82 with intervention.  Reports home BP after exercise routinely 120s-130s..  Lisinopril previously discontinued due to hypotension.  Continue Toprol 12.5 mg daily.  If he notes BP persistently at home 130-140s he will contact us and could consider again trialing low-dose of lisinopril.      Dispo: follow up in 1 year  Signed, Alver Sorrow, NP

## 2023-05-15 NOTE — Patient Instructions (Addendum)
Medication Instructions:  Your physician recommends that you continue on your current medications as directed. Please refer to the Current Medication list given to you today.  *If you need a refill on your cardiac medications before your next appointment, please call your pharmacy*  Follow-Up: At [facility], you and your health needs are our priority.  As part of our continuing mission to provide you with exceptional heart care, we have created designated Provider Care Teams.  These Care Teams include your primary Cardiologist (physician) and Advanced Practice Providers (APPs -  Physician Assistants and Nurse Practitioners) who all work together to provide you with the care you need, when you need it.  We recommend signing up for the patient portal called "MyChart".  Sign up information is provided on this After Visit Summary.  MyChart is used to connect with patients for Virtual Visits (Telemedicine).  Patients are able to view lab/test results, encounter notes, upcoming appointments, etc.  Non-urgent messages can be sent to your provider as well.   To learn more about what you can do with MyChart, go to [email or web address].    Your next appointment:   Follow up with Dr. Duke Salvia or Gillian Shields, NP in 1 year  Other Instructions: Your EKG today looked good!  If your blood pressure is consistently more than 130/80 after your walk please let us know and we may consider adding additional medications.
